# Patient Record
Sex: Female | Born: 1935 | Race: White | Hispanic: No | Smoking: Never smoker
Health system: Southern US, Community
[De-identification: ages and names within clinical notes are randomized; demographics above are authoritative.]

## PROBLEM LIST (undated history)

## (undated) DIAGNOSIS — M545 Low back pain, unspecified: Secondary | ICD-10-CM

## (undated) DIAGNOSIS — E785 Hyperlipidemia, unspecified: Secondary | ICD-10-CM

## (undated) DIAGNOSIS — I10 Essential (primary) hypertension: Secondary | ICD-10-CM

## (undated) DIAGNOSIS — F329 Major depressive disorder, single episode, unspecified: Secondary | ICD-10-CM

## (undated) DIAGNOSIS — F32A Depression, unspecified: Secondary | ICD-10-CM

## (undated) DIAGNOSIS — F419 Anxiety disorder, unspecified: Secondary | ICD-10-CM

## (undated) DIAGNOSIS — M8580 Other specified disorders of bone density and structure, unspecified site: Secondary | ICD-10-CM

## (undated) DIAGNOSIS — I491 Atrial premature depolarization: Secondary | ICD-10-CM

## (undated) DIAGNOSIS — H3530 Unspecified macular degeneration: Secondary | ICD-10-CM

## (undated) DIAGNOSIS — T7840XA Allergy, unspecified, initial encounter: Secondary | ICD-10-CM

## (undated) DIAGNOSIS — K219 Gastro-esophageal reflux disease without esophagitis: Secondary | ICD-10-CM

## (undated) DIAGNOSIS — K5792 Diverticulitis of intestine, part unspecified, without perforation or abscess without bleeding: Secondary | ICD-10-CM

## (undated) HISTORY — DX: Low back pain: M54.5

## (undated) HISTORY — DX: Other specified disorders of bone density and structure, unspecified site: M85.80

## (undated) HISTORY — DX: Essential (primary) hypertension: I10

## (undated) HISTORY — DX: Gastro-esophageal reflux disease without esophagitis: K21.9

## (undated) HISTORY — DX: Hyperlipidemia, unspecified: E78.5

## (undated) HISTORY — DX: Major depressive disorder, single episode, unspecified: F32.9

## (undated) HISTORY — PX: BACK SURGERY: SHX140

## (undated) HISTORY — DX: Depression, unspecified: F32.A

## (undated) HISTORY — DX: Atrial premature depolarization: I49.1

## (undated) HISTORY — DX: Anxiety disorder, unspecified: F41.9

## (undated) HISTORY — DX: Low back pain, unspecified: M54.50

## (undated) HISTORY — DX: Allergy, unspecified, initial encounter: T78.40XA

## (undated) HISTORY — DX: Diverticulitis of intestine, part unspecified, without perforation or abscess without bleeding: K57.92

---

## 1998-08-14 ENCOUNTER — Other Ambulatory Visit: Admission: RE | Admit: 1998-08-14 | Discharge: 1998-08-14 | Payer: Self-pay | Admitting: *Deleted

## 2000-03-28 ENCOUNTER — Other Ambulatory Visit: Admission: RE | Admit: 2000-03-28 | Discharge: 2000-03-28 | Payer: Self-pay | Admitting: *Deleted

## 2003-01-16 ENCOUNTER — Encounter: Payer: Self-pay | Admitting: Emergency Medicine

## 2003-01-16 ENCOUNTER — Emergency Department (HOSPITAL_COMMUNITY): Admission: EM | Admit: 2003-01-16 | Discharge: 2003-01-17 | Payer: Self-pay | Admitting: Emergency Medicine

## 2003-01-20 ENCOUNTER — Emergency Department (HOSPITAL_COMMUNITY): Admission: EM | Admit: 2003-01-20 | Discharge: 2003-01-20 | Payer: Self-pay | Admitting: Emergency Medicine

## 2003-01-26 ENCOUNTER — Encounter: Payer: Self-pay | Admitting: Internal Medicine

## 2003-01-26 ENCOUNTER — Ambulatory Visit (HOSPITAL_COMMUNITY): Admission: RE | Admit: 2003-01-26 | Discharge: 2003-01-26 | Payer: Self-pay | Admitting: Internal Medicine

## 2003-08-12 ENCOUNTER — Other Ambulatory Visit: Admission: RE | Admit: 2003-08-12 | Discharge: 2003-08-12 | Payer: Self-pay | Admitting: Gynecology

## 2004-08-02 HISTORY — PX: CHOLECYSTECTOMY: SHX55

## 2005-02-01 ENCOUNTER — Ambulatory Visit: Payer: Self-pay | Admitting: Internal Medicine

## 2005-02-16 ENCOUNTER — Ambulatory Visit: Payer: Self-pay | Admitting: Internal Medicine

## 2005-03-03 ENCOUNTER — Ambulatory Visit: Payer: Self-pay | Admitting: Internal Medicine

## 2005-03-04 ENCOUNTER — Ambulatory Visit: Payer: Self-pay | Admitting: Internal Medicine

## 2005-03-05 ENCOUNTER — Ambulatory Visit: Payer: Self-pay | Admitting: Internal Medicine

## 2005-04-15 ENCOUNTER — Ambulatory Visit: Payer: Self-pay | Admitting: Internal Medicine

## 2005-05-28 ENCOUNTER — Ambulatory Visit: Payer: Self-pay | Admitting: Internal Medicine

## 2005-08-11 ENCOUNTER — Ambulatory Visit (HOSPITAL_COMMUNITY): Admission: RE | Admit: 2005-08-11 | Discharge: 2005-08-12 | Payer: Self-pay | Admitting: Surgery

## 2005-08-11 ENCOUNTER — Encounter (INDEPENDENT_AMBULATORY_CARE_PROVIDER_SITE_OTHER): Payer: Self-pay | Admitting: Specialist

## 2005-09-01 ENCOUNTER — Encounter: Admission: RE | Admit: 2005-09-01 | Discharge: 2005-09-01 | Payer: Self-pay | Admitting: Surgery

## 2005-10-25 ENCOUNTER — Ambulatory Visit: Payer: Self-pay | Admitting: Internal Medicine

## 2006-01-03 ENCOUNTER — Ambulatory Visit: Payer: Self-pay | Admitting: Internal Medicine

## 2006-01-31 ENCOUNTER — Other Ambulatory Visit: Admission: RE | Admit: 2006-01-31 | Discharge: 2006-01-31 | Payer: Self-pay | Admitting: Gynecology

## 2006-05-24 ENCOUNTER — Ambulatory Visit: Payer: Self-pay | Admitting: Internal Medicine

## 2006-05-24 LAB — CONVERTED CEMR LAB
Albumin: 3.7 g/dL (ref 3.5–5.2)
CO2: 29 meq/L (ref 19–32)
Chol/HDL Ratio, serum: 3.5
Creatinine, Ser: 1 mg/dL (ref 0.4–1.2)
GFR calc non Af Amer: 57 mL/min
Ketones, ur: NEGATIVE mg/dL
LDL Cholesterol: 79 mg/dL (ref 0–99)
Nitrite: NEGATIVE
Sodium: 142 meq/L (ref 135–145)
Specific Gravity, Urine: 1.01 (ref 1.000–1.03)
Total Bilirubin: 1 mg/dL (ref 0.3–1.2)
Total Protein, Urine: NEGATIVE mg/dL
Urine Glucose: NEGATIVE mg/dL

## 2006-06-07 ENCOUNTER — Ambulatory Visit: Payer: Self-pay | Admitting: Internal Medicine

## 2006-08-02 HISTORY — PX: TOTAL HIP ARTHROPLASTY: SHX124

## 2006-10-14 ENCOUNTER — Ambulatory Visit: Payer: Self-pay | Admitting: Internal Medicine

## 2006-11-16 ENCOUNTER — Inpatient Hospital Stay (HOSPITAL_COMMUNITY): Admission: RE | Admit: 2006-11-16 | Discharge: 2006-11-19 | Payer: Self-pay | Admitting: Orthopedic Surgery

## 2007-05-02 ENCOUNTER — Ambulatory Visit: Payer: Self-pay | Admitting: Internal Medicine

## 2007-07-28 ENCOUNTER — Encounter: Payer: Self-pay | Admitting: Internal Medicine

## 2007-09-07 ENCOUNTER — Telehealth: Payer: Self-pay | Admitting: Internal Medicine

## 2007-09-26 ENCOUNTER — Ambulatory Visit: Payer: Self-pay | Admitting: Internal Medicine

## 2007-09-26 DIAGNOSIS — I10 Essential (primary) hypertension: Secondary | ICD-10-CM | POA: Insufficient documentation

## 2007-09-26 DIAGNOSIS — M1990 Unspecified osteoarthritis, unspecified site: Secondary | ICD-10-CM | POA: Insufficient documentation

## 2007-09-26 DIAGNOSIS — M949 Disorder of cartilage, unspecified: Secondary | ICD-10-CM

## 2007-09-26 DIAGNOSIS — M545 Low back pain: Secondary | ICD-10-CM

## 2007-09-26 DIAGNOSIS — M899 Disorder of bone, unspecified: Secondary | ICD-10-CM | POA: Insufficient documentation

## 2007-09-26 DIAGNOSIS — F4321 Adjustment disorder with depressed mood: Secondary | ICD-10-CM

## 2007-11-15 ENCOUNTER — Encounter: Admission: RE | Admit: 2007-11-15 | Discharge: 2007-11-15 | Payer: Self-pay | Admitting: Orthopedic Surgery

## 2007-12-04 ENCOUNTER — Ambulatory Visit: Payer: Self-pay | Admitting: Internal Medicine

## 2008-01-02 ENCOUNTER — Ambulatory Visit: Payer: Self-pay | Admitting: Internal Medicine

## 2008-01-02 DIAGNOSIS — E785 Hyperlipidemia, unspecified: Secondary | ICD-10-CM | POA: Insufficient documentation

## 2008-01-02 DIAGNOSIS — F411 Generalized anxiety disorder: Secondary | ICD-10-CM | POA: Insufficient documentation

## 2008-01-02 DIAGNOSIS — K219 Gastro-esophageal reflux disease without esophagitis: Secondary | ICD-10-CM

## 2008-01-23 ENCOUNTER — Inpatient Hospital Stay (HOSPITAL_COMMUNITY): Admission: RE | Admit: 2008-01-23 | Discharge: 2008-01-26 | Payer: Self-pay | Admitting: Orthopedic Surgery

## 2008-04-02 ENCOUNTER — Ambulatory Visit: Payer: Self-pay | Admitting: Internal Medicine

## 2008-04-26 ENCOUNTER — Ambulatory Visit: Payer: Self-pay | Admitting: Internal Medicine

## 2008-08-21 ENCOUNTER — Encounter: Payer: Self-pay | Admitting: Internal Medicine

## 2008-09-23 ENCOUNTER — Ambulatory Visit: Payer: Self-pay | Admitting: Internal Medicine

## 2008-09-25 LAB — CONVERTED CEMR LAB
Alkaline Phosphatase: 63 units/L (ref 39–117)
Basophils Absolute: 0 10*3/uL (ref 0.0–0.1)
Bilirubin Urine: NEGATIVE
Bilirubin, Direct: 0.2 mg/dL (ref 0.0–0.3)
Eosinophils Absolute: 0.1 10*3/uL (ref 0.0–0.7)
GFR calc Af Amer: 78 mL/min
GFR calc non Af Amer: 65 mL/min
Glucose, Bld: 108 mg/dL — ABNORMAL HIGH (ref 70–99)
HCT: 38.7 % (ref 36.0–46.0)
HDL: 56.3 mg/dL (ref >39.0)
Hemoglobin, Urine: NEGATIVE
MCHC: 35.1 g/dL (ref 30.0–36.0)
Monocytes Absolute: 0.5 10*3/uL (ref 0.1–1.0)
Monocytes Relative: 7.6 % (ref 3.0–12.0)
Nitrite: NEGATIVE
Platelets: 216 10*3/uL (ref 150–400)
Potassium: 3.6 meq/L (ref 3.5–5.1)
RDW: 11.7 % (ref 11.5–14.6)
Sodium: 142 meq/L (ref 135–145)
TSH: 3.05 microintl units/mL (ref 0.35–5.50)
Total Bilirubin: 1.2 mg/dL (ref 0.3–1.2)
Total CHOL/HDL Ratio: 2.8
Total Protein, Urine: NEGATIVE mg/dL
Triglycerides: 284 mg/dL (ref 0–149)

## 2008-09-26 ENCOUNTER — Ambulatory Visit: Payer: Self-pay | Admitting: Internal Medicine

## 2008-09-26 DIAGNOSIS — J309 Allergic rhinitis, unspecified: Secondary | ICD-10-CM | POA: Insufficient documentation

## 2008-10-21 ENCOUNTER — Encounter: Payer: Self-pay | Admitting: Internal Medicine

## 2008-10-24 ENCOUNTER — Telehealth (INDEPENDENT_AMBULATORY_CARE_PROVIDER_SITE_OTHER): Payer: Self-pay | Admitting: *Deleted

## 2009-01-20 ENCOUNTER — Telehealth: Payer: Self-pay | Admitting: Internal Medicine

## 2009-02-04 ENCOUNTER — Encounter: Payer: Self-pay | Admitting: Internal Medicine

## 2009-02-04 ENCOUNTER — Telehealth: Payer: Self-pay | Admitting: Internal Medicine

## 2009-02-05 ENCOUNTER — Encounter: Payer: Self-pay | Admitting: Internal Medicine

## 2009-03-20 ENCOUNTER — Ambulatory Visit: Payer: Self-pay | Admitting: Internal Medicine

## 2009-03-20 DIAGNOSIS — T887XXA Unspecified adverse effect of drug or medicament, initial encounter: Secondary | ICD-10-CM | POA: Insufficient documentation

## 2009-03-20 LAB — CONVERTED CEMR LAB
AST: 27 units/L (ref 0–37)
Albumin: 4.1 g/dL (ref 3.5–5.2)
Alkaline Phosphatase: 67 units/L (ref 39–117)
Bilirubin, Direct: 0.2 mg/dL (ref 0.0–0.3)
CO2: 27 meq/L (ref 19–32)
Chloride: 99 meq/L (ref 96–112)
Glucose, Bld: 91 mg/dL (ref 70–99)
LDL Cholesterol: 76 mg/dL (ref 0–99)
Potassium: 4.1 meq/L (ref 3.5–5.1)
Sodium: 135 meq/L (ref 135–145)
Total Protein: 7.2 g/dL (ref 6.0–8.3)
VLDL: 22 mg/dL (ref 0.0–40.0)

## 2009-03-25 ENCOUNTER — Ambulatory Visit: Payer: Self-pay | Admitting: Internal Medicine

## 2009-05-27 ENCOUNTER — Ambulatory Visit: Payer: Self-pay | Admitting: Internal Medicine

## 2009-08-28 ENCOUNTER — Encounter: Payer: Self-pay | Admitting: Internal Medicine

## 2009-09-17 ENCOUNTER — Ambulatory Visit: Payer: Self-pay | Admitting: Internal Medicine

## 2009-09-17 LAB — CONVERTED CEMR LAB
ALT: 17 units/L (ref 0–35)
BUN: 10 mg/dL (ref 6–23)
Bilirubin, Direct: 0.2 mg/dL (ref 0.0–0.3)
GFR calc non Af Amer: 64.25 mL/min (ref >60)
Glucose, Bld: 94 mg/dL (ref 70–99)
LDL Cholesterol: 65 mg/dL (ref 0–99)
Potassium: 4.1 meq/L (ref 3.5–5.1)
Total Bilirubin: 1.1 mg/dL (ref 0.3–1.2)
VLDL: 23.4 mg/dL (ref 0.0–40.0)

## 2009-09-23 ENCOUNTER — Ambulatory Visit: Payer: Self-pay | Admitting: Internal Medicine

## 2010-03-17 ENCOUNTER — Ambulatory Visit: Payer: Self-pay | Admitting: Internal Medicine

## 2010-03-17 LAB — CONVERTED CEMR LAB
ALT: 13 units/L (ref 0–35)
AST: 18 units/L (ref 0–37)
BUN: 16 mg/dL (ref 6–23)
Bilirubin Urine: NEGATIVE
Bilirubin, Direct: 0.1 mg/dL (ref 0.0–0.3)
Calcium: 9.1 mg/dL (ref 8.4–10.5)
Cholesterol: 135 mg/dL (ref 0–200)
Creatinine, Ser: 0.9 mg/dL (ref 0.4–1.2)
Eosinophils Relative: 3.9 % (ref 0.0–5.0)
GFR calc non Af Amer: 64.17 mL/min (ref >60)
Ketones, ur: NEGATIVE mg/dL
LDL Cholesterol: 59 mg/dL (ref 0–99)
Monocytes Relative: 9.8 % (ref 3.0–12.0)
Neutrophils Relative %: 52.6 % (ref 43.0–77.0)
Nitrite: NEGATIVE
Platelets: 171 10*3/uL (ref 150.0–400.0)
Total Bilirubin: 0.9 mg/dL (ref 0.3–1.2)
Total Protein, Urine: NEGATIVE mg/dL
Triglycerides: 130 mg/dL (ref 0.0–149.0)
VLDL: 26 mg/dL (ref 0.0–40.0)
WBC: 4.6 10*3/uL (ref 4.5–10.5)
pH: 7 (ref 5.0–8.0)

## 2010-03-24 ENCOUNTER — Ambulatory Visit: Payer: Self-pay | Admitting: Internal Medicine

## 2010-03-24 DIAGNOSIS — L570 Actinic keratosis: Secondary | ICD-10-CM | POA: Insufficient documentation

## 2010-05-12 ENCOUNTER — Ambulatory Visit: Payer: Self-pay | Admitting: Internal Medicine

## 2010-05-28 ENCOUNTER — Telehealth: Payer: Self-pay | Admitting: Internal Medicine

## 2010-08-12 ENCOUNTER — Telehealth: Payer: Self-pay | Admitting: Internal Medicine

## 2010-08-31 ENCOUNTER — Encounter: Payer: Self-pay | Admitting: Internal Medicine

## 2010-09-01 NOTE — Assessment & Plan Note (Signed)
Summary: FLU SHOT/PN   Nurse Visit   Allergies: 1)  Ace Inhibitors  Orders Added: 1)  Flu Vaccine 42yrs + MEDICARE PATIENTS [Q2039] 2)  Administration Flu vaccine - MCR [G0008] Flu Vaccine Consent Questions     Do you have a history of severe allergic reactions to this vaccine? no    Any prior history of allergic reactions to egg and/or gelatin? no    Do you have a sensitivity to the preservative Thimersol? no    Do you have a past history of Guillan-Barre Syndrome? no    Do you currently have an acute febrile illness? no    Have you ever had a severe reaction to latex? no    Vaccine information given and explained to patient? yes    Are you currently pregnant? no    Lot Number:[id number]   Exp Date:01/30/2011   Site Given  Left Deltoid IMistration Flu vaccine - MCR [G0008] .lbmedflu

## 2010-09-01 NOTE — Assessment & Plan Note (Signed)
Summary: 6 MO ROV /NWS  #   Vital Signs:  Patient profile:   77 year old female Height:      69 inches Weight:      179 pounds BMI:     26.53 O2 Sat:      94 % on Room air Temp:     97.7 degrees F oral Pulse rate:   87 / minute Pulse rhythm:   regular Resp:     16 per minute BP sitting:   108 / 80  (left arm) Cuff size:   regular  Vitals Entered By: Lanier Prude, CMA(AAMA) (March 24, 2010 10:11 AM)  O2 Flow:  Room air CC: 6 mo f/u Is Patient Diabetic? No   CC:  6 mo f/u.  History of Present Illness: The patient presents for a follow up of hypertension, osteoporhosis, hyperlipidemia, anxiety.   Current Medications (verified): 1)  Evista 60 Mg  Tabs (Raloxifene Hcl) .... Once Daily 2)  Simvastatin 10 Mg Tabs (Simvastatin) .Marland Kitchen.. 1 By Mouth At Bedtime 3)  Hydrochlorothiazide 12.5 Mg  Tabs (Hydrochlorothiazide) .... Take 1 Tab  By Mouth Every Morning 4)  Tranxene-T 7.5 Mg  Tabs (Clorazepate Dipotassium) .Marland Kitchen.. 1 By Mouth Tid 5)  Aspirin 81 Mg Tabs (Aspirin) .... Once Daily 6)  Vitamin D3 1000 Unit  Tabs (Cholecalciferol) .Marland Kitchen.. 1 By Mouth Daily 7)  Fluticasone Propionate 50 Mcg/act  Susp (Fluticasone Propionate) .... 2 Sprays Each Nostril Once Daily 8)  Benicar 20 Mg Tabs (Olmesartan Medoxomil) .Marland Kitchen.. 1 Once Daily Pa Approved 9)  Triamcinolone Acetonide 0.5 % Crea (Triamcinolone Acetonide) .... Apply Bid To Qid To Affected Area 10)  Tessalon 200 Mg Caps (Benzonatate) .Marland Kitchen.. 1 By Mouth Three Times A Day As Needed Cough 11)  Magnesium Gluconate 550 Mg Tabs (Magnesium Gluconate) .... Once Daily 12)  Preservision/lutein  Caps (Multiple Vitamins-Minerals) .Marland Kitchen.. 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    Physical Exam  General:  Well-developed,well-nourished,in no acute distress; alert,appropriate and cooperative throughout examination Eyes:  No corneal or conjunctival inflammation noted. EOMI. Perrla. Funduscopic exam benign, without hemorrhages, exudates or papilledema. Vision grossly normal. Nose:  External nasal examination shows no deformity or inflammation. Nasal mucosa are pink and moist without lesions or exudates. Mouth:  Oral mucosa and oropharynx without lesions or exudates.  Teeth in good repair. Lungs:  Normal respiratory effort, chest expands symmetrically. Lungs are clear to auscultation, no crackles or wheezes. Heart:  Normal rate and regular rhythm. S1 and S2 normal without gallop, murmur, click, rub or other extra sounds. Abdomen:  Bowel sounds positive,abdomen soft and non-tender without masses, organomegaly or hernias noted. Msk:  No deformity or scoliosis noted of thoracic or lumbar spine.   Pulses:  R and L carotid,radial,femoral,dorsalis pedis and posterior tibial pulses are full and equal bilaterally Extremities:  No clubbing, cyanosis, edema, or deformity noted with normal full range of motion of all joints.   Neurologic:  No cranial nerve deficits noted. Station and gait are normal. Plantar  reflexes are down-going bilaterally. DTRs are symmetrical throughout. Sensory, motor and coordinative functions appear intact. Skin:  AK on L hand Psych:  Cognition and judgment appear intact. Alert and cooperative with normal attention span and concentration. No apparent delusions, illusions, hallucinations   Impression &  Recommendations:  Problem # 1:  HYPERLIPIDEMIA (ICD-272.4) Assessment Unchanged  Her updated medication list for this problem includes:    Simvastatin 10 Mg Tabs (Simvastatin) .Marland Kitchen... 1 by mouth at bedtime  Problem # 2:  ANXIETY (ICD-300.00) Assessment: Unchanged  Her updated medication list for this problem includes:    Tranxene-t 7.5 Mg Tabs (Clorazepate dipotassium) .Marland Kitchen... 1 by mouth tid  Problem # 3:  HYPERTENSION (ICD-401.9) Assessment: Unchanged  Her updated medication list for this problem includes:    Hydrochlorothiazide 12.5 Mg Tabs (Hydrochlorothiazide) .Marland Kitchen... Take 1 tab  by mouth every morning    Benicar 20 Mg Tabs (Olmesartan medoxomil) .Marland Kitchen... 1 once daily pa approved  Problem # 4:  LOW BACK PAIN (ICD-724.2) Assessment: Unchanged  Her updated medication list for this problem includes:    Aspirin 81 Mg Tabs (Aspirin) ..... Once daily  Problem # 5:  DEPRESSION (ICD-311) Assessment: Unchanged  Her updated medication list for this problem includes:    Tranxene-t 7.5 Mg Tabs (Clorazepate dipotassium) .Marland Kitchen... 1 by mouth tid  Problem # 6:  SOLAR KERATOSIS (ICD-702.0) L hand Assessment: Deteriorated Procedure: cryo Indication: AK(s) Risks incl. scar(s), incomplete removal, ect.  and benefits discussed    1  lesion(s) onL hand was/were treated with liqid N2 in usual fasion.  Tolerated well. Compl. none. Wound care instructions given.   Complete Medication List: 1)  Evista 60 Mg Tabs (Raloxifene hcl) .... Once daily 2)  Simvastatin 10 Mg Tabs (Simvastatin) .Marland Kitchen.. 1 by mouth at bedtime 3)  Hydrochlorothiazide 12.5 Mg Tabs (Hydrochlorothiazide) .... Take 1 tab  by mouth every morning 4)  Tranxene-t 7.5 Mg Tabs (Clorazepate dipotassium) .Marland Kitchen.. 1 by mouth tid 5)  Aspirin 81 Mg Tabs (Aspirin) .... Once daily 6)  Vitamin D3 1000 Unit Tabs (Cholecalciferol) .Marland Kitchen.. 1 by mouth daily 7)  Fluticasone Propionate 50 Mcg/act Susp (Fluticasone propionate) .... 2 sprays each nostril once  daily 8)  Benicar 20 Mg Tabs (Olmesartan medoxomil) .Marland Kitchen.. 1 once daily pa approved 9)  Triamcinolone Acetonide 0.5 % Crea (Triamcinolone acetonide) .... Apply bid to qid to affected area 10)  Magnesium Gluconate 550 Mg Tabs (Magnesium gluconate) .... Once daily 11)  Preservision/lutein Caps (Multiple vitamins-minerals) .Marland Kitchen.. 1 two times a day  Other Orders: Cryotherapy/Destruction benign or premalignant lesion (1st lesion)  (17000)  Patient Instructions: 1)  Please schedule a follow-up appointment in 6 months well w/labs. Prescriptions: BENICAR 20 MG TABS (OLMESARTAN MEDOXOMIL) 1 once daily PA APPROVED  #90 x 3   Entered and Authorized by:   Tresa Garter MD   Signed by:   Tresa Garter MD on 03/24/2010   Method used:   Print then Give to Patient   RxID:   [id number] TRIAMCINOLONE ACETONIDE 0.5 % CREA (TRIAMCINOLONE ACETONIDE) apply bid to qid to affected area  #45 g x 1   Entered and Authorized by:   Tresa Garter MD   Signed by:   Tresa Garter MD on 03/24/2010   Method used:   Print then Give to Patient   RxID:   [id number] TRANXENE-T 7.5 MG  TABS (CLORAZEPATE DIPOTASSIUM) 1 by mouth tid  #270 x 1   Entered and Authorized by:   Tresa Garter MD  Signed by:   Tresa Garter MD on 03/24/2010   Method used:   Print then Give to Patient   RxID:   [id number] HYDROCHLOROTHIAZIDE 12.5 MG  TABS (HYDROCHLOROTHIAZIDE) Take 1 tab  by mouth every morning  #90 x 3   Entered and Authorized by:   Tresa Garter MD   Signed by:   Tresa Garter MD on 03/24/2010   Method used:   Print then Give to Patient   RxID:   [id number] SIMVASTATIN 10 MG TABS (SIMVASTATIN) 1 by mouth at bedtime  #90 x 3   Entered and Authorized by:   Tresa Garter MD   Signed by:   Tresa Garter MD on 03/24/2010   Method used:   Print then Give to Patient   RxID:   [id number] EVISTA 60 MG  TABS (RALOXIFENE HCL) once daily  #90 x 3    Entered and Authorized by:   Tresa Garter MD   Signed by:   Tresa Garter MD on 03/24/2010   Method used:   Print then Give to Patient   RxID:   [id number]

## 2010-09-01 NOTE — Assessment & Plan Note (Signed)
Summary: 6 MTH FU  STC   Vital Signs:  Patient profile:   77 year old female Weight:      181 pounds Temp:     96.6 degrees F oral Pulse rate:   72 / minute BP sitting:   100 / 72  (left arm)  Vitals Entered By: Tora Perches (September 23, 2009 9:51 AM) CC: f/u Is Patient Diabetic? No   CC:  f/u.  History of Present Illness: F/u HTN  Preventive Screening-Counseling & Management  Alcohol-Tobacco     Smoking Status: never  Current Medications (verified): 1)  Evista 60 Mg  Tabs (Raloxifene Hcl) .... Once Daily 2)  Simvastatin 10 Mg Tabs (Simvastatin) .Marland Kitchen.. 1 By Mouth At Bedtime 3)  Hydrochlorothiazide 12.5 Mg  Tabs (Hydrochlorothiazide) .... Take 1 Tab  By Mouth Every Morning 4)  Tranxene-T 7.5 Mg  Tabs (Clorazepate Dipotassium) .Marland Kitchen.. 1 By Mouth Tid 5)  Aspirin 81 Mg Tabs (Aspirin) .... Once Daily 6)  Vitamin D3 1000 Unit  Tabs (Cholecalciferol) .Marland Kitchen.. 1 By Mouth Daily 7)  Fluticasone Propionate 50 Mcg/act  Susp (Fluticasone Propionate) .... 2 Sprays Each Nostril Once Daily 8)  Benicar 20 Mg Tabs (Olmesartan Medoxomil) .Marland Kitchen.. 1 Once Daily Pa Approved 9)  Triamcinolone Acetonide 0.5 % Crea (Triamcinolone Acetonide) .... Apply Bid To Qid To Affected Area 10)  Tessalon 200 Mg Caps (Benzonatate) .Marland Kitchen.. 1 By Mouth Three Times A Day As Needed Cough 11)  Magnesium Gluconate 550 Mg Tabs (Magnesium Gluconate) .... 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    Physical Exam  General:  Well-developed,well-nourished,in no acute distress; alert,appropriate and cooperative throughout examination Nose:  External  nasal examination shows no deformity or inflammation. Nasal mucosa are pink and moist without lesions or exudates. Mouth:  Oral mucosa and oropharynx without lesions or exudates.  Teeth in good repair. Lungs:  Normal respiratory effort, chest expands symmetrically. Lungs are clear to auscultation, no crackles or wheezes. Heart:  Normal rate and regular rhythm. S1 and S2 normal without gallop, murmur, click, rub or other extra sounds. Abdomen:  Bowel sounds positive,abdomen soft and non-tender without masses, organomegaly or hernias noted. Msk:  No deformity or scoliosis noted of thoracic or lumbar spine.   Neurologic:  No cranial nerve deficits noted. Station and gait are normal. Plantar reflexes are down-going bilaterally. DTRs are symmetrical throughout. Sensory, motor and coordinative functions appear intact. Skin:  Intact without suspicious lesions or rashes Psych:  Cognition and judgment appear intact. Alert and cooperative with normal attention span and concentration. No apparent delusions, illusions, hallucinations   Impression & Recommendations:  Problem # 1:  LOW BACK PAIN (ICD-724.2) Assessment Unchanged  Her updated medication list for this problem includes:    Aspirin 81 Mg Tabs (Aspirin) ..... Once daily  Problem # 2:  HYPERLIPIDEMIA (ICD-272.4) Assessment: Unchanged  Her updated medication list for this problem includes:    Simvastatin 10 Mg Tabs (Simvastatin) .Marland Kitchen... 1 by mouth at bedtime  Problem # 3:  HYPERTENSION (ICD-401.9) Assessment: Unchanged  Her updated medication list for this problem includes:    Hydrochlorothiazide 12.5 Mg Tabs (Hydrochlorothiazide) .Marland Kitchen... Take 1 tab  by mouth every morning    Benicar  20 Mg Tabs (Olmesartan medoxomil) .Marland Kitchen... 1 once daily pa approved  Problem # 4:  DEPRESSION (ICD-311) Assessment: Unchanged  Her updated medication list for this problem includes:    Tranxene-t 7.5 Mg Tabs (Clorazepate dipotassium) .Marland Kitchen... 1 by mouth  tid  Complete Medication List: 1)  Evista 60 Mg Tabs (Raloxifene hcl) .... Once daily 2)  Simvastatin 10 Mg Tabs (Simvastatin) .Marland Kitchen.. 1 by mouth at bedtime 3)  Hydrochlorothiazide 12.5 Mg Tabs (Hydrochlorothiazide) .... Take 1 tab  by mouth every morning 4)  Tranxene-t 7.5 Mg Tabs (Clorazepate dipotassium) .Marland Kitchen.. 1 by mouth tid 5)  Aspirin 81 Mg Tabs (Aspirin) .... Once daily 6)  Vitamin D3 1000 Unit Tabs (Cholecalciferol) .Marland Kitchen.. 1 by mouth daily 7)  Fluticasone Propionate 50 Mcg/act Susp (Fluticasone propionate) .... 2 sprays each nostril once daily 8)  Benicar 20 Mg Tabs (Olmesartan medoxomil) .Marland Kitchen.. 1 once daily pa approved 9)  Triamcinolone Acetonide 0.5 % Crea (Triamcinolone acetonide) .... Apply bid to qid to affected area 10)  Tessalon 200 Mg Caps (Benzonatate) .Marland Kitchen.. 1 by mouth three times a day as needed cough 11)  Magnesium Gluconate 550 Mg Tabs (Magnesium gluconate) .... Once daily  Patient Instructions: 1)  Please schedule a follow-up appointment in 6 months well w/labs. 2)  Use stretching exercises that I have provided (15 min. or longer every day)  Prescriptions: TRIAMCINOLONE ACETONIDE 0.5 % CREA (TRIAMCINOLONE ACETONIDE) apply bid to qid to affected area  #45 g x 1   Entered and Authorized by:   Tresa Garter MD   Signed by:   Tresa Garter MD on 09/23/2009   Method used:   Print then Give to Patient   RxID:   [id number] BENICAR 20 MG TABS (OLMESARTAN MEDOXOMIL) 1 once daily PA APPROVED  #90 x 3   Entered and Authorized by:   Tresa Garter MD   Signed by:   Tresa Garter MD on 09/23/2009   Method used:   Print then Give to Patient   RxID:   [id number] FLUTICASONE PROPIONATE 50 MCG/ACT  SUSP (FLUTICASONE PROPIONATE) 2 sprays each nostril once daily  #3 x 3   Entered and Authorized by:   Tresa Garter MD   Signed by:   Tresa Garter MD on 09/23/2009   Method used:   Print then Give to Patient   RxID:   [id number] TRANXENE-T  7.5 MG  TABS (CLORAZEPATE DIPOTASSIUM) 1 by mouth tid  #270 x 1   Entered and Authorized by:   Tresa Garter MD   Signed by:   Tresa Garter MD on 09/23/2009   Method used:   Print then Give to Patient   RxID:   [id number] HYDROCHLOROTHIAZIDE 12.5 MG  TABS (HYDROCHLOROTHIAZIDE) Take 1 tab  by mouth every morning  #90 x 3   Entered and Authorized by:   Tresa Garter MD   Signed by:   Tresa Garter MD on 09/23/2009   Method used:   Print then Give to Patient   RxID:   [id number] SIMVASTATIN 10 MG TABS (SIMVASTATIN) 1 by mouth at bedtime  #90 x 3   Entered and Authorized by:   Tresa Garter MD   Signed by:   Tresa Garter MD on 09/23/2009   Method used:   Print then Give to Patient   RxID:   [id number] EVISTA 60 MG  TABS (RALOXIFENE HCL) once daily  #90 x 3   Entered and Authorized by:  Tresa Garter MD   Signed by:   Tresa Garter MD on 09/23/2009   Method used:   Print then Give to Patient   RxID:   [id number]

## 2010-09-01 NOTE — Progress Notes (Signed)
Summary: REQ FOR RX  Phone Note Call from Patient Call back at [phone]   Caller: Spouse-Ron Summary of Call: Patient husband called stating that pt has a cough, sore throat and fever over 100 degrees. He is requesting that MD send in  zpak.Marland KitchenMarland KitchenAlvy Beal Archie CMA  May 28, 2010 8:45 AM   Follow-up for Phone Call        ok Follow-up by: Tresa Garter MD,  May 28, 2010 1:26 PM  Additional Follow-up for Phone Call Additional follow up Details #1::        Pt's husband informed.  Additional Follow-up by: Lamar Sprinkles, CMA,  May 28, 2010 4:39 PM    New/Updated Medications: ZITHROMAX Z-PAK 250 MG TABS (AZITHROMYCIN) as dirrected Prescriptions: ZITHROMAX Z-PAK 250 MG TABS (AZITHROMYCIN) as dirrected  #1 x 0   Entered and Authorized by:   Tresa Garter MD   Signed by:   Lamar Sprinkles, CMA on 05/28/2010   Method used:   Electronically to        [facility]  [address] [phone]* (retail)       [address]       [place], [place]  [address]       Ph: [phone] or [phone]       Fax: [phone]   RxID:   [id number]

## 2010-09-03 NOTE — Progress Notes (Signed)
Summary: REQ FOR RX  Phone Note Call from Patient   Caller: [phone] - husband Summary of Call: Pt c/o sinus infections - sinus pain, pressure & dizzyness. Patient is requesting rx for zpak.  Initial call taken by: Lamar Sprinkles, CMA,  August 12, 2010 8:53 AM  Follow-up for Phone Call        ok Follow-up by: Tresa Garter MD,  August 12, 2010 12:47 PM  Additional Follow-up for Phone Call Additional follow up Details #1::        Pt informed  Additional Follow-up by: Lamar Sprinkles, CMA,  August 12, 2010 4:24 PM    New/Updated Medications: ZITHROMAX Z-PAK 250 MG TABS (AZITHROMYCIN) as dirrected Prescriptions: ZITHROMAX Z-PAK 250 MG TABS (AZITHROMYCIN) as dirrected  #1 x 0   Entered and Authorized by:   Tresa Garter MD   Signed by:   Lamar Sprinkles, CMA on 08/12/2010   Method used:   Electronically to        [facility]  [address] [phone]* (retail)       [address]       [place], [place]  [address]       Ph: [phone] or [phone]       Fax: [phone]   RxID:   [id number]

## 2010-09-07 ENCOUNTER — Telehealth: Payer: Self-pay | Admitting: Internal Medicine

## 2010-09-09 ENCOUNTER — Ambulatory Visit (INDEPENDENT_AMBULATORY_CARE_PROVIDER_SITE_OTHER): Payer: Medicare Other | Admitting: Internal Medicine

## 2010-09-09 ENCOUNTER — Other Ambulatory Visit: Payer: Self-pay | Admitting: Internal Medicine

## 2010-09-09 ENCOUNTER — Encounter: Payer: Self-pay | Admitting: Internal Medicine

## 2010-09-09 ENCOUNTER — Ambulatory Visit (INDEPENDENT_AMBULATORY_CARE_PROVIDER_SITE_OTHER)
Admission: RE | Admit: 2010-09-09 | Discharge: 2010-09-09 | Disposition: A | Discharge status: Home / Self Care | Payer: Medicare Other | Source: Ambulatory Visit | Attending: Internal Medicine | Admitting: Internal Medicine

## 2010-09-09 DIAGNOSIS — J069 Acute upper respiratory infection, unspecified: Secondary | ICD-10-CM | POA: Insufficient documentation

## 2010-09-09 DIAGNOSIS — J189 Pneumonia, unspecified organism: Secondary | ICD-10-CM

## 2010-09-09 DIAGNOSIS — D485 Neoplasm of uncertain behavior of skin: Secondary | ICD-10-CM | POA: Insufficient documentation

## 2010-09-17 NOTE — Progress Notes (Signed)
Summary: REQ FOR RX  Phone Note Call from Patient   Caller: Husband [phone] Summary of Call: Pt c/o body aches, fever, sinus congestion/pressure/pain. Patient is requesting rx for antibiotic.  Initial call taken by: Lamar Sprinkles, CMA,  September 07, 2010 11:04 AM  Follow-up for Phone Call        It is likely viral Use over-the-counter medicines for "cold": Tylenol  650mg  or Advil 400mg  every 6 hours  for fever; Delsym or Robutussin for cough. Mucinex for congestion. Ricola or Halls for sore throat. Office visit if not better or if worse.  Follow-up by: Tresa Garter MD,  September 07, 2010 6:05 PM  Additional Follow-up for Phone Call Additional follow up Details #1::        Pt informed, has been using tylenol. Will call back and schedule apt for office visit if not better.  Additional Follow-up by: Lamar Sprinkles, CMA,  September 07, 2010 6:23 PM

## 2010-09-17 NOTE — Assessment & Plan Note (Signed)
Summary: PER SPOUSE--FEVER  COUGH  STC   Vital Signs:  Patient profile:   77 year old female Height:      69 inches Weight:      179 pounds BMI:     26.53 Temp:     98.7 degrees F oral Pulse rate:   84 / minute Pulse rhythm:   regular Resp:     16 per minute BP sitting:   112 / 72  (left arm) Cuff size:   regular  Vitals Entered By: Lanier Prude, CMA(AAMA) (September 09, 2010 9:54 AM) CC: fever,cough, body aches X 5 days Is Patient Diabetic? No   CC:  fever, cough, and body aches X 5 days.  History of Present Illness: The patient presents with complaints of sore throat, fever, cough, sinus congestion and drainge of 5  days duration. Not better with OTC meds. Chest hurts with coughing. Can't sleep due to cough. Muscle aches are present.  The mucus is not  colored.   Current Medications (verified): 1)  Evista 60 Mg  Tabs (Raloxifene Hcl) .... Once Daily 2)  Simvastatin 10 Mg Tabs (Simvastatin) .Marland Kitchen.. 1 By Mouth At Bedtime 3)  Hydrochlorothiazide 12.5 Mg  Tabs (Hydrochlorothiazide) .... Take 1 Tab  By Mouth Every Morning 4)  Tranxene-T 7.5 Mg  Tabs (Clorazepate Dipotassium) .Marland Kitchen.. 1 By Mouth Tid 5)  Aspirin 81 Mg Tabs (Aspirin) .... Once Daily 6)  Vitamin D3 1000 Unit  Tabs (Cholecalciferol) .Marland Kitchen.. 1 By Mouth Daily 7)  Fluticasone Propionate 50 Mcg/act  Susp (Fluticasone Propionate) .... 2 Sprays Each Nostril Once Daily 8)  Benicar 20 Mg Tabs (Olmesartan Medoxomil) .Marland Kitchen.. 1 Once Daily Pa Approved 9)  Triamcinolone Acetonide 0.5 % Crea (Triamcinolone Acetonide) .... Apply Bid To Qid To Affected Area 10)  Magnesium Gluconate 550 Mg Tabs (Magnesium Gluconate) .... Once Daily 11)  Preservision/lutein  Caps (Multiple Vitamins-Minerals) .Marland Kitchen.. 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    Physical Exam  General:  Well-developed,well-nourished,in no acute distress; alert,appropriate and cooperative throughout examination. Looks tired Ears:  WNL Mouth:  Erythematous throat and intranasal mucosa c/w URI  Lungs:  crackles at R base Heart:  Normal rate and regular rhythm. S1 and S2 normal without gallop, murmur, click, rub or other extra sounds. Abdomen:  Bowel sounds positive,abdomen soft and non-tender without masses, organomegaly or hernias noted. Msk:  No deformity or scoliosis noted of thoracic or lumbar spine.   Neurologic:  No cranial nerve deficits noted. Station and gait are normal. Plantar reflexes are down-going bilaterally. DTRs are symmetrical throughout. Sensory, motor and coordinative functions appear intact. Skin:  L ear growth 3 mm Psych:  Oriented X3.     Impression & Recommendations:  Problem # 1:  UPPER RESPIRATORY INFECTION, ACUTE (ICD-465.9) Assessment New  Her updated medication list for this problem includes:    Aspirin 81 Mg Tabs (Aspirin) ..... Once daily    Tessalon Perles 100 Mg Caps (Benzonatate) .Marland Kitchen... 1-2 by mouth two times a day as needed cogh    Promethazine-codeine 6.25-10 Mg/37ml Syrp (Promethazine-codeine) .Marland Kitchen... 5-10 ml by mouth q id as needed cough  Orders: T-2 View CXR, Same Day (71020.5TC)  Problem # 2:  PNEUMONIA (ICD-486) clinically Assessment: New  Levaquin 500 mg qd x 10 d  Orders: T-2 View CXR, Same Day (71020.5TC) ordered and reviewed  Problem # 3:  NEOPLASM OF UNCERTAIN BEHAVIOR OF SKIN (ICD-238.2) L ear Assessment: New skin bx  Complete Medication List: 1)  Evista 60 Mg Tabs (Raloxifene hcl) .... Once daily 2)  Simvastatin 10 Mg Tabs (Simvastatin) .Marland Kitchen.. 1 by  mouth at bedtime 3)  Hydrochlorothiazide 12.5 Mg Tabs (Hydrochlorothiazide) .... Take 1 tab  by mouth every morning 4)  Tranxene-t 7.5 Mg Tabs (Clorazepate dipotassium) .Marland Kitchen.. 1 by mouth tid 5)  Aspirin 81 Mg Tabs (Aspirin) .... Once daily 6)  Vitamin D3 1000 Unit Tabs (Cholecalciferol) .Marland Kitchen.. 1 by mouth daily 7)  Fluticasone Propionate 50 Mcg/act Susp (Fluticasone propionate) .... 2 sprays each nostril once daily 8)  Benicar 20 Mg Tabs (Olmesartan medoxomil) .Marland Kitchen.. 1 once daily pa approved 9)  Triamcinolone Acetonide 0.5 % Crea (Triamcinolone acetonide) .... Apply bid to qid to affected area 10)  Magnesium Gluconate 550 Mg Tabs (Magnesium gluconate) .... Once daily 11)  Preservision/lutein Caps (Multiple vitamins-minerals) .Marland Kitchen.. 1 two times a day 12)  Levaquin 500 Mg Tabs (Levofloxacin) .Marland Kitchen.. 1 by mouth qd 13)  Tessalon Perles 100 Mg Caps (Benzonatate) .Marland Kitchen.. 1-2 by mouth two times a day as needed cogh 14)  Promethazine-codeine 6.25-10 Mg/32ml Syrp (Promethazine-codeine) .... 5-10 ml by mouth q id as needed cough  Patient Instructions: 1)  Please schedule a follow-up appointment in 2-3 weeks for skin bx. 2)  Use over-the-counter medicines for "cold": Tylenol  650mg  or Advil 400mg  every 6 hours  for fever; Delsym or Robutussin for cough. Mucinex  for congestion. Ricola or Halls for sore throat. Office visit if not better or if worse.  Prescriptions: PROMETHAZINE-CODEINE 6.25-10 MG/5ML SYRP (PROMETHAZINE-CODEINE) 5-10 ml by mouth q id as needed cough  #300 ml x 0   Entered and Authorized by:   Tresa Garter MD   Signed by:   Tresa Garter MD on 09/09/2010   Method used:   Print then Give to Patient   RxID:   [id number] TESSALON PERLES 100 MG CAPS (BENZONATATE) 1-2 by mouth two times a day as needed cogh  #120 x 1   Entered and Authorized by:   Tresa Garter MD   Signed by:   Tresa Garter MD on 09/09/2010   Method used:   Electronically to        [facility]  [address] [phone]*  (retail)       [address]       [place], [place]  [address]       Ph: [phone] or [phone]       Fax: [phone]   RxID:   [id number] LEVAQUIN 500 MG TABS (LEVOFLOXACIN) 1 by mouth qd  #10 x 0   Entered and Authorized by:   Tresa Garter MD   Signed by:   Tresa Garter MD on 09/09/2010   Method used:   [facility]        [facility]  [address] [phone]* (retail)       [address]       [place], [place]  [address]       Ph: [phone] or [phone]       Fax: [phone]  RxID:   [id number]    Orders Added: 1)  T-2 View CXR, Same Day [71020.5TC] 2)  Est. Patient Level IV [95621]

## 2010-09-17 NOTE — Progress Notes (Signed)
Summary: Call Report  Phone Note Other Incoming   Caller: [facility] Summary of Call: [facility] - Inpatient Triage Call Report Triage Record Num: [id number] Operator: Di Kindle Patient Name: Pamela Callahan Call Date & Time: 09/06/2010 8:48:42AM Patient Phone: [phone] PCP: Sonda Primes Patient Gender: Female PCP Fax : [phone] Patient DOB: 12-26-1928 Practice Name: [facility] Reason for Call: Caryn Bee, calling to report onset 09/04/10 of body aches, headache and T 101. Guideline: headache, advised per office profile, pt needs to be evaluated in 4 hrs, MOses Care UC. Protocol(s) Used: Headache Recommended Outcome per Protocol: See Provider within 4 hours Reason for Outcome: New onset of severe headache unrelieved with 4 hours of home care Care Advice:  ~ Another adult should drive.  ~ Call provider if symptoms worsen or new symptoms develop. 02/ Initial call taken by: Margaret Pyle, CMA,  September 07, 2010 8:28 AM  Follow-up for Phone Call        agree Follow-up by: Tresa Garter MD,  September 07, 2010 4:43 PM

## 2010-09-29 ENCOUNTER — Ambulatory Visit (INDEPENDENT_AMBULATORY_CARE_PROVIDER_SITE_OTHER): Payer: Medicare Other | Admitting: Internal Medicine

## 2010-09-29 ENCOUNTER — Other Ambulatory Visit: Payer: Self-pay | Admitting: Internal Medicine

## 2010-09-29 ENCOUNTER — Encounter: Payer: Self-pay | Admitting: Internal Medicine

## 2010-09-29 DIAGNOSIS — D485 Neoplasm of uncertain behavior of skin: Secondary | ICD-10-CM

## 2010-09-29 DIAGNOSIS — R05 Cough: Secondary | ICD-10-CM | POA: Insufficient documentation

## 2010-10-06 ENCOUNTER — Encounter: Payer: Self-pay | Admitting: Internal Medicine

## 2010-10-07 ENCOUNTER — Ambulatory Visit: Payer: Self-pay | Admitting: Internal Medicine

## 2010-10-08 NOTE — Miscellaneous (Signed)
Summary: Procedure Consent  Procedure Consent   Imported By: Lester Wildwood Crest 09/30/2010 10:28:15  _____________________________________________________________________  External Attachment:    Type:   Image     Comment:   External Document

## 2010-10-08 NOTE — Assessment & Plan Note (Signed)
Summary: 2-3 WK SKIN BX  STC   Vital Signs:  Patient profile:   77 year old female Height:      69 inches Weight:      179.25 pounds O2 Sat:      98 % on Room air Temp:     97.3 degrees F oral Pulse rate:   97 / minute Resp:     14 per minute BP sitting:   132 / 72  (left arm) Cuff size:   regular  Vitals Entered By: Burnard Leigh CMA(AAMA) (September 29, 2010 9:47 AM)  O2 Flow:  Room air  Procedure Note Last Tetanus: Td (03/25/2009)  Mole Biopsy/Removal: The patient complains of changing mole. Indication: changing lesion Consent signed: yes  Procedure # 1: shave biopsy    Size (in cm): 0.6 x 0.3    Region: top    Location: R ear    Comment: Risks including but not limited by incomplete procedure, bleeding, infection, recurrence were discussed with the patient. Consent form was signed. Tolerated well. Complicatons - none.     Instrument used: dermsblade    Anesthesia: 0.5 ml 1% lidocaine w/epinephrine  Cleaned and prepped with: alcohol and betadine Wound dressing: neosporin and bandaid Instructions: daily dressing changes  CC: Skin Bx Left ear.Discuss persistant cough/sls,cma Is Patient Diabetic? No Comments Pt states she needs refill: Fluticasone Propionate   CC:  Skin Bx Left ear.Discuss persistant cough/sls and cma.  History of Present Illness: F/u URI - still couging, better She is here for skin biopsy   Current Medications (verified): 1)  Evista 60 Mg  Tabs (Raloxifene Hcl) .... Once Daily 2)  Simvastatin 10 Mg Tabs (Simvastatin) .Marland Kitchen.. 1 By Mouth At Bedtime 3)  Hydrochlorothiazide 12.5 Mg  Tabs (Hydrochlorothiazide) .... Take 1 Tab  By Mouth Every Morning 4)  Tranxene-T 7.5 Mg  Tabs (Clorazepate Dipotassium) .Marland Kitchen.. 1 By Mouth Tid 5)  Aspirin 81 Mg Tabs (Aspirin) .... Once Daily 6)  Vitamin D3 1000 Unit  Tabs (Cholecalciferol) .Marland Kitchen.. 1 By Mouth Daily 7)  Fluticasone Propionate 50 Mcg/act  Susp (Fluticasone Propionate) .... 2 Sprays Each Nostril Once Daily 8)   Benicar 20 Mg Tabs (Olmesartan Medoxomil) .Marland Kitchen.. 1 Once Daily Pa Approved 9)  Triamcinolone Acetonide 0.5 % Crea (Triamcinolone Acetonide) .... Apply Bid To Qid To Affected Area 10)  Magnesium Gluconate 550 Mg Tabs (Magnesium Gluconate) .... Once Daily 11)  Preservision/lutein  Caps (Multiple Vitamins-Minerals) .Marland Kitchen.. 1 Two Times A Day 12)  Levaquin 500 Mg Tabs (Levofloxacin) .Marland Kitchen.. 1 By Mouth Qd 13)  Tessalon Perles 100 Mg Caps (Benzonatate) .Marland Kitchen.. 1-2 By Mouth Two Times A Day As Needed Cogh 14)  Promethazine-Codeine 6.25-10 Mg/14ml Syrp (Promethazine-Codeine) .... 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 Looks tired Mouth:  Less erythematous throat and intranasal mucosa  Lungs:  B CTA Heart:  Normal rate and regular rhythm. S1 and S2 normal without gallop, murmur, click, rub or other extra sounds. Msk:  No deformity or scoliosis noted of thoracic or lumbar spine.   Skin:  L ear growth 3x6 mm Psych:  Oriented X3.     Impression &  Recommendations:  Problem # 1:  NEOPLASM OF UNCERTAIN BEHAVIOR OF SKIN (ICD-238.2) Assessment New  Orders: Shave Skin Lesion 0.6-1.0cm face/ears/eyelids/nose/lips/mm (11311)  Problem # 2:  COUGH (ICD-786.2) - post post-pneumonia bronchospasm Assessment: Improved Advair two times a day sample  Complete Medication List: 1)  Evista 60 Mg Tabs (Raloxifene hcl) .... Once daily 2)  Simvastatin 10 Mg Tabs (Simvastatin) .Marland Kitchen.. 1 by mouth at bedtime 3)  Hydrochlorothiazide 12.5 Mg Tabs  (Hydrochlorothiazide) .... Take 1 tab  by mouth every morning 4)  Tranxene-t 7.5 Mg Tabs (Clorazepate dipotassium) .Marland Kitchen.. 1 by mouth tid 5)  Aspirin 81 Mg Tabs (Aspirin) .... Once daily 6)  Vitamin D3 1000 Unit Tabs (Cholecalciferol) .Marland Kitchen.. 1 by mouth daily 7)  Fluticasone Propionate 50 Mcg/act Susp (Fluticasone propionate) .... 2 sprays each nostril once daily 8)  Benicar 20 Mg Tabs (Olmesartan medoxomil) .Marland Kitchen.. 1 once daily pa approved 9)  Triamcinolone Acetonide 0.5 % Crea (Triamcinolone acetonide) .... Apply bid to qid to affected area 10)  Magnesium Gluconate 550 Mg Tabs (Magnesium gluconate) .... Once daily 11)  Preservision/lutein Caps (Multiple vitamins-minerals) .Marland Kitchen.. 1 two times a day 12)  Tessalon Perles 100 Mg Caps (Benzonatate) .Marland Kitchen.. 1-2 by mouth two times a day as needed cogh 13)  Advair Diskus 250-50 Mcg/dose Aepb (Fluticasone-salmeterol) .Marland Kitchen.. 1 inh two times a day for asthma  Patient Instructions: 1)  Keep return office visit well in Aug 2012 w/labs Prescriptions: ADVAIR DISKUS 250-50 MCG/DOSE AEPB (FLUTICASONE-SALMETEROL) 1 inh two times a day for asthma  #1 x 0   Entered and Authorized by:   Tresa Garter MD   Signed by:   Tresa Garter MD on 09/29/2010   Method used:   Print then Give to Patient   RxID:   [id number]    Orders Added: 1)  Est. Patient Level III [02409] 2)  Shave Skin Lesion 0.6-1.0cm face/ears/eyelids/nose/lips/mm [73532]

## 2010-10-13 ENCOUNTER — Telehealth: Payer: Self-pay | Admitting: Internal Medicine

## 2010-10-13 NOTE — Miscellaneous (Signed)
Summary: mammogram 2012   Clinical Lists Changes  Observations: Added new observation of MAMMOGRAM: normal (08/31/2010 15:30)      Preventive Care Screening  Mammogram:    Date:  08/31/2010    Results:  normal

## 2010-10-20 NOTE — Progress Notes (Signed)
Summary: ZPAK  Phone Note Call from Patient Call back at Home Phone [phone]   Summary of Call: Pt's husband called, pt has not gotten better. Husband is req rx for zpak for pt.  Initial call taken by: Lamar Sprinkles, CMA,  October 13, 2010 11:44 AM  Follow-up for Phone Call        ok Follow-up by: Tresa Garter MD,  October 13, 2010 7:06 PM  Additional Follow-up for Phone Call Additional follow up Details #1::        [facility] Additional Follow-up by: Verdell Face,  October 14, 2010 8:14 AM    Additional Follow-up for Phone Call Additional follow up Details #2::    left detailed mess advising pt Rx called in. Follow-up by: Lanier Prude, [facility]),  October 14, 2010 9:02 AM  New/Updated Medications: ZITHROMAX Z-PAK 250 MG TABS (AZITHROMYCIN) as directed Prescriptions: ZITHROMAX Z-PAK 250 MG TABS (AZITHROMYCIN) as directed  #1 x 0   Entered by:   Lanier Prude, CMA(AAMA)   Authorized by:   Tresa Garter MD   Signed by:   Lanier Prude, CMA(AAMA) on 10/14/2010   Method used:   [facility]        [facility]  [address] [phone][facility] (retail[facility]       [address]       [place], [place]  [address]       Ph: [phone] or [phone]       Fax: [phone]   RxID:   [id number]

## 2010-12-15 NOTE — H&P (Signed)
NAMEOZELLA, COMINS                ACCOUNT NO.:  [id number]   MEDICAL RECORD NO.:  [id number]          PATIENT TYPE:  INP   LOCATION:  NA                           FACILITY:  [facility]   PHYSICIAN:  Madlyn Frankel. Charlann Boxer, M.D.  DATE OF BIRTH:  07-Jul-1929   DATE OF ADMISSION:  01/23/2008  DATE OF DISCHARGE:                              HISTORY & PHYSICAL   PROCEDURE:  Left total hip arthroplasty.   CHIEF COMPLAINT:  Left hip pain.   HISTORY OF PRESENT ILLNESS:  A 77 year old female with a history of left  hip pain secondary to osteoarthritis.  Has been refractory to all  conservative treatment.  She has a history of right total hip  replacement in April 2008 and has done very well with no complications.  She has previously been presurgically assessed for surgery.   PRIMARY CARE PHYSICIAN:  Dr. Trinna Post Plotnikov.  She also sees Dr. Gwendalyn Ege  for her eyes with [facility].   PAST MEDICAL HISTORY:  1. Osteoarthritis.  2. Osteopenia.  3. Degenerative disk disease.  4. Macular degeneration.  5. Hypertension.  6. Dyslipidemia.   PAST SURGICAL HISTORY:  1. Back surgery 1979.  2. Cholecystectomy 2007.  3. Right total hip replacement 2008.   FAMILY HISTORY:  Pneumonia in sister with Shy-Drager syndrome.   SOCIAL HISTORY:  Married.  Primary caregiver will be her husband after  surgery.   DRUG ALLERGIES:  PENICILLIN, MORPHINE AND DERIVATIVES.   MEDICATIONS:  1. Benicar 20 mg p.o. daily.  2. HCTZ 12.5 mg p.o. daily.  3. Zocor 10 mg p.o. daily.  4. Vitamin D 1000 units p.o. daily.  5. I-Caps four daily.  6. Citracal two daily.  7. Clorazepate 7.5 mg, usually one late in the afternoon.  8. Metamucil p.o. q.a.m. and p.o. q.p.m.  9. Ecotrin 325 p.o. daily.   REVIEW OF SYSTEMS:  See HPI.   PHYSICAL EXAMINATION:  VITAL SIGNS:  Pulse 72, respirations 18, blood  pressure 124/82.  GENERAL:  Awake, alert and oriented, well-developed, well-nourished.  NECK:  Supple.  No carotid  bruits.  CHEST:  Lungs are clear to auscultation bilaterally.  BREASTS:  Deferred.  HEART:  Regular rate and rhythm.  S1-S2 distinct.  ABDOMEN:  Soft, nontender, bowel sounds present.  GENITOURINARY:  Deferred.  EXTREMITIES:  Left hip has increased pain with range of motion.  Limited  range of motion.  SKIN:  No cellulitis.  NEUROLOGIC:  Intact distal sensibilities.   LABORATORY DATA:  EKG, chest x-ray all pending presurgical testing.   IMPRESSION:  Left hip osteoarthritis.   PLAN OF ACTION:  Left total hip arthroplasty [facility] by  surgeon Dr. Durene Romans on January 23, 2008.  Risks and complications were  discussed.   Postoperative medications including Lovenox Robaxin iron, aspirin,  Colace, MiraLax provided at time of history and physical.  Pain  medicines will be provided at time of surgery.  Pain does state that in  the past a Fentanyl patch was used last year very successfully to help  with pain control postoperatively.  Will check the past medical  records  to determine what dosage amounts.     ______________________________  Yetta Glassman. [address], [place]      Madlyn Frankel. Charlann Boxer, M.D.  Electronically Signed    BLM/MEDQ  D:  01/18/2008  T:  01/18/2008  Job:  [id number]   cc:   Georgina Quint. Plotnikov, MD  [address]  [place]  [place] [address]   Dr. Gwendalyn Ege

## 2010-12-15 NOTE — Discharge Summary (Signed)
Pamela Callahan, Pamela Callahan                ACCOUNT NO.:  [id number]   MEDICAL RECORD NO.:  [id number]          PATIENT TYPE:  INP   LOCATION:  1608                         FACILITY:  [facility]   PHYSICIAN:  Madlyn Frankel. Charlann Boxer, M.D.  DATE OF BIRTH:  01-15-1929   DATE OF ADMISSION:  11/16/2006  DATE OF DISCHARGE:  11/19/2006                               DISCHARGE SUMMARY   ADMITTING DIAGNOSES:  1. Osteoarthritis.  2. Osteoporosis.  3. Degenerative disk disease.  4. Hypertension.  5. Hypercholesterolemia.  6. Diverticulitis.   DISCHARGE DIAGNOSES:  1. Osteoarthritis.  2. Osteoporosis.  3. Status post right total hip replacement.  4. Degenerative disk disease.  5. Hypertension.  6. Hypercholesteremia.  7. Diverticulitis.  8. Postop hypokalemia.  9. Postop acute blood loss anemia.  10.Postop hyponatremia.  11.Postoperative hypotension crisis.   CONSULTANTS:  None.   PROCEDURE:  Right total hip replacement with metal on metal components  used.   SURGEON:  Madlyn Frankel. Charlann Boxer, M.D.   ASSISTANT:  Dwyane Luo, PA-C.   HISTORY OF PRESENT ILLNESS:  Ms. Dinh is a 77 year old female with  history of persistent progressive right hip pain.  It was refractory to  all conservative treatments.  After extensive consultation and  presurgical clearance, she was cleared for hip surgery.   LABORATORY DATA:  Preoperative hematocrit 37.6, postop day #1 - 25.6,  postop day #2 - 23.9, postop day #3 - 24.6 stable.  Preadmission differential within normal limits.  Preadmission coagulations all within normal limits.  Preadmission chemistry within normal limits.  Postop day #1 glucose  slightly elevated to 117, postop day #2 sodium 131, potassium 3.3,  glucose 90.  Postop day #3 resolved hypokalemia and hyponatremia with  sodium 137, potassium 4, glucose 114.  GFR postop day #1 and throughout the course remained greater than 60.  Preadmission GI chemistries all within normal limits.  Preadmission urinalysis  negative.   RADIOLOGY:  Chest two-view.  Impression:  No active cardiopulmonary  disease.  Pelvis one-view.  Impression:  Right total hip arthroplasty without  immediate complicating picture.   CARDIOLOGY:  Full cardiac workup including stress echo by Dr. Peter  [place] and was at low risk for hip surgery.   HOSPITAL COURSE:  The patient underwent right total hip replacement,  tolerated procedure well and was admitted to the orthopedic floor.  Postop day #1, she was doing well.  Her hematocrit was at 25.6.  We  pulled her Hemovac.  Her dressing was clean, dry and intact.  She was  neuromuscularly and vascularly intact of her right lower extremity.  She  was weightbearing as tolerated for PT/OT.  Initial evaluation was  completed, recommended home health care PT.  Later that same day she had  hypotensive crisis.  Blood pressure medicines were held and she was  given a normal saline bolus with 500 mL with 10 mg of Lasix afterwards.  This resolved her hypotensive crisis.  Postop day #2, we saw her, she  had minimum pain, felt like she did not need anything more than Tylenol  for her pain.  She did have  hypernatremia and hypokalemia on this stay  but her dressing was changed and wound had no active drainage.  She  continued to be neuromuscularly and vascularly intact and she continued  be weightbearing as tolerated.  For postoperative hypokalemia, we gave  him some K-Dur 40 mEq p.o. b.i.d.  For her postop acute blood loss  anemia, it was stable and we would check it on postop day #3.  Her  postop hyponatremia, we just encouraged fluid intake of juice and soda.  We also held her blood pressure medicines while her systolic was less  than 120.  We planned to discharge her home with PT.  She was able to  ambulate greater than 50 feet.  Went ahead and heplocked her IV.  Postop  day #3, she was doing well in physical therapy.  Her orthostatics were  improving and she was able to complete her  physical therapy.  When we  saw her, she was out of bed in her chair on postop day #3.  There was  just a minimal amount of drainage to incision but no signs of infection.  Calves were soft.  She was neurovascularly intact of her right lower  extremity and she had walked 80 feet.  She is ready for discharge home.  All pain medicines were on the chart.  We rechecked her H&H before she  left.   DISCHARGE DISPOSITION:  Discharged home with home health care PT and  Lovenox administration.  She is stable and in improved condition.   DISCHARGE PHYSICAL THERAPY:  She is weightbearing as tolerated.   GOALS OF PHYSICAL THERAPY:  Work on gait training, proprioception,  minimize pain, maximize strength, increase range of motion, encourage  independence with activities daily living.  She is to use a rolling  walker for two weeks and progress with a single point cane thereafter.   DISCHARGE WOUND CARE:  Keep dry, change dressing daily.   DISCHARGE DIET:  No restrictions.   DISCHARGE MEDICATIONS:  1. Benicar 20 mg one p.o. q.a.m.  2. Hydrochlorothiazide 12.5 mg one p.o. q.a.m.  3. Simvastatin 20 mg half tablet nightly.  4. Citracal Plus D 500 mg one p.o. nightly.  5. Multivitamin 4 times daily.  6. Folic acid 400 mcg one p.o. daily.  7. Clorazepate 7.5 mg one p.o. three times daily as needed.  8. Magnesium 250 mg one p.o. daily p.r.n. for cramps.  9. Darvocet N 100 one to two p.o. q.4-6h. p.r.n. as needed.  10.Metamucil 1 teaspoon two times daily.  11.Lovenox 40 mg one subcu q.24h. x11 days.  12.Diazepam 5 mg one p.o. q.8h. p.r.n. pain and muscle spasm.  13.Enteric-coated aspirin 325 mg one p.o. daily after Lovenox      completed.  14.Colace 1 mg one p.o. b.i.d. constipation as needed.   DISCHARGE SPECIAL INSTRUCTIONS:  1. Follow-up with Dr. Charlann Boxer [phone] in two weeks.  2. Ice liberally.  3. TED hose on 12 hours, off 12 hours. 4. May shower if you cover with plastic and tape edges.  5.  Continue to take some potassium supplements over-the-counter x2      weeks 20 mEq daily.  6. If develop any acute shortness of breath or severe calf pain,      please call emergency services immediately.     ______________________________  Yetta Glassman [address], [place]      Madlyn Frankel. Charlann Boxer, M.D.  Electronically Signed    BLM/MEDQ  D:  12/06/2006  T:  12/06/2006  Job:  [id number] 

## 2010-12-15 NOTE — Discharge Summary (Signed)
NAMELYCIA, SACHDEVA                ACCOUNT NO.:  [id number]   MEDICAL RECORD NO.:  [id number]          PATIENT TYPE:  INP   LOCATION:  1620                         FACILITY:  [facility]   PHYSICIAN:  Madlyn Frankel. Charlann Boxer, M.D.  DATE OF BIRTH:  09-18-1928   DATE OF ADMISSION:  01/23/2008  DATE OF DISCHARGE:  01/26/2008                               DISCHARGE SUMMARY   ADMITTING DIAGNOSES:  1. Osteoarthritis.  2. Osteopenia.  3. Degenerative disc disease.  4. Macular degeneration.  5. Hypertension.  6. Dyslipidemia.   DISCHARGE DIAGNOSES:  1. Osteoarthritis.  2. Osteopenia.  3. Degenerative disc disease.  4. Macular degeneration.  5. Hypertension.  6. Dyslipidemia.   HISTORY OF PRESENT ILLNESS:  A 77 year old female with a history of left  hip pain secondary to osteoarthritis, refractory to all conservative  treatment.   CONSULTS:  None.   PROCEDURE:  Left total hip arthroplasty by surgeon Dr. Durene Romans.  Assistant:  Lenise Herald, PA-C.   LABORATORIES:  CBC on admission:  Hemoglobin 13.4, hematocrit 38.7,  platelets 193.  At the time of discharge, hemoglobin 8.6, hematocrit  24.9, platelets 130.  White cell differential all within normal limits.  Coagulation all within normal limits.  Routine chemistry upon admission:  Sodium 141, potassium 3.3, glucose 128, creatinine 0.93.  At the time of  discharge, sodium 135, potassium 3.8, glucose 94, creatinine 0.73.  Kidney function:  GFR was a little low on admission at 59, calcium 9.7.  At the time of discharge, GFR greater than 16, calcium was 8.2.  UA  showed small amount of leukocyte esterase, many bacteria.   RADIOLOGY:  Portable pelvis showed stable left hip arthroplasty, without  complicating features.  No chest x-ray found.   EKG:  Normal sinus rhythm.   HOSPITAL COURSE:  The patient underwent a left total hip placement,  tolerated the procedure well, and was admitted to the orthopedic floor.  Course of stay was  unremarkable.  She made good progress with physical  therapy.  She remained hemodynamically stable.  Dressing was changed on  a daily basis after day #1, with no significant drainage from the wound.  Hemovac was discontinued on day #1.  Lovenox DVT prophylaxis was started  on day #1 as well.  She was weightbearing as tolerated.  Made good  progress.  Seen on day #3.  Pain was well controlled, doing well with  physical therapy and progressing.  No fevers, chills, or other  complications.   DISCHARGE DISPOSITION:  Discharge home in stable, improved condition.   DISCHARGE DIET:  Regular.   DISCHARGE WOUND CARE:  Keep dry.  Change dressing on a daily basis.   DISCHARGE PHYSICAL THERAPY:  Weightbearing as tolerated with the use of  a rolling walker.   DISCHARGE MEDICATIONS:  1. Lovenox 40 mg subcutaneously q.24 h. x11 days.  2. Aspirin 325 mg p.o. daily after Lovenox completed.  3. Iron 325 mg p.o. t.i.d. x3 weeks.  4. Colace 100 mg p.o. b.i.d.  5. MiraLax 17 g p.o. daily.  6. Valium 5 mg 1 p.o. q.8 h. p.r.n. muscle  spasm, pain, or anxiety.  7. Darvocet-N 100, 1-2 p.o. q.4-6 h. p.r.n. pain.  8. Benicar 20 mg p.o. q.a.m.  9. HCTZ 12.5 mg p.o. daily.  10.Simvastatin 20 mg 1/2 tab p.o. daily.  11.Clorazepate 7.5 mg p.r.n.  12.I-caps 4 times daily.  13.Citracal with vitamin D daily.  14.Vitamin D 1000 units daily.  15.Metamucil 1 tsp 2 times daily.  16.Tylenol PM p.r.n..   DISCHARGE FOLLOWUP:  Follow up with Dr. Charlann Boxer at phone number [phone] in  2 weeks.     ______________________________  Pamela Callahan [address], [place]      Madlyn Frankel. Charlann Boxer, M.D.  Electronically Signed    BLM/MEDQ  D:  02/19/2008  T:  02/19/2008  Job:  [id number]   cc:   Georgina Quint. Plotnikov, MD  [address]  [place]  [place] [address]   Dr. Leonia Corona

## 2010-12-15 NOTE — Op Note (Signed)
Pamela Callahan, Pamela Callahan                ACCOUNT NO.:  [id number]   MEDICAL RECORD NO.:  [id number]          PATIENT TYPE:  INP   LOCATION:  0007                         FACILITY:  [facility]   PHYSICIAN:  Madlyn Frankel. Charlann Boxer, M.D.  DATE OF BIRTH:  05-17-1929   DATE OF PROCEDURE:  01/23/2008  DATE OF DISCHARGE:                               OPERATIVE REPORT   PREOPERATIVE DIAGNOSIS:  Left hip osteoarthritis.   POSTOPERATIVE DIAGNOSIS:  Left hip osteoarthritis.   PROCEDURE:  Left total hip replacement.   COMPONENTS USED:  DePuy Hip System with 54 pinnacle cup, 36 metal liner  a 6 high Trilock stem with a 36, 1.5 metal ball.   SURGEON:  Madlyn Frankel. Charlann Boxer, M.D.   ASSISTANT:  Yetta Glassman. [address], PA   ANESTHESIA:  General.   BLOOD LOSS:  450.   DRAINS:  X1.   COMPLICATIONS:  None.   INDICATIONS:  Pamela Callahan is a 77 year old female patient known to me  from previous right hip surgery.  She has done very well from her  overall procedure and subsequently developed some left hip pain.  We  reviewed the risks and benefits of the procedure and she wished to  proceed based on the results she had before.  Consent was obtained.   PROCEDURE IN DETAIL:  The patient was brought to operative theater.  Once adequate anesthesia and preoperative antibiotics, Ancef  administered, the patient was positioned in the right lateral decubitus  position with the left side up, bony prominences padded.  The left lower  extremity was prescrubbed, then prepped and draped in a sterile fashion.  A lateral incision was made and sharp dissection was carried to the  iliotibial band and gluteal fascia, incised posteriorly.  The short  external rotators were taken down and separated from the posterior  capsule.  The posterior capsule was preserved for later anatomic repair.  I also used it to protect the sciatic nerve.  The hip was dislocated.  Neck osteotomy made based off anatomic landmarks, preoperative  templating and her  contralateral hip.  Hip exposure was obtained at this  point, verifying the neck cut with the use of my trial component.  I  then used the starting drill and the box osteotome and then hand-reamed  the femur once.  I irrigated the canal to prevent fat emboli.  Setting  my anteversion at approximately 20 degrees, I then broached up from a  size 1 to a size 6 broach with good fit proximally, no torsional  movement.  At this point I packed the femur off and attended to the  acetabulum.   Acetabular exposure was obtained in routine fashion and the labrum  removed.  I began reaming with a 44 reamer, then reamed up to a 53  reamer, which is the reamer at which point I had preserved anterior and  posterior walls and columns with good subchondral bone preparation.  There was some sclerotic bone on the anterior aspect of the acetabulum  consistent with her arthritis pattern.  The 54 pinnacle cup was then  impacted.  It was impacted  at approximately 35 degrees of abduction and  20 degrees of forward flexion beneath the anterior wall, anatomically  positioned between the ischium and the anterior wall.  I placed two  screws to secure the fixation, then placed the final metal liner.   Trial reduction was now carried out with a 6 Trilock broach, high-offset  stem, 36 1.5.  The hip reduced nicely, leg lengths appeared to be  comparable to the down leg with about a millimeter or so of shuck.  Hip  range of motion was excellent without evidence of impingement either in  forward flexion and internal rotation or with external rotation and  extension.  Given this, the trial components were removed and the final  6 high-offset stem was then impacted down to the level of the neck cut.  Based on this and my trial reduction, the 36, 1.5 ball was opened and  impacted onto the dried trunion.  Hip was reduced.  Hip was irrigated  throughout the case, again at this point.  The posterior capsule was  unable to be  reapproximated at the end of the case due to some of the  debridement on the superior rim of the capsule.  Nonetheless, I placed a  medium Hemovac drain deep, reapproximated the iliotibial band and  gluteal fascia with #1 Vicryl, reapproximated subcu layer with 2-0  Vicryl and a running 4-0 Monocryl.  The hip was cleaned, dried and  dressed sterilely with Steri-Strips and sterile dressing.  She was  brought to the recovery room in stable condition, tolerating the  procedure well.      Madlyn Frankel Charlann Boxer, M.D.  Electronically Signed     MDO/MEDQ  D:  01/23/2008  T:  01/23/2008  Job:  [id number]

## 2010-12-18 NOTE — Op Note (Signed)
Pamela Callahan, Pamela Callahan                ACCOUNT NO.:  [id number]   MEDICAL RECORD NO.:  [id number]          PATIENT TYPE:  INP   LOCATION:  0006                         FACILITY:  [facility]   PHYSICIAN:  Madlyn Frankel. Charlann Boxer, M.D.  DATE OF BIRTH:  02-Nov-1928   DATE OF PROCEDURE:  11/16/2006  DATE OF DISCHARGE:                               OPERATIVE REPORT   PREOPERATIVE DIAGNOSIS:  Right hip osteoarthritis.   POSTOPERATIVE DIAGNOSIS:  Right hip osteoarthritis.   PROCEDURE:  Right total hip replacement.   COMPONENTS USED:  DePuy hip system with size 52 pinnacle cup, Trilock  13.8 lateralized offset stem, 36 metal-on-metal liner, and a 36 +5 ball.   SURGEON:  Madlyn Frankel. Charlann Boxer, M.D.   ASSISTANT:  Yetta Glassman. Mann, P.A.-C.   ANESTHESIA:  General.   BLOOD LOSS:  400 mL.   DRAINS:  One.   COMPLICATIONS:  None.   INDICATIONS FOR PROCEDURE:  [name] is a 77 year old female who  presented to the office for evaluation of right hip pain.  Radiographs  revealed advanced degenerative changes.  Conservative measures failed to  provide adequate relief.  She wished to discuss surgical intervention.  We reviewed the risks of infection, dislocation, need for revision  surgery, persistent problems with her hips postop, neurovascular injury,  need for transfusion.  Consent was obtained.   PROCEDURE IN DETAIL:  The patient was brought to the operative theater.  Adequate anesthesia and preoperative antibiotics were administered, 1  gram of vancomycin.  Please note that upon initiation of administration  of Ancef for two minutes, she began to develop a rash over her thorax.  We stopped the Ancef and gave her vancomycin 1 gram.   She was subsequently positioned in the left lateral decubitus position  with the right side up. The right lower extremity was prepped and draped  in a sterile fashion following a pre-scrub.  A lateral based incision  was made for a posterior approach to the hip.  The  iliotibial band and  gluteus fascia were incised in line with the incision.  The short  external rotators were taken down to separate the posterior capsule.  An  L-capsulotomy was made to save the posterior leaflet for anatomic repair  as well as protection against the sciatic nerve.   The hip was dislocated and the neck osteotomy was made based off  anatomic landmarks and preoperative templating based on relationship to  the trochanter.  Following the neck osteotomy and identification of  advanced degenerative changes on both femoral acetabular sides,  attention was first directed to the femur.  Femoral preparation was  carried out per protocol for the Trilock stem including placement of  anteversion at 20 degrees with a box osteotome, hand reaming, and then  irrigating to prevent fat emboli.  I then broached all the way up to a  12.5 broach which sat at the level of my neck cut.  Once I had done  this, I packed the femur with a sponge and attended to the acetabulum.   Acetabular exposure was obtained routinely without difficulty and  labrectomy  carried out.  I began reaming with a 45 reamer and carried up  to a 51 reamer with excellent bony bed preparation.  The final 52 cup  was impacted without difficulty at 35-40 degrees of abduction and 20  degrees of forward flexion.  There was some osteophytes noted anteriorly  and I debrided this off and removed it without complication.  A single  cancellus screw was placed due to the fact that I had excellent scratch  fit and the screw purchase of the one screw was excellent.  I then  placed a 36 neutral trial liner and attended to a trial reduction.   Trial reduction was carried out with a 12.5 stem and 36 +1.5 ball.  When  I trialed it, the combined anteversion was 45-50 degrees and the hip  felt stable.  However, with the knee in extension, there was a  significant amount of shuck present.  Given this, I went ahead and took  out the trial  12.5 and broached up to a 13.8 which sat a little bit  proud on the neck which was what I was hoping for from the neck cut.  I  then trialed with a +1.5 ball.  At this point, there was only 1-2 mm of  shuck, the leg lengths remained normal compared to the down leg as  compared to preoperative position, and the hip was very stable  throughout the range of motion without any evidence of impingement  anteriorly with forward flexion internal rotation to about 80 degrees in  the sleep position as well as with extension and external rotation.  Given all these parameters, the trial components were removed including  the liner.  A central hole eliminator was placed and the 36 metal liner  was impacted into a clean and dried cup.   I then used a 13.8 lateralized offset stem and impacted it to the level  where the neck cut, I impacted it close to where the broach was, if not  a little bit further down.  When this was carried out, I retrialed the  hip stem and noted there was a +1.5 ball. With this 1.5 ball, I felt  there was a little bit more shuck than what I had noted during the trial  which indicates that the stem was impacted 1-2 mm more.  I then chose  the +5 ball.  The +5 ball was then impacted on the clean and dry  trunnion.  I was very pleased with the range of motion and stability and  noted that there was about 1-2 mm of shuck and I was pleased with this.  Throughout the case, I irrigated the hip.  I reapproximated the  posterior capsular leaflet to the superior leaflet using #1 Ethilon.  I  then injected 10 mL of FloSeal due to some oozing from her synovial  capsular junction region.  A medium Hemovac drain was used, as well, to  help decompress the joint surface.   The iliotibial band was reapproximated using #1 Ethilon, the gluteus  fascia with #1 Vicryl, the remainder of the wound was closed in layers  with a running 4-0 Monocryl.  The hip was cleaned, dried, and dressed sterilely  with Steri-Strips and dressing.  She was brought to the  recovery room extubated in stable condition.      Madlyn Frankel Charlann Boxer, M.D.  Electronically Signed     MDO/MEDQ  D:  11/16/2006  T:  11/16/2006  Job:  [id number]

## 2010-12-18 NOTE — Assessment & Plan Note (Signed)
[facility]                           PRIMARY CARE OFFICE NOTE   Tory, Mckissack Sherrye Payor                       MRN:          [id number]  DATE:10/14/2006                            DOB:          [birth year]    The consultation is requested by Dr. Durene Romans, orthopedic surgery.   REASON FOR CONSULTATION:  Medical clearance for right total hip  replacement scheduled in about a month.   HISTORY:  The patient is a 77 year old female who presents with  complaints of right hip pain. She is ready for surgery. She is also  concerned about her macular degeneration that she has to address quite  soon as well. She has no other excess complaints.   PAST MEDICAL HISTORY:  Hypertension; history of depression;  hyperlipidemia; osteoporosis; chronic low back pain; history of  gallstones in 2006.   ALLERGIES:  PENICILLIN  FOSAMAX  ACE gave cough.  EFFEXOR XR  K-DUR gave diarrhea.  NORVASC  YELLOW DYE   SOCIAL HISTORY:  She is married with grown children. She has a PhD. 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   </CURRENT MEDICATIONS  1. Benicar 20 mg daily.  2. Zocor 10 mg daily.  3. Tranxene7.5 mg 3 times daily.  4. Aspirin 81 mg daily.  5. Feldene 20 mg 2-3 per day.  6. Protonix 40 mg daily.  7. I-caps   REVIEW OF SYSTEMS:  There is no chest pain, shortness of breath, or  syncope. Chronic problems with insomnia. Worsening problems with the  right hip. The rest of the 18 point review of systems is as above or  negative.   PHYSICAL EXAMINATION:  GENERAL:  She is in no acute distress.  HEENT:  Moist mucosa.  VITAL SIGNS:  Blood pressure 151/92, pulse 84, temperature 97.8, weight  187 pounds.  NECK:  Supple; no thyromegaly or bruits.  LUNGS:  Clear to auscultation and percussion with no rales.  HEART:  S1, S2, no murmur or gallop.  ABDOMEN:  Soft and nontender; no organomegaly or masses.  EXTREMITIES:  Without edema. She ambulates with a cane and limping due  to pain in the right hip.  NEUROLOGIC:  She is alert, oriented, and incorporative. She denies being  depressed.  SKIN:  Clear with aging changes.   IMPRESSION/RECOMMENDATIONS:  1. Surgical clearance. In the view of her hypertension and      dyslipidemia, we would like to obtain a Cardiolite Persantine      stress test. The results will be submitted to Dr. Charlann Boxer. Assuming      that the Cardiolite, preoperative chest x-ray and labs (would like      to check A1C, TSH, UA, vitamin D level, metabolic panel, hepatic      panel, CBC) If they are normal, she should be clear for surgery. I      would discontinue Evista perioperatively. I would continue with      benzodiazepines to prevent  withdrawal. I will try to schedule a      Persantine  Cardiolite test with Dr. Elvis Coil office.  2. Hypertension, suboptimal control. She states her blood pressure is      normal at home. I asked her to increase Benicar to 40 mg a day now      to avoid perioperative problems with high blood pressure.  3. Chronic anxiety, as above. I will have to cover the patient with      Tranxene or another benzodiazepine perioperatively.  4. History of osteoporosis. I am getting her vitamin D level.   Please call Dr. Rene Paci, our hospitalist,  or Dr. Swaziland if you  would need inpatient help. Thank you for this consultation.     Georgina Quint. Plotnikov, MD  Electronically Signed    AVP/MedQ  DD: 10/15/2006  DT: 10/17/2006  Job #: [id number]   cc:   Madlyn Frankel Charlann Boxer, M.D.  Peter M. Swaziland, M.D.

## 2010-12-18 NOTE — Op Note (Signed)
Pamela Callahan, Pamela Callahan                ACCOUNT NO.:  [id number]   MEDICAL RECORD NO.:  [id number]          PATIENT TYPE:  AMB   LOCATION:  DAY                          FACILITY:  [facility]   PHYSICIAN:  Currie Paris, M.D.DATE OF BIRTH:  [birth year]   DATE OF PROCEDURE:  08/11/2005  DATE OF DISCHARGE:                                 OPERATIVE REPORT   OFFICE MEDICAL RECORD NUMBER:  [facility]S [phone].   PREOPERATIVE DIAGNOSIS:  Gallstones with chronic calculus cholecystitis.   POSTOPERATIVE DIAGNOSIS:  Gallstones with chronic calculus cholecystitis.   OPERATION:  Laparoscopic cholecystectomy with operative cholangiogram.   SURGEON:  Dr. Jamey Ripa   ASSISTANT:  Dr. Zachery Dakins   ANESTHESIA:  General endotracheal.   CLINICAL HISTORY:  This is a 77 year old with biliary-type symptoms and the  finding of gallstones.  She elected to proceed to cholecystectomy.   DESCRIPTION OF PROCEDURE:  The patient was seen in the holding area, and she  had no further questions.  She understood the plans were for removal of her  gallbladder and x-rays of her bile duct.   The patient was taken to the operating room and after satisfactory general  endotracheal anesthesia had been obtained, the abdomen was prepped and  draped.  The time-out occurred.   Marcaine 0.25% plain was used for each incision.  She had an umbilical  incision made first with the fascia opened and the peritoneal cavity entered  under direct vision.  The pursestring was placed, the Hasson introduced, and  the abdomen insufflated under direct vision.  The patient was placed in  reverse Trendelenburg and tilted to the left.  A 10-11 trocar was placed in  the epigastrium and two 5 mm laterally.   There are omental adhesions to the entire length of the gallbladder, and  these were gently taken down.  The peritoneum over the area of the cystic  duct was opened and a very long cystic duct identified and dissected out  nicely.  I could see what  appeared to be the cystic artery behind it, but I  could not really get good access after we had opened the peritoneum on all  sides because of some fatty tissue, and these were directly behind the  cystic duct.   At this point, I clipped the cystic duct and opened it.  A Cook catheter was  introduced and operative angiography done which appeared to be normal with a  fairly long cystic duct, filling of the common duct and hepatic radicals and  good filling into the duodenum.   The cystic duct catheter was removed and three clips placed on the stay  side, and it was divided.  Further dissection showed branches of the cystic  artery, and these were again clipped leaving three clips on each on the stay  side prior to dividing them.  As the gallbladder was removed from below to  above, I saw a little tiny what I thought might be a little bile droplet,  and I was concerned there could be a small duct of Luschka.  We irrigated it  and looked, and  I could never see any further evidence of any bile staining  whatsoever.  The gallbladder was removed from below to above.  We went back,  cauterized the bed of the gallbladder, irrigated it, and again looked to  make sure that there was no evidence of bile leak, and there was none.  Nevertheless, I elected to place a 19 Blake drain in.   At this point, the gallbladder was placed in a bag and brought out the  umbilical port.  The abdomen was reinsufflated.  We suctioned out the  irrigant and then placed a 19 Blake drain in through the epigastric port,  brought out the lateral port.  I placed it along the bed of the gallbladder  and well behind it so that any bile leak that might develop afterwards would  be hopefully caught by the drain.  Again, there did not did not appear to be  any further bile staining at this time.   Everything appeared to be dry, but the surface of the liver bed had been a  little bit raw, so I left some Surgicel on that.  We  then removed the  remaining lateral port under direct vision, and there was no bleeding.  The  umbilical port was removed and the site closed with the previously placed  pursestring.  The abdomen was deflated through the epigastric port which was  then removed.  Skin was closed with 4-0 Monocryl subcuticular plus  Dermabond.   The patient tolerated the procedure well.  There no operative complications.  All counts were correct.      Currie Paris, M.D.  Electronically Signed     CJS/MEDQ  D:  08/11/2005  T:  08/12/2005  Job:  [id number]   cc:   Georgina Quint. Plotnikov, M.D. [facility]  [address]  [place]  [place] [address]   Wilhemina Bonito. Marina Goodell, M.D. [facility]  [address]  [place]  [place] [address]

## 2010-12-18 NOTE — H&P (Signed)
Pamela Callahan, ODONNELL                ACCOUNT NO.:  [id number]   MEDICAL RECORD NO.:  [id number]         PATIENT TYPE:  LINP   LOCATION:                               FACILITY:  [facility]   PHYSICIAN:  Madlyn Frankel. Charlann Boxer, M.D.  DATE OF BIRTH:  01/18/1929   DATE OF ADMISSION:  11/16/2006  DATE OF DISCHARGE:                              HISTORY & PHYSICAL   PROCEDURE TO BE PERFORMED:  Right total hip arthroplasty.   CHIEF COMPLAINTS:  Right hip pain.   HISTORY OF PRESENT ILLNESS:  This 77 year old female with a history  persistent progressive right hip pain.  It has been refractory to all  conservative treatments.  She had been cleared presurgically by her  primary care physician, Dr. Posey Rea, and has had cardiology clearance  by Dr. Peter Swaziland who has indicated that she is a low cardiac risk for  planned hip surgery.   PAST MEDICAL HISTORY:  1. Hypertension.  2. Hypercholesteremia.  3. Diverticulitis.  4. Osteoporosis.  5. Degenerative disk disease.  6. Osteoarthritis.   PAST SURGICAL HISTORY:  1. Laminectomy in 1979.  2. Cholecystectomy in January 2007.   FAMILY HISTORY:  Heart disease and hypertension.   SOCIAL HISTORY:  She is married x56 years.  Her husband and an aide will  be her primary caregivers after the surgery.   ALLERGIES:  PENICILLIN AND MORPHINE DERIVATIVES.   MEDICATIONS:  1. Benicar 20/12.5 one p.o. daily.  2. Zocor 10 mg one p.o. daily.  3. Tranxene 7.5 mg one p.o. p.r.n.  4. She takes eye drops four times daily.  5. Calcium supplement 1200 mg.  6. Folic acid.  7. Aspirin 81 mg p.o. daily.   REVIEW OF SYSTEMS:  Negative other than as in HPI.   PHYSICAL EXAMINATION:  VITAL SIGNS:  Pulse 84, respirations 18, blood  pressure 148/98.  GENERAL:  She is awake, alert and oriented, well-developed, well-  nourished, no acute distress.  NECK:  Supple.  No carotid bruits.  CHEST/LUNGS:  Clear to auscultation bilaterally.  BREASTS:  Deferred.  HEART:  Regular  rate and rhythm without gallops, clicks, rubs or  murmurs.  ABDOMEN:  Soft, nontender, nondistended.  Bowel sounds present.  GENITOURINARY:  Deferred.  EXTREMITIES: Right hip increased groin pain with internal external  rotation.  SKIN:  Dorsalis pedis pulse positive right distal lower extremity.  NEUROLOGIC:  Intact distal sensibilities.   LABORATORY DATA AND X-RAY FINDINGS:  Labs, EKG and x-rays are pending  [place] presurgical clearance.   Cardiology cleared preoperatively by Dr. Peter Swaziland who perform a  stress test as well as an EKG.   IMPRESSION:  Right hip osteoarthritis.   PLAN:  Plan of action is right total hip arthroplasty on November 16, 2006,  at [facility] surgeon Dr. Durene Romans.  Risks and  complications were discussed.  Questions were encouraged, answered and  reviewed.     ______________________________  Yetta Glassman [address], [place]      Madlyn Frankel. Charlann Boxer, M.D.  Electronically Signed    BLM/MEDQ  D:  11/02/2006  T:  11/03/2006  Job:  [id number]   cc:   Georgina Quint. Plotnikov, MD  [address]  [place]  [place] [address]   Peter M. Swaziland, M.D.  Fax: [phone]

## 2011-03-12 ENCOUNTER — Other Ambulatory Visit: Payer: Self-pay | Admitting: Internal Medicine

## 2011-03-16 ENCOUNTER — Other Ambulatory Visit: Payer: Self-pay | Admitting: *Deleted

## 2011-03-16 ENCOUNTER — Encounter: Payer: Self-pay | Admitting: Internal Medicine

## 2011-03-16 ENCOUNTER — Ambulatory Visit (INDEPENDENT_AMBULATORY_CARE_PROVIDER_SITE_OTHER): Payer: Medicare Other | Admitting: Internal Medicine

## 2011-03-16 ENCOUNTER — Other Ambulatory Visit (INDEPENDENT_AMBULATORY_CARE_PROVIDER_SITE_OTHER): Payer: Medicare Other

## 2011-03-16 VITALS — BP 140/78 | HR 76 | Temp 97.4°F | Resp 16 | Ht 70.0 in | Wt 173.0 lb

## 2011-03-16 DIAGNOSIS — Z0000 Encounter for general adult medical examination without abnormal findings: Secondary | ICD-10-CM

## 2011-03-16 DIAGNOSIS — I10 Essential (primary) hypertension: Secondary | ICD-10-CM

## 2011-03-16 DIAGNOSIS — M545 Low back pain: Secondary | ICD-10-CM

## 2011-03-16 LAB — CBC WITH DIFFERENTIAL/PLATELET
Basophils Absolute: 0 10*3/uL (ref 0.0–0.1)
Eosinophils Absolute: 0.2 10*3/uL (ref 0.0–0.7)
HCT: 36.7 % (ref 36.0–46.0)
Lymphs Abs: 1.8 10*3/uL (ref 0.7–4.0)
MCHC: 34.7 g/dL (ref 30.0–36.0)
Monocytes Absolute: 0.5 10*3/uL (ref 0.1–1.0)
Monocytes Relative: 9.3 % (ref 3.0–12.0)
Platelets: 184 10*3/uL (ref 150.0–400.0)
RDW: 13.2 % (ref 11.5–14.6)

## 2011-03-16 LAB — LIPID PANEL
Cholesterol: 154 mg/dL (ref 0–200)
VLDL: 21 mg/dL (ref 0.0–40.0)

## 2011-03-16 LAB — URINALYSIS
Bilirubin Urine: NEGATIVE
Hgb urine dipstick: NEGATIVE
Nitrite: NEGATIVE
Total Protein, Urine: NEGATIVE

## 2011-03-16 LAB — COMPREHENSIVE METABOLIC PANEL
Albumin: 4.2 g/dL (ref 3.5–5.2)
BUN: 14 mg/dL (ref 6–23)
Calcium: 9.1 mg/dL (ref 8.4–10.5)
Chloride: 104 mEq/L (ref 96–112)
Glucose, Bld: 88 mg/dL (ref 70–99)
Potassium: 4 mEq/L (ref 3.5–5.1)

## 2011-03-16 LAB — TSH: TSH: 2.49 u[IU]/mL (ref 0.35–5.50)

## 2011-03-16 MED ORDER — TRIAMCINOLONE ACETONIDE 0.5 % EX CREA: TOPICAL_CREAM | Freq: Two times a day (BID) | CUTANEOUS | Status: DC

## 2011-03-16 MED ORDER — FLUTICASONE PROPIONATE 50 MCG/ACT NA SUSP: 1.0000 | Freq: Every day | NASAL | Status: DC

## 2011-03-16 MED ORDER — FEXOFENADINE HCL 180 MG PO TABS: 180.0000 mg | ORAL_TABLET | Freq: Every day | ORAL | Status: DC

## 2011-03-16 MED ORDER — RALOXIFENE HCL 60 MG PO TABS: 60.0000 mg | ORAL_TABLET | Freq: Every day | ORAL | Status: DC

## 2011-03-16 MED ORDER — SIMVASTATIN 10 MG PO TABS: 10.0000 mg | ORAL_TABLET | Freq: Every day | ORAL | Status: DC

## 2011-03-16 MED ORDER — CLORAZEPATE DIPOTASSIUM 7.5 MG PO TABS: 7.5000 mg | ORAL_TABLET | Freq: Three times a day (TID) | ORAL | Status: DC

## 2011-03-16 MED ORDER — HYDROCHLOROTHIAZIDE 12.5 MG PO CAPS: 12.5000 mg | ORAL_CAPSULE | ORAL | Status: DC

## 2011-03-16 MED ORDER — LOSARTAN POTASSIUM 100 MG PO TABS: 100.0000 mg | ORAL_TABLET | Freq: Every day | ORAL | Status: DC

## 2011-03-16 NOTE — Progress Notes (Signed)
  Subjective:    Patient ID: Pamela Callahan, female    DOB: Aug 12, 1928, 77 y.o.   MRN: [id number]  HPI  The patient is here for a wellness exam. The patient has been doing well overall without major physical or psychological issues going on lately. The patient needs to address  chronic hypertension that has been well controlled with medicines; to address chronic  hyperlipidemia controlled with medicines as well; asthma, controlled with medical treatment and diet.   Review of Systems  Constitutional: Positive for fatigue. Negative for fever, chills, diaphoresis, activity change, appetite change and unexpected weight change.  HENT: Negative for hearing loss, ear pain, congestion, sore throat, sneezing, mouth sores, neck pain, dental problem, voice change, postnasal drip and sinus pressure.   Eyes: Negative for pain and visual disturbance.  Respiratory: Negative for cough, chest tightness, wheezing and stridor.   Cardiovascular: Negative for chest pain, palpitations and leg swelling.  Gastrointestinal: Negative for nausea, vomiting, abdominal pain, blood in stool, abdominal distention and rectal pain.  Genitourinary: Negative for dysuria, hematuria, decreased urine volume, vaginal bleeding, vaginal discharge, difficulty urinating, vaginal pain and menstrual problem.  Musculoskeletal: Positive for back pain. Negative for joint swelling and gait problem.  Skin: Negative for color change, rash and wound.  Neurological: Negative for dizziness, tremors, syncope, speech difficulty and light-headedness.  Hematological: Negative for adenopathy.  Psychiatric/Behavioral: Negative for suicidal ideas, hallucinations, behavioral problems, confusion, sleep disturbance, dysphoric mood and decreased concentration. The patient is not hyperactive.        Objective:   Physical Exam  Constitutional: She appears well-developed and well-nourished. No distress.  HENT:  Head: Normocephalic.  Right Ear: External  ear normal.  Left Ear: External ear normal.  Nose: Nose normal.  Mouth/Throat: Oropharynx is clear and moist.  Eyes: Conjunctivae are normal. Pupils are equal, round, and reactive to light. Right eye exhibits no discharge. Left eye exhibits no discharge.  Neck: Normal range of motion. Neck supple. No JVD present. No tracheal deviation present. No thyromegaly present.  Cardiovascular: Normal rate, regular rhythm and normal heart sounds.   Pulmonary/Chest: No stridor. No respiratory distress. She has no wheezes.  Abdominal: Soft. Bowel sounds are normal. She exhibits no distension and no mass. There is no tenderness. There is no rebound and no guarding.  Musculoskeletal: She exhibits no edema and no tenderness.  Lymphadenopathy:    She has no cervical adenopathy.  Neurological: She displays normal reflexes. No cranial nerve deficit. She exhibits normal muscle tone. Coordination normal.  Skin: No rash noted. No erythema.  Psychiatric: She has a normal mood and affect. Her behavior is normal. Judgment and thought content normal.          Assessment & Plan:

## 2011-03-16 NOTE — Assessment & Plan Note (Signed)
On Rx prn 

## 2011-03-16 NOTE — Assessment & Plan Note (Signed)
Will try to switch to Cozaar if tolerated

## 2011-03-17 ENCOUNTER — Telehealth: Payer: Self-pay | Admitting: *Deleted

## 2011-03-17 MED ORDER — OLMESARTAN MEDOXOMIL 20 MG PO TABS: 20.0000 mg | ORAL_TABLET | Freq: Every day | ORAL | Status: DC

## 2011-03-17 NOTE — Telephone Encounter (Signed)
PA for Benicar approved from 7.24.12 until 8.14.2014 per [employer]. Pt informed/pharmacy informed.

## 2011-03-18 ENCOUNTER — Telehealth: Payer: Self-pay | Admitting: *Deleted

## 2011-03-18 NOTE — Telephone Encounter (Signed)
Copies of 03-16-11 labs mailed to pt.

## 2011-03-29 ENCOUNTER — Encounter: Payer: Self-pay | Admitting: Internal Medicine

## 2011-04-22 ENCOUNTER — Ambulatory Visit (INDEPENDENT_AMBULATORY_CARE_PROVIDER_SITE_OTHER): Payer: Medicare Other | Admitting: *Deleted

## 2011-04-22 DIAGNOSIS — Z23 Encounter for immunization: Secondary | ICD-10-CM

## 2011-04-29 LAB — CBC
HCT: 25.1 — ABNORMAL LOW
HCT: 38.7
Hemoglobin: 13.4
MCV: 90.2
MCV: 91.2
Platelets: 130 — ABNORMAL LOW
Platelets: 136 — ABNORMAL LOW
RBC: 4.27
RDW: 13
RDW: 13.1
WBC: 6.4

## 2011-04-29 LAB — URINALYSIS, ROUTINE W REFLEX MICROSCOPIC
Glucose, UA: NEGATIVE
Hgb urine dipstick: NEGATIVE
Ketones, ur: NEGATIVE
Protein, ur: NEGATIVE

## 2011-04-29 LAB — BASIC METABOLIC PANEL
BUN: 7
BUN: 9
CO2: 24
Chloride: 105
Creatinine, Ser: 0.73
GFR calc non Af Amer: 59 — ABNORMAL LOW
GFR calc non Af Amer: >60
Glucose, Bld: 100 — ABNORMAL HIGH
Glucose, Bld: 128 — ABNORMAL HIGH
Potassium: 3.3 — ABNORMAL LOW
Potassium: 3.9
Sodium: 141

## 2011-04-29 LAB — PROTIME-INR
INR: 1
Prothrombin Time: 13.5

## 2011-04-29 LAB — DIFFERENTIAL
Basophils Relative: 0
Eosinophils Absolute: 0.1
Lymphs Abs: 1.6
Neutrophils Relative %: 66

## 2011-04-29 LAB — URINE MICROSCOPIC-ADD ON

## 2011-06-22 ENCOUNTER — Ambulatory Visit (INDEPENDENT_AMBULATORY_CARE_PROVIDER_SITE_OTHER): Payer: Medicare Other | Admitting: Internal Medicine

## 2011-06-22 ENCOUNTER — Encounter: Payer: Self-pay | Admitting: Internal Medicine

## 2011-06-22 DIAGNOSIS — M1990 Unspecified osteoarthritis, unspecified site: Secondary | ICD-10-CM

## 2011-06-22 DIAGNOSIS — I10 Essential (primary) hypertension: Secondary | ICD-10-CM

## 2011-06-22 DIAGNOSIS — M545 Low back pain: Secondary | ICD-10-CM

## 2011-06-22 DIAGNOSIS — F411 Generalized anxiety disorder: Secondary | ICD-10-CM

## 2011-06-22 NOTE — Assessment & Plan Note (Signed)
Continue with current prescription therapy as reflected on the Med list.  

## 2011-06-22 NOTE — Progress Notes (Signed)
  Subjective:    Patient ID: Pamela Callahan, female    DOB: 08-21-1928, 77 y.o.   MRN: [id number]  HPI   The patient is here to follow up on chronic LBP,  depression, anxiety, headaches and chronic moderate fibromyalgia symptoms controlled with medicines, diet and exercise.   Review of Systems  Constitutional: Negative for chills, activity change, appetite change, fatigue and unexpected weight change.  HENT: Negative for congestion, mouth sores and sinus pressure.   Eyes: Negative for visual disturbance.  Respiratory: Negative for cough and chest tightness.   Gastrointestinal: Negative for nausea and abdominal pain.  Genitourinary: Negative for frequency, difficulty urinating and vaginal pain.  Musculoskeletal: Positive for back pain. Negative for gait problem.  Skin: Negative for pallor and rash.  Neurological: Negative for dizziness, tremors, weakness, numbness and headaches.  Psychiatric/Behavioral: Negative for confusion and sleep disturbance.       Objective:   Physical Exam  Constitutional: She appears well-developed and well-nourished. No distress.  HENT:  Head: Normocephalic.  Right Ear: External ear normal.  Left Ear: External ear normal.  Nose: Nose normal.  Mouth/Throat: Oropharynx is clear and moist.  Eyes: Conjunctivae are normal. Pupils are equal, round, and reactive to light. Right eye exhibits no discharge. Left eye exhibits no discharge.  Neck: Normal range of motion. Neck supple. No JVD present. No tracheal deviation present. No thyromegaly present.  Cardiovascular: Normal rate, regular rhythm and normal heart sounds.   Pulmonary/Chest: No stridor. No respiratory distress. She has no wheezes.  Abdominal: Soft. Bowel sounds are normal. She exhibits no distension and no mass. There is no tenderness. There is no rebound and no guarding.  Musculoskeletal: She exhibits tenderness (LS is tender w/ROM). She exhibits no edema.  Lymphadenopathy:    She has no cervical  adenopathy.  Neurological: She displays normal reflexes. No cranial nerve deficit. She exhibits normal muscle tone. Coordination normal.  Skin: No rash noted. No erythema.  Psychiatric: She has a normal mood and affect. Her behavior is normal. Judgment and thought content normal.          Assessment & Plan:

## 2011-07-05 ENCOUNTER — Other Ambulatory Visit: Payer: Self-pay | Admitting: Internal Medicine

## 2011-08-04 ENCOUNTER — Telehealth: Payer: Self-pay | Admitting: Internal Medicine

## 2011-08-04 NOTE — Telephone Encounter (Signed)
Pt states that she has been having some problems with diarrhea, states she and Dr. Marina Goodell have discussed this in the past. She also states she found a bump on the side of her rectum that she has been placing antibiotic ointment on. Pt would like to be seen and discuss these with Dr. Marina Goodell. Pt scheduled to see Dr. Marina Goodell 08/09/11@8 :45am. Pt aware of appt date and time.

## 2011-08-09 ENCOUNTER — Ambulatory Visit (INDEPENDENT_AMBULATORY_CARE_PROVIDER_SITE_OTHER): Payer: Medicare Other | Admitting: Internal Medicine

## 2011-08-09 ENCOUNTER — Encounter: Payer: Self-pay | Admitting: Internal Medicine

## 2011-08-09 VITALS — BP 110/62 | HR 90 | Ht 70.0 in | Wt 176.6 lb

## 2011-08-09 DIAGNOSIS — R933 Abnormal findings on diagnostic imaging of other parts of digestive tract: Secondary | ICD-10-CM

## 2011-08-09 DIAGNOSIS — R197 Diarrhea, unspecified: Secondary | ICD-10-CM | POA: Diagnosis not present

## 2011-08-09 DIAGNOSIS — K219 Gastro-esophageal reflux disease without esophagitis: Secondary | ICD-10-CM

## 2011-08-09 DIAGNOSIS — R159 Full incontinence of feces: Secondary | ICD-10-CM | POA: Diagnosis not present

## 2011-08-09 NOTE — Progress Notes (Signed)
HISTORY OF PRESENT ILLNESS:  Pamela Callahan is a 78 y.o. female with multiple medical problems as listed below. She presents today regarding a transient nodule on the left buttock and minor fecal incontinence. She was last seen in 2006 regarding abdominal pain. Complete colonoscopy in May 2005 revealed pandiverticulosis. Upper endoscopy in August of 2006 revealed no abnormalities. She does have GERD. Ultrasound revealed gallstones. She underwent cholecystectomy in January 2007 without recurrence of pain. She presents now, at the insistence of her husband, regarding a "bump" in the rectum. She notices about one week ago. He was nontender. She treated this with a combination of nystatin and steroid cream. The problem resolved. She also mentions to me chronic urgency with somewhat loose stools in the morning. This occurs principally, after breakfast. The past year, she has noticed occasionally the inability to discriminate between gas and feces. Approximately once per month she'll have minor staining on the undergarments. No other issues. No bowel pain or bleeding. She's quite involved with the care of her husband, who has failing health.  REVIEW OF SYSTEMS:  All non-GI ROS negative except for back pain  Past Medical History  Diagnosis Date  . Depression   . Hypertension   . Anxiety   . GERD (gastroesophageal reflux disease)   . Allergy   . Hyperlipidemia   . Osteopenia   . Low back pain     Past Surgical History  Procedure Date  . Cholecystectomy 2006  . Total hip arthroplasty 2008    Right    Social History Pamela Callahan  reports that she has never smoked. She has never used smokeless tobacco. She reports that she does not drink alcohol or use illicit drugs.  family history includes Hypertension in her mother and other.  Allergies  Allergen Reactions  . Ace Inhibitors     REACTION: cough  . Penicillins        PHYSICAL EXAMINATION: Vital signs: BP 110/62  Pulse 90  Ht 5'  10" (1.778 m)  Wt 176 lb 9.6 oz (80.105 kg)  BMI 25.34 kg/m2  Constitutional: generally well-appearing, no acute distress Psychiatric: alert and oriented x3, cooperative Eyes: extraocular movements intact, anicteric, conjunctiva pink Mouth: oral pharynx moist, no lesions Neck: supple no lymphadenopathy Cardiovascular: heart regular rate and rhythm, no murmur Lungs: clear to auscultation bilaterally Abdomen: soft, nontender, nondistended, no obvious ascites, no peritoneal signs, normal bowel sounds, no organomegaly Rectal:no significant internal or external abnormality Extremities: no lower extremity edema bilaterally Skin: no lesions on visible extremities Neuro: No focal deficits.   ASSESSMENT:  #1. Transient nontender small "bump" on the buttock. Resolved. May have been folliculitis. No appreciable abnormalities at this time #2. Minor incontinence due to the inability to discriminate between gas and feces #3. Pandiverticulosis #4. GERD #5. Status post cholecystectomy   PLAN:  #1. Reassurance. Contact the office or her primary provider if the "bump" returns #2. Reflux precautions #3. Consider low-dose Imodium if problem with bowel habits worsens  #4. Protective undergarments as needed #5. GI followup when necessary

## 2011-08-09 NOTE — Patient Instructions (Signed)
Please follow up as needed 

## 2011-09-17 ENCOUNTER — Telehealth: Payer: Self-pay | Admitting: *Deleted

## 2011-09-17 NOTE — Telephone Encounter (Signed)
Rf req for Clorazepate 7.5 mg 1 po tid. Ok to Rf? 

## 2011-09-18 NOTE — Telephone Encounter (Signed)
OK to fill this prescription with additional refills x5 Thank you!  

## 2011-09-22 MED ORDER — CLORAZEPATE DIPOTASSIUM 7.5 MG PO TABS: 7.5000 mg | ORAL_TABLET | Freq: Three times a day (TID) | ORAL | Status: DC

## 2011-09-22 NOTE — Telephone Encounter (Signed)
rx printed/signed/faxed 

## 2011-09-30 DIAGNOSIS — H353190 Nonexudative age-related macular degeneration, unspecified eye, stage unspecified: Secondary | ICD-10-CM | POA: Diagnosis not present

## 2011-09-30 DIAGNOSIS — Z961 Presence of intraocular lens: Secondary | ICD-10-CM | POA: Diagnosis not present

## 2011-09-30 DIAGNOSIS — H353290 Exudative age-related macular degeneration, unspecified eye, stage unspecified: Secondary | ICD-10-CM | POA: Diagnosis not present

## 2011-09-30 DIAGNOSIS — H2510 Age-related nuclear cataract, unspecified eye: Secondary | ICD-10-CM | POA: Diagnosis not present

## 2011-11-30 ENCOUNTER — Encounter: Payer: Self-pay | Admitting: Endocrinology

## 2011-11-30 ENCOUNTER — Ambulatory Visit (INDEPENDENT_AMBULATORY_CARE_PROVIDER_SITE_OTHER): Payer: Medicare Other | Admitting: Endocrinology

## 2011-11-30 VITALS — BP 134/88 | HR 70 | Temp 97.6°F | Ht 70.0 in | Wt 170.0 lb

## 2011-11-30 DIAGNOSIS — R42 Dizziness and giddiness: Secondary | ICD-10-CM | POA: Diagnosis not present

## 2011-11-30 MED ORDER — MECLIZINE HCL 12.5 MG PO TABS: 12.5000 mg | ORAL_TABLET | Freq: Three times a day (TID) | ORAL | Status: AC | PRN

## 2011-11-30 NOTE — Patient Instructions (Addendum)
I hope you feel better soon.  If you don't feel better by next week, please call back.  i have sent a prescription to your pharmacy, for the dizziness.

## 2011-11-30 NOTE — Progress Notes (Signed)
Subjective:    Patient ID: Pamela Callahan, female    DOB: 1928/09/17, 78 y.o.   MRN: [id number]  HPI Pt had mri of the brain for dizziness.  It showed ascvd.  Pt now states 1 day of moderate vertigenous- quality dizziness.  She says she has had these sxs in the past.  sxs are much better, but not quite resolved.  No assoc LOC Past Medical History  Diagnosis Date  . Depression   . Hypertension   . Anxiety   . GERD (gastroesophageal reflux disease)   . Allergy   . Hyperlipidemia   . Osteopenia   . Low back pain     Past Surgical History  Procedure Date  . Cholecystectomy 2006  . Total hip arthroplasty 2008    Right    History   Social History  . Marital Status: Married    Spouse Name: N/A    Number of Children: N/A  . Years of Education: N/A   Occupational History  . Retired PhD   . 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 Smoking status: Never Smoker   . Smokeless tobacco: Never Used  . Alcohol Use: No  . Drug Use: No  . Sexually Active: Not on file   Other Topics Concern  . Not on file   Social History Narrative  . No narrative on file    Current Outpatient Prescriptions on File Prior to Visit  Medication Sig Dispense Refill  . aspirin 81 MG tablet Take 81 mg by mouth daily.        Marland Kitchen BENICAR 20 MG tablet TAKE 1 TABLET EVERY DAY  90 tablet  3  . Cholecalciferol (VITAMIN D3) 1000 UNITS CAPS Take by mouth.        . clorazepate (TRANXENE-T) 7.5 MG tablet Take 1 tablet (7.5 mg total) by mouth 3 (three) times daily.  270 tablet  1  . EVISTA 60 MG tablet TAKE 1 TABLET EVERY DAY  90 tablet  3  . fexofenadine (ALLEGRA) 180 MG tablet Take 1 tablet (180 mg total) by mouth daily.  90 tablet  2  . fluticasone (FLONASE) 50 MCG/ACT nasal spray Place 1 spray into the nose daily. As needed       . hydrochlorothiazide (,MICROZIDE/HYDRODIURIL,) 12.5 MG capsule Take 1 capsule (12.5 mg total) by mouth every morning.  90 capsule  2  . Magnesium Gluconate 550 MG  TABS Take by mouth daily.        . Multiple Vitamins-Minerals (PRESERVISION/LUTEIN) CAPS Take by mouth 2 (two) times daily.        . simvastatin (ZOCOR) 10 MG tablet Take 1 tablet (10 mg total) by mouth at bedtime.  90 tablet  2  . triamcinolone (KENALOG) 0.5 % cream Apply topically 2 (two) times daily. Use prn rash  45 g  3    Allergies  Allergen Reactions  . Ace Inhibitors     REACTION: cough  . Penicillins     Family History  Problem Relation Age of Onset  . Hypertension Other   . Hypertension Mother     BP 134/88  Pulse 70  Temp(Src) 97.6 F (36.4 C) (Oral)  Ht 5\' 10"  (1.778 m)  Wt 170 lb (77.111 kg)  BMI 24.39 kg/m2  SpO2 96%    Review of Systems Denies n/v, chest pain, falls, and visual loss.  She had slight headache.   Objective:   Physical Exam VS: see  vs page GEN: no distress HEAD: head: no deformity eyes: no periorbital swelling, no proptosis external nose and ears are normal mouth: no lesion seen Both eac's and tm's are normal NECK: supple, thyroid is not enlarged CHEST WALL: no deformity LUNGS:  Clear to auscultation CV: reg rate and rhythm.  Soft systolic murmur MUSCULOSKELETAL: muscle bulk and strength are grossly normal.  no obvious joint swelling.  gait is normal and steady EXTEMITIES: no deformity.  no edema PULSES: dorsalis pedis intact bilat.  no carotid bruit NEURO:  cn 2-12 grossly intact.   readily moves all 4's.  sensation is intact to touch on the feet SKIN:  Normal texture and temperature.  No rash or suspicious lesion is visible.   NODES:  None palpable at the neck PSYCH: alert, oriented x3.  Does not appear anxious nor depressed.      (i reviewed 2004 mri and mra)    Assessment & Plan:  Dizziness, recurrent, uncertain etiology

## 2011-12-21 ENCOUNTER — Ambulatory Visit (INDEPENDENT_AMBULATORY_CARE_PROVIDER_SITE_OTHER): Payer: Medicare Other | Admitting: Internal Medicine

## 2011-12-21 ENCOUNTER — Encounter: Payer: Self-pay | Admitting: Internal Medicine

## 2011-12-21 VITALS — BP 138/90 | HR 80 | Temp 96.6°F | Resp 16 | Wt 171.0 lb

## 2011-12-21 DIAGNOSIS — M545 Low back pain: Secondary | ICD-10-CM

## 2011-12-21 DIAGNOSIS — I10 Essential (primary) hypertension: Secondary | ICD-10-CM

## 2011-12-21 DIAGNOSIS — M1990 Unspecified osteoarthritis, unspecified site: Secondary | ICD-10-CM

## 2011-12-21 DIAGNOSIS — F329 Major depressive disorder, single episode, unspecified: Secondary | ICD-10-CM

## 2011-12-21 DIAGNOSIS — E785 Hyperlipidemia, unspecified: Secondary | ICD-10-CM

## 2011-12-21 DIAGNOSIS — F411 Generalized anxiety disorder: Secondary | ICD-10-CM

## 2011-12-21 MED ORDER — SIMVASTATIN 10 MG PO TABS: 10.0000 mg | ORAL_TABLET | Freq: Every day | ORAL | Status: DC

## 2011-12-21 MED ORDER — FLUTICASONE PROPIONATE 50 MCG/ACT NA SUSP: 1.0000 | Freq: Every day | NASAL | Status: DC

## 2011-12-21 MED ORDER — TRIAMCINOLONE ACETONIDE 0.5 % EX CREA: TOPICAL_CREAM | Freq: Two times a day (BID) | CUTANEOUS | Status: DC

## 2011-12-21 MED ORDER — RALOXIFENE HCL 60 MG PO TABS: 60.0000 mg | ORAL_TABLET | Freq: Every day | ORAL | Status: DC

## 2011-12-21 MED ORDER — HYDROCHLOROTHIAZIDE 12.5 MG PO CAPS: 12.5000 mg | ORAL_CAPSULE | ORAL | Status: DC

## 2011-12-21 MED ORDER — CLORAZEPATE DIPOTASSIUM 7.5 MG PO TABS: 7.5000 mg | ORAL_TABLET | Freq: Three times a day (TID) | ORAL | Status: DC

## 2011-12-21 MED ORDER — FEXOFENADINE HCL 180 MG PO TABS: 180.0000 mg | ORAL_TABLET | Freq: Every day | ORAL | Status: DC

## 2011-12-21 MED ORDER — OLMESARTAN MEDOXOMIL 20 MG PO TABS: 20.0000 mg | ORAL_TABLET | Freq: Every day | ORAL | Status: DC

## 2011-12-21 NOTE — Assessment & Plan Note (Signed)
Continue with current prescription therapy as reflected on the Med list.  

## 2011-12-21 NOTE — Progress Notes (Signed)
Patient ID: Pamela Callahan, female   DOB: 24-Jul-1929, 78 y.o.   MRN: [id number]  Subjective:    Patient ID: Pamela Callahan, female    DOB: [birth year], 78 y.o.   MRN: [id number]  HPI   The patient is here to follow up on chronic LBP,  depression, anxiety, headaches and chronic moderate fibromyalgia symptoms controlled with medicines, diet and exercise. Her dizzy spell has resolved  Wt Readings from Last 3 Encounters:  12/21/11 171 lb (77.565 kg)  11/30/11 170 lb (77.111 kg)  08/09/11 176 lb 9.6 oz (80.105 kg)   BP Readings from Last 3 Encounters:  12/21/11 138/90  11/30/11 134/88  08/09/11 110/62       Review of Systems  Constitutional: Negative for chills, activity change, appetite change, fatigue and unexpected weight change.  HENT: Negative for congestion, mouth sores and sinus pressure.   Eyes: Negative for visual disturbance.  Respiratory: Negative for cough and chest tightness.   Gastrointestinal: Negative for nausea and abdominal pain.  Genitourinary: Negative for frequency, difficulty urinating and vaginal pain.  Musculoskeletal: Positive for back pain. Negative for gait problem.  Skin: Negative for pallor and rash.  Neurological: Negative for dizziness, tremors, weakness, numbness and headaches.  Psychiatric/Behavioral: Negative for confusion and sleep disturbance.       Objective:   Physical Exam  Constitutional: She appears well-developed and well-nourished. No distress.  HENT:  Head: Normocephalic.  Right Ear: External ear normal.  Left Ear: External ear normal.  Nose: Nose normal.  Mouth/Throat: Oropharynx is clear and moist.  Eyes: Conjunctivae are normal. Pupils are equal, round, and reactive to light. Right eye exhibits no discharge. Left eye exhibits no discharge.  Neck: Normal range of motion. Neck supple. No JVD present. No tracheal deviation present. No thyromegaly present.  Cardiovascular: Normal rate, regular rhythm and normal heart sounds.     Pulmonary/Chest: No stridor. No respiratory distress. She has no wheezes.  Abdominal: Soft. Bowel sounds are normal. She exhibits no distension and no mass. There is no tenderness. There is no rebound and no guarding.  Musculoskeletal: She exhibits tenderness (LS is tender w/ROM). She exhibits no edema.  Lymphadenopathy:    She has no cervical adenopathy.  Neurological: She displays normal reflexes. No cranial nerve deficit. She exhibits normal muscle tone. Coordination normal.  Skin: No rash noted. No erythema.  Psychiatric: She has a normal mood and affect. Her behavior is normal. Judgment and thought content normal.          Assessment & Plan:

## 2011-12-28 ENCOUNTER — Telehealth: Payer: Self-pay | Admitting: Internal Medicine

## 2011-12-28 NOTE — Telephone Encounter (Signed)
The pt's husband called and is worried about the pt's high blood pressure.  He states it has never been high before, but during her apt last week, it was in the 90's.  He stated he called triage several times, but was unable to get through.    #[phone]

## 2011-12-28 NOTE — Telephone Encounter (Signed)
Pt's spouse advised that MD recommended pt continue with same medications for HTN per OV note.

## 2012-01-11 DIAGNOSIS — Z1231 Encounter for screening mammogram for malignant neoplasm of breast: Secondary | ICD-10-CM | POA: Diagnosis not present

## 2012-01-24 ENCOUNTER — Other Ambulatory Visit: Payer: Self-pay | Admitting: Internal Medicine

## 2012-01-27 ENCOUNTER — Encounter: Payer: Self-pay | Admitting: Internal Medicine

## 2012-01-27 DIAGNOSIS — Z961 Presence of intraocular lens: Secondary | ICD-10-CM | POA: Diagnosis not present

## 2012-01-27 DIAGNOSIS — H353190 Nonexudative age-related macular degeneration, unspecified eye, stage unspecified: Secondary | ICD-10-CM | POA: Diagnosis not present

## 2012-01-27 DIAGNOSIS — H2510 Age-related nuclear cataract, unspecified eye: Secondary | ICD-10-CM | POA: Diagnosis not present

## 2012-01-27 DIAGNOSIS — H353290 Exudative age-related macular degeneration, unspecified eye, stage unspecified: Secondary | ICD-10-CM | POA: Diagnosis not present

## 2012-03-03 ENCOUNTER — Other Ambulatory Visit: Payer: Self-pay | Admitting: Internal Medicine

## 2012-06-01 DIAGNOSIS — H2510 Age-related nuclear cataract, unspecified eye: Secondary | ICD-10-CM | POA: Diagnosis not present

## 2012-06-01 DIAGNOSIS — H353190 Nonexudative age-related macular degeneration, unspecified eye, stage unspecified: Secondary | ICD-10-CM | POA: Diagnosis not present

## 2012-06-01 DIAGNOSIS — H353290 Exudative age-related macular degeneration, unspecified eye, stage unspecified: Secondary | ICD-10-CM | POA: Diagnosis not present

## 2012-06-01 DIAGNOSIS — Z961 Presence of intraocular lens: Secondary | ICD-10-CM | POA: Diagnosis not present

## 2012-06-07 ENCOUNTER — Ambulatory Visit (INDEPENDENT_AMBULATORY_CARE_PROVIDER_SITE_OTHER): Payer: Medicare Other | Admitting: Internal Medicine

## 2012-06-07 ENCOUNTER — Encounter: Payer: Self-pay | Admitting: Internal Medicine

## 2012-06-07 VITALS — BP 128/80 | HR 72 | Temp 96.9°F | Resp 16 | Wt 177.0 lb

## 2012-06-07 DIAGNOSIS — F411 Generalized anxiety disorder: Secondary | ICD-10-CM | POA: Diagnosis not present

## 2012-06-07 DIAGNOSIS — Z23 Encounter for immunization: Secondary | ICD-10-CM

## 2012-06-07 DIAGNOSIS — M25569 Pain in unspecified knee: Secondary | ICD-10-CM

## 2012-06-07 DIAGNOSIS — M545 Low back pain: Secondary | ICD-10-CM | POA: Diagnosis not present

## 2012-06-07 DIAGNOSIS — M25562 Pain in left knee: Secondary | ICD-10-CM | POA: Insufficient documentation

## 2012-06-07 NOTE — Assessment & Plan Note (Signed)
Chronic, recurrent 11/13 L sciatica Refused steroids Celebrex bid Ortho cons Dr Charlann Boxer

## 2012-06-07 NOTE — Patient Instructions (Addendum)
Celebrex 200 mg twice a day for several days Stretch

## 2012-06-07 NOTE — Progress Notes (Signed)
Patient ID: Pamela Callahan, female   DOB: August 27, 1928, 78 y.o.   MRN: [id number] Patient ID: Pamela Callahan, female   DOB: 06-15-1929, 78 y.o.   MRN: [id number]  Subjective:    Patient ID: Pamela Callahan, female    DOB: [birth year], 78 y.o.   MRN: [id number]  Knee Pain  Pertinent negatives include no numbness.  Back Pain Pertinent negatives include no abdominal pain, headaches, numbness or weakness.     The patient is here to follow up on chronic LBP,  depression, anxiety, headaches and chronic moderate fibromyalgia symptoms controlled with medicines, diet and exercise. Her dizzy spell has resolved  Wt Readings from Last 3 Encounters:  06/07/12 177 lb (80.287 kg)  12/21/11 171 lb (77.565 kg)  11/30/11 170 lb (77.111 kg)   BP Readings from Last 3 Encounters:  06/07/12 128/80  12/21/11 138/90  11/30/11 134/88       Review of Systems  Constitutional: Negative for chills, activity change, appetite change, fatigue and unexpected weight change.  HENT: Negative for congestion, mouth sores and sinus pressure.   Eyes: Negative for visual disturbance.  Respiratory: Negative for cough and chest tightness.   Gastrointestinal: Negative for nausea and abdominal pain.  Genitourinary: Negative for frequency, difficulty urinating and vaginal pain.  Musculoskeletal: Positive for back pain. Negative for gait problem.  Skin: Negative for pallor and rash.  Neurological: Negative for dizziness, tremors, weakness, numbness and headaches.  Psychiatric/Behavioral: Negative for confusion and sleep disturbance.       Objective:   Physical Exam  Constitutional: She appears well-developed and well-nourished. No distress.  HENT:  Head: Normocephalic.  Right Ear: External ear normal.  Left Ear: External ear normal.  Nose: Nose normal.  Mouth/Throat: Oropharynx is clear and moist.  Eyes: Conjunctivae normal are normal. Pupils are equal, round, and reactive to light. Right eye exhibits no discharge. Left  eye exhibits no discharge.  Neck: Normal range of motion. Neck supple. No JVD present. No tracheal deviation present. No thyromegaly present.  Cardiovascular: Normal rate, regular rhythm and normal heart sounds.   Pulmonary/Chest: No stridor. No respiratory distress. She has no wheezes.  Abdominal: Soft. Bowel sounds are normal. She exhibits no distension and no mass. There is no tenderness. There is no rebound and no guarding.  Musculoskeletal: She exhibits tenderness (LS is tender w/ROM). She exhibits no edema.  Lymphadenopathy:    She has no cervical adenopathy.  Neurological: She displays normal reflexes. No cranial nerve deficit. She exhibits normal muscle tone. Coordination normal.  Skin: No rash noted. No erythema.  Psychiatric: She has a normal mood and affect. Her behavior is normal. Judgment and thought content normal.    Lab Results  Component Value Date   WBC 5.6 03/16/2011   HGB 12.7 03/16/2011   HCT 36.7 03/16/2011   PLT 184.0 03/16/2011   GLUCOSE 88 03/16/2011   CHOL 154 03/16/2011   TRIG 105.0 03/16/2011   HDL 67.10 03/16/2011   LDLDIRECT 64.8 09/23/2008   LDLCALC 66 03/16/2011   ALT 17 03/16/2011   AST 21 03/16/2011   NA 140 03/16/2011   K 4.0 03/16/2011   CL 104 03/16/2011   CREATININE 0.8 03/16/2011   BUN 14 03/16/2011   CO2 26 03/16/2011   TSH 2.49 03/16/2011   INR 1.0 01/17/2008         Assessment & Plan:

## 2012-06-07 NOTE — Assessment & Plan Note (Signed)
Continue with current prescription therapy as reflected on the Med list.  

## 2012-06-07 NOTE — Assessment & Plan Note (Signed)
Celebrex 200 mg qd

## 2012-06-09 DIAGNOSIS — M545 Low back pain: Secondary | ICD-10-CM | POA: Diagnosis not present

## 2012-06-09 DIAGNOSIS — M25569 Pain in unspecified knee: Secondary | ICD-10-CM | POA: Diagnosis not present

## 2012-07-04 ENCOUNTER — Encounter: Payer: Medicare Other | Admitting: Internal Medicine

## 2012-07-13 DIAGNOSIS — H353190 Nonexudative age-related macular degeneration, unspecified eye, stage unspecified: Secondary | ICD-10-CM | POA: Diagnosis not present

## 2012-07-13 DIAGNOSIS — H353290 Exudative age-related macular degeneration, unspecified eye, stage unspecified: Secondary | ICD-10-CM | POA: Diagnosis not present

## 2012-07-17 DIAGNOSIS — Q120 Congenital cataract: Secondary | ICD-10-CM | POA: Diagnosis not present

## 2012-07-17 DIAGNOSIS — H5710 Ocular pain, unspecified eye: Secondary | ICD-10-CM | POA: Diagnosis not present

## 2012-07-17 DIAGNOSIS — H25049 Posterior subcapsular polar age-related cataract, unspecified eye: Secondary | ICD-10-CM | POA: Diagnosis not present

## 2012-07-17 DIAGNOSIS — H4450 Unspecified degenerated conditions of globe: Secondary | ICD-10-CM | POA: Diagnosis not present

## 2012-07-17 DIAGNOSIS — H3530 Unspecified macular degeneration: Secondary | ICD-10-CM | POA: Diagnosis not present

## 2012-07-20 DIAGNOSIS — Z961 Presence of intraocular lens: Secondary | ICD-10-CM | POA: Diagnosis not present

## 2012-07-20 DIAGNOSIS — H353190 Nonexudative age-related macular degeneration, unspecified eye, stage unspecified: Secondary | ICD-10-CM | POA: Diagnosis not present

## 2012-07-20 DIAGNOSIS — H353290 Exudative age-related macular degeneration, unspecified eye, stage unspecified: Secondary | ICD-10-CM | POA: Diagnosis not present

## 2012-07-20 DIAGNOSIS — H2510 Age-related nuclear cataract, unspecified eye: Secondary | ICD-10-CM | POA: Diagnosis not present

## 2012-07-21 DIAGNOSIS — H2620 Unspecified complicated cataract: Secondary | ICD-10-CM | POA: Diagnosis not present

## 2012-07-21 DIAGNOSIS — I4510 Unspecified right bundle-branch block: Secondary | ICD-10-CM | POA: Diagnosis not present

## 2012-07-24 DIAGNOSIS — M129 Arthropathy, unspecified: Secondary | ICD-10-CM | POA: Diagnosis not present

## 2012-07-24 DIAGNOSIS — H2620 Unspecified complicated cataract: Secondary | ICD-10-CM | POA: Diagnosis not present

## 2012-07-24 DIAGNOSIS — Z7982 Long term (current) use of aspirin: Secondary | ICD-10-CM | POA: Diagnosis not present

## 2012-07-24 DIAGNOSIS — Z9089 Acquired absence of other organs: Secondary | ICD-10-CM | POA: Diagnosis not present

## 2012-07-24 DIAGNOSIS — Z83511 Family history of glaucoma: Secondary | ICD-10-CM | POA: Diagnosis not present

## 2012-07-24 DIAGNOSIS — Z91040 Latex allergy status: Secondary | ICD-10-CM | POA: Diagnosis not present

## 2012-07-24 DIAGNOSIS — Z961 Presence of intraocular lens: Secondary | ICD-10-CM | POA: Diagnosis not present

## 2012-07-24 DIAGNOSIS — H353290 Exudative age-related macular degeneration, unspecified eye, stage unspecified: Secondary | ICD-10-CM | POA: Diagnosis not present

## 2012-07-24 DIAGNOSIS — Z79899 Other long term (current) drug therapy: Secondary | ICD-10-CM | POA: Diagnosis not present

## 2012-07-24 DIAGNOSIS — Z91041 Radiographic dye allergy status: Secondary | ICD-10-CM | POA: Diagnosis not present

## 2012-07-24 DIAGNOSIS — H59029 Cataract (lens) fragments in eye following cataract surgery, unspecified eye: Secondary | ICD-10-CM | POA: Diagnosis not present

## 2012-07-24 DIAGNOSIS — Z880 Allergy status to penicillin: Secondary | ICD-10-CM | POA: Diagnosis not present

## 2012-07-24 DIAGNOSIS — T8529XA Other mechanical complication of intraocular lens, initial encounter: Secondary | ICD-10-CM | POA: Diagnosis not present

## 2012-07-24 DIAGNOSIS — H2510 Age-related nuclear cataract, unspecified eye: Secondary | ICD-10-CM | POA: Diagnosis not present

## 2012-07-24 DIAGNOSIS — Z9889 Other specified postprocedural states: Secondary | ICD-10-CM | POA: Diagnosis not present

## 2012-07-24 DIAGNOSIS — H26109 Unspecified traumatic cataract, unspecified eye: Secondary | ICD-10-CM | POA: Diagnosis not present

## 2012-07-24 DIAGNOSIS — Z9849 Cataract extraction status, unspecified eye: Secondary | ICD-10-CM | POA: Diagnosis not present

## 2012-07-24 DIAGNOSIS — I10 Essential (primary) hypertension: Secondary | ICD-10-CM | POA: Diagnosis not present

## 2012-07-24 DIAGNOSIS — Z83518 Family history of other specified eye disorder: Secondary | ICD-10-CM | POA: Diagnosis not present

## 2012-07-24 DIAGNOSIS — Z8249 Family history of ischemic heart disease and other diseases of the circulatory system: Secondary | ICD-10-CM | POA: Diagnosis not present

## 2012-07-25 DIAGNOSIS — H353290 Exudative age-related macular degeneration, unspecified eye, stage unspecified: Secondary | ICD-10-CM | POA: Diagnosis not present

## 2012-07-25 DIAGNOSIS — Z8759 Personal history of other complications of pregnancy, childbirth and the puerperium: Secondary | ICD-10-CM | POA: Diagnosis not present

## 2012-07-26 ENCOUNTER — Other Ambulatory Visit: Payer: Self-pay | Admitting: Internal Medicine

## 2012-07-27 NOTE — Telephone Encounter (Signed)
Ok to Rf in PCP absence? 

## 2012-07-27 NOTE — Telephone Encounter (Signed)
Stacey, Ok to refill Regina 

## 2012-08-07 DIAGNOSIS — H353290 Exudative age-related macular degeneration, unspecified eye, stage unspecified: Secondary | ICD-10-CM | POA: Diagnosis not present

## 2012-08-07 DIAGNOSIS — Z9849 Cataract extraction status, unspecified eye: Secondary | ICD-10-CM | POA: Diagnosis not present

## 2012-08-07 DIAGNOSIS — H353190 Nonexudative age-related macular degeneration, unspecified eye, stage unspecified: Secondary | ICD-10-CM | POA: Diagnosis not present

## 2012-08-07 DIAGNOSIS — Z48810 Encounter for surgical aftercare following surgery on the sense organs: Secondary | ICD-10-CM | POA: Diagnosis not present

## 2012-08-07 DIAGNOSIS — Z961 Presence of intraocular lens: Secondary | ICD-10-CM | POA: Diagnosis not present

## 2012-08-22 DIAGNOSIS — Z48810 Encounter for surgical aftercare following surgery on the sense organs: Secondary | ICD-10-CM | POA: Diagnosis not present

## 2012-08-22 DIAGNOSIS — Z961 Presence of intraocular lens: Secondary | ICD-10-CM | POA: Diagnosis not present

## 2012-09-06 ENCOUNTER — Other Ambulatory Visit (INDEPENDENT_AMBULATORY_CARE_PROVIDER_SITE_OTHER): Payer: Medicare Other

## 2012-09-06 ENCOUNTER — Ambulatory Visit (INDEPENDENT_AMBULATORY_CARE_PROVIDER_SITE_OTHER): Payer: Medicare Other | Admitting: Internal Medicine

## 2012-09-06 ENCOUNTER — Encounter: Payer: Self-pay | Admitting: Internal Medicine

## 2012-09-06 VITALS — BP 132/84 | HR 80 | Temp 97.5°F | Resp 16 | Ht 69.0 in | Wt 169.0 lb

## 2012-09-06 DIAGNOSIS — Z0000 Encounter for general adult medical examination without abnormal findings: Secondary | ICD-10-CM

## 2012-09-06 DIAGNOSIS — F329 Major depressive disorder, single episode, unspecified: Secondary | ICD-10-CM

## 2012-09-06 DIAGNOSIS — M545 Low back pain, unspecified: Secondary | ICD-10-CM

## 2012-09-06 DIAGNOSIS — F411 Generalized anxiety disorder: Secondary | ICD-10-CM

## 2012-09-06 DIAGNOSIS — F3289 Other specified depressive episodes: Secondary | ICD-10-CM

## 2012-09-06 DIAGNOSIS — M25569 Pain in unspecified knee: Secondary | ICD-10-CM

## 2012-09-06 DIAGNOSIS — M25562 Pain in left knee: Secondary | ICD-10-CM

## 2012-09-06 DIAGNOSIS — E785 Hyperlipidemia, unspecified: Secondary | ICD-10-CM

## 2012-09-06 DIAGNOSIS — I10 Essential (primary) hypertension: Secondary | ICD-10-CM

## 2012-09-06 LAB — URINALYSIS
Hgb urine dipstick: NEGATIVE
Ketones, ur: NEGATIVE
Leukocytes, UA: NEGATIVE
Specific Gravity, Urine: <=1.005 (ref 1.000–1.030)
Urobilinogen, UA: 0.2 (ref 0.0–1.0)

## 2012-09-06 LAB — LIPID PANEL
Cholesterol: 146 mg/dL (ref 0–200)
HDL: 63.9 mg/dL (ref >39.00)
LDL Cholesterol: 61 mg/dL (ref 0–99)
Triglycerides: 106 mg/dL (ref 0.0–149.0)
VLDL: 21.2 mg/dL (ref 0.0–40.0)

## 2012-09-06 LAB — BASIC METABOLIC PANEL
BUN: 12 mg/dL (ref 6–23)
Calcium: 9.5 mg/dL (ref 8.4–10.5)
GFR: 58.49 mL/min — ABNORMAL LOW (ref >60.00)
Potassium: 5.1 mEq/L (ref 3.5–5.1)

## 2012-09-06 LAB — CBC WITH DIFFERENTIAL/PLATELET
Basophils Absolute: 0 10*3/uL (ref 0.0–0.1)
Eosinophils Relative: 1.7 % (ref 0.0–5.0)
HCT: 38 % (ref 36.0–46.0)
Lymphocytes Relative: 27.5 % (ref 12.0–46.0)
Monocytes Relative: 9.3 % (ref 3.0–12.0)
Neutrophils Relative %: 60.8 % (ref 43.0–77.0)
Platelets: 184 10*3/uL (ref 150.0–400.0)
WBC: 5.8 10*3/uL (ref 4.5–10.5)

## 2012-09-06 LAB — HEPATIC FUNCTION PANEL
ALT: 19 U/L (ref 0–35)
Albumin: 4.2 g/dL (ref 3.5–5.2)
Total Protein: 7.1 g/dL (ref 6.0–8.3)

## 2012-09-06 LAB — TSH: TSH: 2.95 u[IU]/mL (ref 0.35–5.50)

## 2012-09-06 NOTE — Assessment & Plan Note (Signed)
Continue with current prescription therapy as reflected on the Med list.  

## 2012-09-06 NOTE — Assessment & Plan Note (Signed)
Better  

## 2012-09-06 NOTE — Assessment & Plan Note (Signed)
The patient is here for annual Medicare wellness examination and management of other chronic and acute problems.   The risk factors are reflected in the social history.  The roster of all physicians providing medical care to patient - is listed in the Snapshot section of the chart.  Activities of daily living:  The patient is 100% inedpendent in all ADLs: dressing, toileting, feeding as well as independent mobility  Home safety : The patient has smoke detectors in the home. They wear seatbelts.No firearms at home ( firearms are present in the home, kept in a safe fashion). There is no violence in the home.   There is no risks for hepatitis, STDs or HIV. There is no   history of blood transfusion. They have no travel history to infectious disease endemic areas of the world.  The patient has (has not) seen their dentist in the last six month. They have (not) seen their eye doctor in the last year. They deny (admit to) any hearing difficulty and have not had audiologic testing in the last year.  They do not  have excessive sun exposure. Discussed the need for sun protection: hats, long sleeves and use of sunscreen if there is significant sun exposure.   Diet: the importance of a healthy diet is discussed. They do have a healthy (unhealthy-high fat/fast food) diet.  The patient has a regular exercise program: ____20___ , _min___duration, ___3__per week and house/yardwork.  The benefits of regular aerobic exercise were discussed.  Depression screen: there are no signs or vegative symptoms of depression- irritability, change in appetite, anhedonia, sadness/tearfullness.  Cognitive assessment: the patient manages all their financial and personal affairs and is actively engaged. They could relate day,date,year and events; recalled 3/3 objects at 3 minutes; performed clock-face test normally.  The following portions of the patient's history were reviewed and updated as appropriate: allergies, current  medications, past family history, past medical history,  past surgical history, past social history  and problem list.  Vision, hearing, body mass index were assessed and reviewed.   During the course of the visit the patient was educated and counseled about appropriate screening and preventive services including : fall prevention , diabetes screening, nutrition counseling, colorectal cancer screening, and recommended immunizations.

## 2012-09-06 NOTE — Progress Notes (Signed)
Subjective:     The patient is here for a wellness exam. The patient has been doing well overall without major physical or psychological issues going on lately. The patient needs to address  chronic hypertension that has been well controlled with medicines; to address chronic  OA/LBP controlled with medicines as well; and to address anxiety, controlled with medical treatment.  Back Pain This is a chronic problem. The current episode started more than 1 year ago. The problem occurs every several days. The quality of the pain is described as aching. The pain is moderate. Pertinent negatives include no abdominal pain, headaches or weakness. She has tried analgesics for the symptoms. The treatment provided mild relief.     The patient is here to follow up on chronic LBP,  depression, anxiety, headaches and chronic moderate fibromyalgia symptoms controlled with medicines, diet and exercise. Her dizzy spell has resolved  Wt Readings from Last 3 Encounters:  09/06/12 169 lb (76.658 kg)  06/07/12 177 lb (80.287 kg)  12/21/11 171 lb (77.565 kg)   BP Readings from Last 3 Encounters:  09/06/12 132/84  06/07/12 128/80  12/21/11 138/90       Review of Systems  Constitutional: Negative for chills, activity change, appetite change, fatigue and unexpected weight change.  HENT: Negative for congestion, mouth sores and sinus pressure.   Eyes: Negative for visual disturbance.  Respiratory: Negative for cough and chest tightness.   Gastrointestinal: Negative for nausea and abdominal pain.  Genitourinary: Negative for frequency, difficulty urinating and vaginal pain.  Musculoskeletal: Positive for back pain. Negative for gait problem.  Skin: Negative for pallor and rash.  Neurological: Negative for dizziness, tremors, weakness and headaches.  Psychiatric/Behavioral: Negative for confusion and sleep disturbance.       Objective:   Physical Exam  Constitutional: She appears well-developed and  well-nourished. No distress.  HENT:  Head: Normocephalic.  Right Ear: External ear normal.  Left Ear: External ear normal.  Nose: Nose normal.  Mouth/Throat: Oropharynx is clear and moist.  Eyes: Conjunctivae normal are normal. Pupils are equal, round, and reactive to light. Right eye exhibits no discharge. Left eye exhibits no discharge.  Neck: Normal range of motion. Neck supple. No JVD present. No tracheal deviation present. No thyromegaly present.  Cardiovascular: Normal rate, regular rhythm and normal heart sounds.   Pulmonary/Chest: No stridor. No respiratory distress. She has no wheezes.  Abdominal: Soft. Bowel sounds are normal. She exhibits no distension and no mass. There is no tenderness. There is no rebound and no guarding.  Musculoskeletal: She exhibits tenderness (LS is tender w/ROM). She exhibits no edema.  Lymphadenopathy:    She has no cervical adenopathy.  Neurological: She displays normal reflexes. No cranial nerve deficit. She exhibits normal muscle tone. Coordination normal.  Skin: No rash noted. No erythema.  Psychiatric: She has a normal mood and affect. Her behavior is normal. Judgment and thought content normal.    Lab Results  Component Value Date   WBC 5.6 03/16/2011   HGB 12.7 03/16/2011   HCT 36.7 03/16/2011   PLT 184.0 03/16/2011   GLUCOSE 88 03/16/2011   CHOL 154 03/16/2011   TRIG 105.0 03/16/2011   HDL 67.10 03/16/2011   LDLDIRECT 64.8 09/23/2008   LDLCALC 66 03/16/2011   ALT 17 03/16/2011   AST 21 03/16/2011   NA 140 03/16/2011   K 4.0 03/16/2011   CL 104 03/16/2011   CREATININE 0.8 03/16/2011   BUN 14 03/16/2011   CO2 26 03/16/2011  TSH 2.49 03/16/2011   INR 1.0 01/17/2008         Assessment & Plan:

## 2012-09-07 DIAGNOSIS — Z961 Presence of intraocular lens: Secondary | ICD-10-CM | POA: Diagnosis not present

## 2012-09-07 DIAGNOSIS — Z48810 Encounter for surgical aftercare following surgery on the sense organs: Secondary | ICD-10-CM | POA: Diagnosis not present

## 2012-09-07 DIAGNOSIS — Z9849 Cataract extraction status, unspecified eye: Secondary | ICD-10-CM | POA: Diagnosis not present

## 2012-09-18 ENCOUNTER — Emergency Department (HOSPITAL_BASED_OUTPATIENT_CLINIC_OR_DEPARTMENT_OTHER)
Admission: EM | Admit: 2012-09-18 | Discharge: 2012-09-18 | Disposition: A | Discharge status: Home / Self Care | Payer: Medicare Other | Attending: Emergency Medicine | Admitting: Emergency Medicine

## 2012-09-18 ENCOUNTER — Encounter (HOSPITAL_BASED_OUTPATIENT_CLINIC_OR_DEPARTMENT_OTHER): Payer: Self-pay | Admitting: Family Medicine

## 2012-09-18 ENCOUNTER — Emergency Department (HOSPITAL_BASED_OUTPATIENT_CLINIC_OR_DEPARTMENT_OTHER): Payer: Medicare Other

## 2012-09-18 DIAGNOSIS — S8000XA Contusion of unspecified knee, initial encounter: Secondary | ICD-10-CM | POA: Diagnosis not present

## 2012-09-18 DIAGNOSIS — F329 Major depressive disorder, single episode, unspecified: Secondary | ICD-10-CM | POA: Diagnosis not present

## 2012-09-18 DIAGNOSIS — R269 Unspecified abnormalities of gait and mobility: Secondary | ICD-10-CM | POA: Insufficient documentation

## 2012-09-18 DIAGNOSIS — X500XXA Overexertion from strenuous movement or load, initial encounter: Secondary | ICD-10-CM | POA: Insufficient documentation

## 2012-09-18 DIAGNOSIS — Z7982 Long term (current) use of aspirin: Secondary | ICD-10-CM | POA: Insufficient documentation

## 2012-09-18 DIAGNOSIS — S82899A Other fracture of unspecified lower leg, initial encounter for closed fracture: Secondary | ICD-10-CM

## 2012-09-18 DIAGNOSIS — Z79899 Other long term (current) drug therapy: Secondary | ICD-10-CM | POA: Insufficient documentation

## 2012-09-18 DIAGNOSIS — F3289 Other specified depressive episodes: Secondary | ICD-10-CM | POA: Insufficient documentation

## 2012-09-18 DIAGNOSIS — Y92009 Unspecified place in unspecified non-institutional (private) residence as the place of occurrence of the external cause: Secondary | ICD-10-CM | POA: Insufficient documentation

## 2012-09-18 DIAGNOSIS — I10 Essential (primary) hypertension: Secondary | ICD-10-CM | POA: Diagnosis not present

## 2012-09-18 DIAGNOSIS — S8263XA Displaced fracture of lateral malleolus of unspecified fibula, initial encounter for closed fracture: Secondary | ICD-10-CM | POA: Diagnosis not present

## 2012-09-18 DIAGNOSIS — K219 Gastro-esophageal reflux disease without esophagitis: Secondary | ICD-10-CM | POA: Insufficient documentation

## 2012-09-18 DIAGNOSIS — E785 Hyperlipidemia, unspecified: Secondary | ICD-10-CM | POA: Insufficient documentation

## 2012-09-18 DIAGNOSIS — M899 Disorder of bone, unspecified: Secondary | ICD-10-CM | POA: Insufficient documentation

## 2012-09-18 DIAGNOSIS — Y9389 Activity, other specified: Secondary | ICD-10-CM | POA: Insufficient documentation

## 2012-09-18 DIAGNOSIS — F411 Generalized anxiety disorder: Secondary | ICD-10-CM | POA: Insufficient documentation

## 2012-09-18 DIAGNOSIS — IMO0002 Reserved for concepts with insufficient information to code with codable children: Secondary | ICD-10-CM | POA: Diagnosis not present

## 2012-09-18 NOTE — ED Notes (Signed)
Patient transported to X-ray 

## 2012-09-18 NOTE — ED Provider Notes (Signed)
I have personally seen and examined the patient.  I have discussed the plan of care with the resident.  I have reviewed the documentation on PMH/FH/Soc. History.  I have reviewed the documentation of the resident and agree.   Joya Gaskins, MD 09/18/12 [phone]

## 2012-09-18 NOTE — ED Provider Notes (Signed)
History     CSN: [id number]  Arrival date & time 09/18/12  1149   First MD Initiated Contact with Patient 09/18/12 1159      Chief Complaint  Patient presents with  . Ankle Pain    HPI Pt is an 79 yo active F who presents with left foot and ankle pain after injury 5 days ago. Pt states she stood up from a chair and twisted her ankle on a throw rug in her living room. She had immediate pain and swelling, but did not hear any pops. She did fall down and hit her knee, which is bruised and has an abrasion but is not bothering her at this time. She states she then re-injured the same ankle by the same mechanism a few days later. She has been able to bear weight and continue her normal daily activities. She states the bruising and edema is getting much worse which was concerning to her. She has not tried anything for pain, but did use ice packs for a few days.  Past Medical History  Diagnosis Date  . Depression   . Hypertension   . Anxiety   . GERD (gastroesophageal reflux disease)   . Allergy   . Hyperlipidemia   . Osteopenia   . Low back pain     Past Surgical History  Procedure Laterality Date  . Cholecystectomy  2006  . Total hip arthroplasty  2008    Right    Family History  Problem Relation Age of Onset  . Hypertension Other   . Hypertension Mother     History  Substance Use Topics  . Smoking status: Never Smoker   . Smokeless tobacco: Never Used  . Alcohol Use: No    OB History   Grav Para Term Preterm Abortions TAB SAB Ect Mult Living                  Review of Systems  Constitutional: Negative for fever, chills and appetite change.  HENT: Negative for sore throat.   Respiratory: Negative for shortness of breath.   Cardiovascular: Negative for chest pain.  Gastrointestinal: Negative for abdominal pain.  Musculoskeletal: Positive for joint swelling, arthralgias and gait problem.  Skin: Positive for color change and wound. Negative for rash.  All other  systems reviewed and are negative.    Allergies  Ace inhibitors and Penicillins  Home Medications   Current Outpatient Rx  Name  Route  Sig  Dispense  Refill  . aspirin 81 MG tablet   Oral   Take 81 mg by mouth daily.           . Cholecalciferol (VITAMIN D3) 1000 UNITS CAPS   Oral   Take by mouth.           . clorazepate (TRANXENE) 7.5 MG tablet      TAKE 2 TABLETS 3 TIMES A DAY   270 tablet   1   . fexofenadine (ALLEGRA) 180 MG tablet   Oral   Take 1 tablet (180 mg total) by mouth daily.   90 tablet   2   . fluticasone (FLONASE) 50 MCG/ACT nasal spray   Nasal   Place 1 spray into the nose daily. As needed   16 g   3   . hydrochlorothiazide (MICROZIDE) 12.5 MG capsule   Oral   Take 1 capsule (12.5 mg total) by mouth every morning.   90 capsule   2   . Magnesium Gluconate 550 MG TABS  Oral   Take by mouth daily.           . Multiple Vitamins-Minerals (PRESERVISION/LUTEIN) CAPS   Oral   Take by mouth 2 (two) times daily.           Marland Kitchen olmesartan (BENICAR) 20 MG tablet   Oral   Take 1 tablet (20 mg total) by mouth daily.   90 tablet   3   . prednisoLONE acetate (PRED FORTE) 1 % ophthalmic suspension   Left Eye   Place 1 drop into the left eye 4 (four) times daily.         . raloxifene (EVISTA) 60 MG tablet   Oral   Take 1 tablet (60 mg total) by mouth daily.   90 tablet   3   . simvastatin (ZOCOR) 10 MG tablet      TAKE 1 TABLET AT BEDTIME   90 tablet   2   . triamcinolone cream (KENALOG) 0.5 %      APPLY TO AFFECTED AREA TWICE A DAY TO 4 TIMES A DAY   45 g   0     BP 126/71  Pulse 72  Temp(Src) 97.8 F (36.6 C) (Oral)  Resp 18  Physical Exam  Constitutional: She appears well-developed and well-nourished. No distress.  HENT:  Head: Normocephalic and atraumatic.  Cardiovascular: Normal rate and regular rhythm.   Pulmonary/Chest: Effort normal and breath sounds normal.  Musculoskeletal:       Left knee: She exhibits  ecchymosis and laceration. She exhibits normal range of motion. No tenderness found.       Left ankle: She exhibits decreased range of motion (Able to actively flex and extend, but pain with passive adduction/abduction), swelling and ecchymosis. She exhibits no deformity, no laceration and normal pulse. Achilles tendon normal.  Skin: She is not diaphoretic.    ED Course  Procedures (including critical care time)  Labs Reviewed - No data to display Dg Ankle Complete Left  09/18/2012  *RADIOLOGY REPORT*  Clinical Data: 79 year old female status post twisting injury with lateral pain.  LEFT ANKLE COMPLETE - 3+ VIEW  Comparison: None.  Findings: Small avulsion type fracture fragments at the tip of the lateral malleolus.  Mortise joint alignment preserved.  Talar dome intact.  No definite joint effusion.  Calcaneus appears intact. Bone mineralization is within normal limits for age.  IMPRESSION: Avulsion fracture with small fracture fragments at the tip of the lateral malleolus.   Original Report Authenticated By: Erskine Speed, M.D.    Dg Foot Complete Left  09/18/2012  *RADIOLOGY REPORT*  Clinical Data: Ankle pain  LEFT FOOT - COMPLETE 3+ VIEW  Comparison: None  Findings: There is moderate lateral soft tissue swelling.  There is an avulsion fracture identified arising from the tip of the lateral malleolus.  No additional fractures or subluxations.  IMPRESSION:  1.  Tip of the lateral malleolus avulsion injury. 2.  Soft tissue swelling.   Original Report Authenticated By: Signa Kell, M.D.      1. Avulsion fracture of ankle     MDM  78 yo F with ankle injury  Patient has been able to bear weight, but edema and ecchymosis is worsening. Decreased ROM on exam. Will get ankle and foot X-rays to evaluate injury. Knee is not concerning to patient and abrasion is healing well.   1310- X-rays reviewed which shows an avulsion fracture. Will splint the ankle now with posterior splint and stirrup, and pt  states she will  use wheelchair at home to keep weight off the ankle. Son states she has an appt at [facility] at 3:00. She should be non-weight bearing until she sees orthopedist. She and son agree with this plan. Continue to use Tylenol as needed for pain.  Hilarie Fredrickson, MD 09/18/12 [phone]

## 2012-09-18 NOTE — ED Notes (Signed)
Pt sts she tripped over a rug in house and twisted her left ankle last Wednesday. Pt has swelling and bruising to left ankle.

## 2012-09-19 DIAGNOSIS — S82409A Unspecified fracture of shaft of unspecified fibula, initial encounter for closed fracture: Secondary | ICD-10-CM | POA: Diagnosis not present

## 2012-09-23 ENCOUNTER — Other Ambulatory Visit: Payer: Self-pay | Admitting: Internal Medicine

## 2012-09-28 DIAGNOSIS — H353290 Exudative age-related macular degeneration, unspecified eye, stage unspecified: Secondary | ICD-10-CM | POA: Diagnosis not present

## 2012-10-03 DIAGNOSIS — S8263XA Displaced fracture of lateral malleolus of unspecified fibula, initial encounter for closed fracture: Secondary | ICD-10-CM | POA: Diagnosis not present

## 2012-10-05 DIAGNOSIS — Z9849 Cataract extraction status, unspecified eye: Secondary | ICD-10-CM | POA: Diagnosis not present

## 2012-10-05 DIAGNOSIS — Z48810 Encounter for surgical aftercare following surgery on the sense organs: Secondary | ICD-10-CM | POA: Diagnosis not present

## 2012-10-05 DIAGNOSIS — Z961 Presence of intraocular lens: Secondary | ICD-10-CM | POA: Diagnosis not present

## 2012-10-24 DIAGNOSIS — S8290XD Unspecified fracture of unspecified lower leg, subsequent encounter for closed fracture with routine healing: Secondary | ICD-10-CM | POA: Diagnosis not present

## 2012-10-29 ENCOUNTER — Other Ambulatory Visit: Payer: Self-pay | Admitting: Internal Medicine

## 2012-11-21 DIAGNOSIS — S8263XA Displaced fracture of lateral malleolus of unspecified fibula, initial encounter for closed fracture: Secondary | ICD-10-CM | POA: Diagnosis not present

## 2012-11-28 ENCOUNTER — Other Ambulatory Visit: Payer: Self-pay | Admitting: Internal Medicine

## 2012-11-28 NOTE — Telephone Encounter (Signed)
Rx faxed to [facility].  

## 2012-11-30 DIAGNOSIS — H353290 Exudative age-related macular degeneration, unspecified eye, stage unspecified: Secondary | ICD-10-CM | POA: Diagnosis not present

## 2013-01-12 ENCOUNTER — Other Ambulatory Visit: Payer: Self-pay | Admitting: Internal Medicine

## 2013-01-20 ENCOUNTER — Other Ambulatory Visit: Payer: Self-pay | Admitting: Internal Medicine

## 2013-01-25 ENCOUNTER — Other Ambulatory Visit: Payer: Self-pay | Admitting: Internal Medicine

## 2013-02-01 DIAGNOSIS — H353290 Exudative age-related macular degeneration, unspecified eye, stage unspecified: Secondary | ICD-10-CM | POA: Diagnosis not present

## 2013-03-06 ENCOUNTER — Ambulatory Visit (INDEPENDENT_AMBULATORY_CARE_PROVIDER_SITE_OTHER): Payer: Medicare Other | Admitting: Internal Medicine

## 2013-03-06 ENCOUNTER — Encounter: Payer: Self-pay | Admitting: Internal Medicine

## 2013-03-06 VITALS — BP 110/80 | HR 80 | Temp 98.1°F | Resp 16 | Wt 163.0 lb

## 2013-03-06 DIAGNOSIS — F411 Generalized anxiety disorder: Secondary | ICD-10-CM | POA: Diagnosis not present

## 2013-03-06 DIAGNOSIS — Z0000 Encounter for general adult medical examination without abnormal findings: Secondary | ICD-10-CM

## 2013-03-06 DIAGNOSIS — I10 Essential (primary) hypertension: Secondary | ICD-10-CM | POA: Diagnosis not present

## 2013-03-06 DIAGNOSIS — M1990 Unspecified osteoarthritis, unspecified site: Secondary | ICD-10-CM

## 2013-03-06 DIAGNOSIS — F329 Major depressive disorder, single episode, unspecified: Secondary | ICD-10-CM

## 2013-03-06 DIAGNOSIS — M545 Low back pain, unspecified: Secondary | ICD-10-CM

## 2013-03-06 DIAGNOSIS — F3289 Other specified depressive episodes: Secondary | ICD-10-CM

## 2013-03-06 DIAGNOSIS — I4510 Unspecified right bundle-branch block: Secondary | ICD-10-CM

## 2013-03-06 DIAGNOSIS — E785 Hyperlipidemia, unspecified: Secondary | ICD-10-CM

## 2013-03-06 DIAGNOSIS — R011 Cardiac murmur, unspecified: Secondary | ICD-10-CM

## 2013-03-06 MED ORDER — OLMESARTAN MEDOXOMIL 20 MG PO TABS: 20.0000 mg | ORAL_TABLET | Freq: Every day | ORAL | Status: DC

## 2013-03-06 MED ORDER — HYDROCHLOROTHIAZIDE 12.5 MG PO CAPS: 12.5000 mg | ORAL_CAPSULE | ORAL | Status: DC

## 2013-03-06 MED ORDER — SIMVASTATIN 10 MG PO TABS: 10.0000 mg | ORAL_TABLET | Freq: Every day | ORAL | Status: DC

## 2013-03-06 MED ORDER — CLORAZEPATE DIPOTASSIUM 7.5 MG PO TABS: 15.0000 mg | ORAL_TABLET | Freq: Three times a day (TID) | ORAL | Status: DC | PRN

## 2013-03-06 NOTE — Assessment & Plan Note (Signed)
Continue with current prescription therapy as reflected on the Med list.  

## 2013-03-06 NOTE — Progress Notes (Signed)
   Subjective:     Knee Pain  Pertinent negatives include no numbness.  Back Pain Pertinent negatives include no abdominal pain, headaches, numbness or weakness.     The patient is here to follow up on chronic LBP,  depression, anxiety, headaches and chronic moderate fibromyalgia symptoms controlled with medicines, diet and exercise. Her dizzy spell has resolved  Wt Readings from Last 3 Encounters:  03/06/13 163 lb (73.936 kg)  09/06/12 169 lb (76.658 kg)  06/07/12 177 lb (80.287 kg)   BP Readings from Last 3 Encounters:  03/06/13 110/80  09/18/12 126/71  09/06/12 132/84       Review of Systems  Constitutional: Negative for chills, activity change, appetite change, fatigue and unexpected weight change.  HENT: Negative for congestion, mouth sores and sinus pressure.   Eyes: Negative for visual disturbance.  Respiratory: Negative for cough and chest tightness.   Gastrointestinal: Negative for nausea and abdominal pain.  Genitourinary: Negative for frequency, difficulty urinating and vaginal pain.  Musculoskeletal: Positive for back pain. Negative for gait problem.  Skin: Negative for pallor and rash.  Neurological: Negative for dizziness, tremors, weakness, numbness and headaches.  Psychiatric/Behavioral: Negative for confusion and sleep disturbance.       Objective:   Physical Exam  Constitutional: She appears well-developed and well-nourished. No distress.  HENT:  Head: Normocephalic.  Right Ear: External ear normal.  Left Ear: External ear normal.  Nose: Nose normal.  Mouth/Throat: Oropharynx is clear and moist.  Eyes: Conjunctivae are normal. Pupils are equal, round, and reactive to light. Right eye exhibits no discharge. Left eye exhibits no discharge.  Neck: Normal range of motion. Neck supple. No JVD present. No tracheal deviation present. No thyromegaly present.  Cardiovascular: Normal rate and regular rhythm.   Murmur (1-2/6) heard. Pulmonary/Chest: No  stridor. No respiratory distress. She has no wheezes.  Abdominal: Soft. Bowel sounds are normal. She exhibits no distension and no mass. There is no tenderness. There is no rebound and no guarding.  Musculoskeletal: She exhibits tenderness (LS is tender w/ROM). She exhibits no edema.  Lymphadenopathy:    She has no cervical adenopathy.  Neurological: She displays normal reflexes. No cranial nerve deficit. She exhibits normal muscle tone. Coordination normal.  Skin: No rash noted. No erythema.  Psychiatric: She has a normal mood and affect. Her behavior is normal. Judgment and thought content normal.    Lab Results  Component Value Date   WBC 5.8 09/06/2012   HGB 12.8 09/06/2012   HCT 38.0 09/06/2012   PLT 184.0 09/06/2012   GLUCOSE 100* 09/06/2012   CHOL 146 09/06/2012   TRIG 106.0 09/06/2012   HDL 63.90 09/06/2012   LDLDIRECT 64.8 09/23/2008   LDLCALC 61 09/06/2012   ALT 19 09/06/2012   AST 23 09/06/2012   NA 141 09/06/2012   K 5.1 09/06/2012   CL 106 09/06/2012   CREATININE 1.0 09/06/2012   BUN 12 09/06/2012   CO2 28 09/06/2012   TSH 2.95 09/06/2012   INR 1.0 01/17/2008         Assessment & Plan:

## 2013-03-06 NOTE — Assessment & Plan Note (Signed)
No sx's 

## 2013-03-06 NOTE — Assessment & Plan Note (Signed)
ECHO

## 2013-03-15 DIAGNOSIS — H353290 Exudative age-related macular degeneration, unspecified eye, stage unspecified: Secondary | ICD-10-CM | POA: Diagnosis not present

## 2013-03-16 ENCOUNTER — Ambulatory Visit (HOSPITAL_COMMUNITY): Payer: Medicare Other | Attending: Internal Medicine

## 2013-03-16 DIAGNOSIS — I10 Essential (primary) hypertension: Secondary | ICD-10-CM | POA: Diagnosis not present

## 2013-03-16 DIAGNOSIS — IMO0001 Reserved for inherently not codable concepts without codable children: Secondary | ICD-10-CM | POA: Insufficient documentation

## 2013-03-16 DIAGNOSIS — R011 Cardiac murmur, unspecified: Secondary | ICD-10-CM | POA: Diagnosis not present

## 2013-03-16 DIAGNOSIS — I4510 Unspecified right bundle-branch block: Secondary | ICD-10-CM | POA: Diagnosis not present

## 2013-03-16 NOTE — Progress Notes (Signed)
Echocardiogram performed.  

## 2013-03-23 ENCOUNTER — Ambulatory Visit (INDEPENDENT_AMBULATORY_CARE_PROVIDER_SITE_OTHER): Payer: Medicare Other | Admitting: Cardiology

## 2013-03-23 ENCOUNTER — Encounter: Payer: Self-pay | Admitting: Cardiology

## 2013-03-23 VITALS — BP 120/70 | HR 80 | Ht 69.0 in | Wt 163.8 lb

## 2013-03-23 DIAGNOSIS — I4510 Unspecified right bundle-branch block: Secondary | ICD-10-CM

## 2013-03-23 DIAGNOSIS — I491 Atrial premature depolarization: Secondary | ICD-10-CM

## 2013-03-23 DIAGNOSIS — R011 Cardiac murmur, unspecified: Secondary | ICD-10-CM

## 2013-03-23 NOTE — Patient Instructions (Signed)
Continue your current therapy  I will see you as needed. 

## 2013-03-23 NOTE — Progress Notes (Signed)
Pamela Callahan Date of Birth: [birth year] Medical Record #[id number]  History of Present Illness: Pamela Callahan is seen at the request of Dr. Gwendalyn Ege for cardiac evaluation. She is a pleasant 79 year old white female that I've seen in the remote past. She has a history of chronic right bundle branch block. She apparently has undergone evaluation recently for macular degeneration. Apparently some abnormality was noted of her cardiac status and cardiac followup was recommended. Her primary care physician recently noted a murmur and an echocardiogram was obtained. This showed normal LV function with only mild mitral and aortic insufficiency. Patient states she feels very well. She denies any chest pain or shortness of breath. She has no palpitations or dizziness.  Current Outpatient Prescriptions on File Prior to Visit  Medication Sig Dispense Refill  . aspirin 81 MG tablet Take 81 mg by mouth daily.        . benzonatate (TESSALON) 100 MG capsule TAKE 1 TO 2 CAPSULES TWICE A DAY AS NEEDED COUGH  120 capsule  0  . Cholecalciferol (VITAMIN D3) 1000 UNITS CAPS Take by mouth.        . clorazepate (TRANXENE) 7.5 MG tablet Take 2 tablets (15 mg total) by mouth 3 (three) times daily as needed for anxiety or sleep.  270 tablet  1  . EVISTA 60 MG tablet TAKE 1 TABLET BY MOUTH DAILY  90 tablet  2  . fexofenadine (ALLEGRA) 180 MG tablet Take 1 tablet (180 mg total) by mouth daily.  90 tablet  2  . fluticasone (FLONASE) 50 MCG/ACT nasal spray Place 1 spray into the nose daily. As needed  16 g  3  . hydrochlorothiazide (MICROZIDE) 12.5 MG capsule Take 1 capsule (12.5 mg total) by mouth every morning.  90 capsule  3  . Magnesium Gluconate 550 MG TABS Take by mouth daily.        . Multiple Vitamins-Minerals (PRESERVISION/LUTEIN) CAPS Take by mouth 2 (two) times daily.        Marland Kitchen olmesartan (BENICAR) 20 MG tablet Take 1 tablet (20 mg total) by mouth daily.  90 tablet  3  . prednisoLONE acetate (PRED FORTE) 1 %  ophthalmic suspension Place 1 drop into the left eye 4 (four) times daily.      . simvastatin (ZOCOR) 10 MG tablet TAKE 1 TABLET AT BEDTIME  90 tablet  2  . simvastatin (ZOCOR) 10 MG tablet Take 1 tablet (10 mg total) by mouth at bedtime.  90 tablet  3  . triamcinolone cream (KENALOG) 0.5 % APPLY TO AFFECTED AREA TWICE A DAY TO 4 TIMES A DAY  45 g  0   No current facility-administered medications on file prior to visit.    Allergies  Allergen Reactions  . Ace Inhibitors     REACTION: cough  . Penicillins     Past Medical History  Diagnosis Date  . Depression   . Hypertension   . Anxiety   . GERD (gastroesophageal reflux disease)   . Allergy   . Hyperlipidemia   . Osteopenia   . Low back pain   . PAC (premature atrial contraction)     Past Surgical History  Procedure Laterality Date  . Cholecystectomy  2006  . Total hip arthroplasty  2008    Right    History  Smoking status  . Never Smoker   Smokeless tobacco  . Never Used    History  Alcohol Use No    Family History  Problem Relation Age of  Onset  . Hypertension Other   . Hypertension Mother     Review of Systems: As noted in history of present illness.  All other systems were reviewed and are negative.  Physical Exam: BP 120/70  Pulse 80  Ht 5\' 9"  (1.753 m)  Wt 163 lb 12.8 oz (74.299 kg)  BMI 24.18 kg/m2 She is an elderly white female in no acute distress. HEENT: Normocephalic, atraumatic. Pupils equal round reactive. Sclera are clear. Oropharynx is clear. Neck is without JVD, adenopathy, thyromegaly, or bruits. Lungs: Clear Cardiovascular: Regular rate and rhythm. Normal S1 and S2. There is a grade 1-2/6 systolic ejection murmur at the left sternal border. Abdomen: Soft and nontender. No masses or hepatosplenomegaly. No bruits. Extremities: No cyanosis or edema. Pulses are 2+ and symmetric. Skin: Warm and dry Neuro: Alert and oriented x3. No focal findings. LABORATORY DATA: ECG demonstrates  normal sinus rhythm with PACs. There is left axis deviation and a right bundle branch block which is chronic.  Assessment / Plan: 1. PACs. These are asymptomatic. No significant structural heart disease. Electrolytes are normal. I've reassured her concerning this finding and no further evaluation or treatment needed.  2. Chronic right bundle branch block. 3. Murmur-mild mitral insufficiency noted on echo.  Plan: I reassured patient concerning her cardiac status. She is to continue her current medications and I will see her as needed.

## 2013-04-19 DIAGNOSIS — H353290 Exudative age-related macular degeneration, unspecified eye, stage unspecified: Secondary | ICD-10-CM | POA: Diagnosis not present

## 2013-04-24 ENCOUNTER — Ambulatory Visit (INDEPENDENT_AMBULATORY_CARE_PROVIDER_SITE_OTHER): Payer: Medicare Other | Admitting: *Deleted

## 2013-04-24 DIAGNOSIS — Z23 Encounter for immunization: Secondary | ICD-10-CM | POA: Diagnosis not present

## 2013-05-15 DIAGNOSIS — D485 Neoplasm of uncertain behavior of skin: Secondary | ICD-10-CM | POA: Diagnosis not present

## 2013-05-15 DIAGNOSIS — L723 Sebaceous cyst: Secondary | ICD-10-CM | POA: Diagnosis not present

## 2013-05-22 ENCOUNTER — Ambulatory Visit (INDEPENDENT_AMBULATORY_CARE_PROVIDER_SITE_OTHER): Payer: Medicare Other | Admitting: Internal Medicine

## 2013-05-22 ENCOUNTER — Encounter: Payer: Self-pay | Admitting: Internal Medicine

## 2013-05-22 VITALS — BP 122/82 | HR 79 | Temp 97.3°F | Ht 69.0 in | Wt 160.0 lb

## 2013-05-22 DIAGNOSIS — R1032 Left lower quadrant pain: Secondary | ICD-10-CM | POA: Diagnosis not present

## 2013-05-22 DIAGNOSIS — R197 Diarrhea, unspecified: Secondary | ICD-10-CM | POA: Diagnosis not present

## 2013-05-22 DIAGNOSIS — K5730 Diverticulosis of large intestine without perforation or abscess without bleeding: Secondary | ICD-10-CM | POA: Diagnosis not present

## 2013-05-22 NOTE — Patient Instructions (Signed)
Diverticulosis  Diverticulosis is a common condition that develops when small pouches (diverticula) form in the wall of the colon. The risk of diverticulosis increases with age. It happens more often in people who eat a low-fiber diet. Most individuals with diverticulosis have no symptoms. Those individuals with symptoms usually experience abdominal pain, constipation, or loose stools (diarrhea).  HOME CARE INSTRUCTIONS   · Increase the amount of fiber in your diet as directed by your caregiver or dietician. This may reduce symptoms of diverticulosis.  · Your caregiver may recommend taking a dietary fiber supplement.  · Drink at least 6 to 8 glasses of water each day to prevent constipation.  · Try not to strain when you have a bowel movement.  · Your caregiver may recommend avoiding nuts and seeds to prevent complications, although this is still an uncertain benefit.  · Only take over-the-counter or prescription medicines for pain, discomfort, or fever as directed by your caregiver.  FOODS WITH HIGH FIBER CONTENT INCLUDE:  · Fruits. Apple, peach, pear, tangerine, raisins, prunes.  · Vegetables. Brussels sprouts, asparagus, broccoli, cabbage, carrot, cauliflower, romaine lettuce, spinach, summer squash, tomato, winter squash, zucchini.  · Starchy Vegetables. Baked beans, kidney beans, lima beans, split peas, lentils, potatoes (with skin).  · Grains. Whole wheat bread, brown rice, bran flake cereal, plain oatmeal, white rice, shredded wheat, bran muffins.  SEEK IMMEDIATE MEDICAL CARE IF:   · You develop increasing pain or severe bloating.  · You have an oral temperature above 102° F (38.9° C), not controlled by medicine.  · You develop vomiting or bowel movements that are bloody or black.  Document Released: 04/15/2004 Document Revised: 10/11/2011 Document Reviewed: 12/17/2009  [employer]® Patient Information ©2014 [employer].

## 2013-05-22 NOTE — Progress Notes (Signed)
Subjective:    Patient ID: Pamela Callahan, female    DOB: [birth year], 79 y.o.   MRN: [id number]  HPI  Pt presents to the clinic today with c/o stomach pain. This started about 1 week ago. She did eat 2 salads and a hamburger. After that she developed abdominal pain and diarrhea. She does have a history of diverticulosis. She did speak with her pharmacist friend who advised her to take a probiotic and imdoium. She took imodium for 2 days and has been on the probiotic x 5 days. She is feeling much better and has not had any diarrhea/abdominal pain in 2 days. She denies seeing blood in her stool.  Review of Systems      Past Medical History  Diagnosis Date  . Depression   . Hypertension   . Anxiety   . GERD (gastroesophageal reflux disease)   . Allergy   . Hyperlipidemia   . Osteopenia   . Low back pain   . PAC (premature atrial contraction)     Current Outpatient Prescriptions  Medication Sig Dispense Refill  . aspirin 81 MG tablet Take 81 mg by mouth daily.        . benzonatate (TESSALON) 100 MG capsule TAKE 1 TO 2 CAPSULES TWICE A DAY AS NEEDED COUGH  120 capsule  0  . Cholecalciferol (VITAMIN D3) 1000 UNITS CAPS Take by mouth.        . clorazepate (TRANXENE) 7.5 MG tablet Take 2 tablets (15 mg total) by mouth 3 (three) times daily as needed for anxiety or sleep.  270 tablet  1  . EVISTA 60 MG tablet TAKE 1 TABLET BY MOUTH DAILY  90 tablet  2  . fexofenadine (ALLEGRA) 180 MG tablet Take 1 tablet (180 mg total) by mouth daily.  90 tablet  2  . fluticasone (FLONASE) 50 MCG/ACT nasal spray Place 1 spray into the nose daily. As needed  16 g  3  . hydrochlorothiazide (MICROZIDE) 12.5 MG capsule Take 1 capsule (12.5 mg total) by mouth every morning.  90 capsule  3  . Magnesium Gluconate 550 MG TABS Take by mouth daily.        . Multiple Vitamins-Minerals (PRESERVISION/LUTEIN) CAPS Take by mouth 2 (two) times daily.        Marland Kitchen olmesartan (BENICAR) 20 MG tablet Take 1 tablet (20 mg total)  by mouth daily.  90 tablet  3  . prednisoLONE acetate (PRED FORTE) 1 % ophthalmic suspension Place 1 drop into the left eye 4 (four) times daily.      . simvastatin (ZOCOR) 10 MG tablet TAKE 1 TABLET AT BEDTIME  90 tablet  2  . simvastatin (ZOCOR) 10 MG tablet Take 1 tablet (10 mg total) by mouth at bedtime.  90 tablet  3  . triamcinolone cream (KENALOG) 0.5 % APPLY TO AFFECTED AREA TWICE A DAY TO 4 TIMES A DAY  45 g  0   No current facility-administered medications for this visit.    Allergies  Allergen Reactions  . Ace Inhibitors     REACTION: cough  . Penicillins     Family History  Problem Relation Age of Onset  . Hypertension Other   . Hypertension Mother     History   Social History  . Marital Status: Married    Spouse Name: N/A    Number of Children: N/A  . Years of Education: N/A   Occupational History  . Retired PhD   . 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 Smoking status: Never Smoker   . Smokeless tobacco: Never Used  . Alcohol Use: No  . Drug Use: Yes  . Sexual Activity: Not on file   Other Topics Concern  . Not on file   Social History Narrative  . No narrative on file     Constitutional: Denies fever, malaise, fatigue, headache or abrupt weight changes.  Respiratory: Denies difficulty breathing, shortness of breath, cough or sputum production.   Cardiovascular: Denies chest pain, chest tightness, palpitations or swelling in the hands or feet.  Gastrointestinal: Pt reports abdominal pain and diarrhea. Denies bloating, constipation,  or blood in the stool.    No other specific complaints in a complete review of systems (except as listed in HPI above).  Objective:   Physical Exam   BP 122/82  Pulse 79  Temp(Src) 97.3 F (36.3 C) (Oral)  Ht 5\' 9"  (1.753 m)  Wt 160 lb (72.576 kg)  BMI 23.62 kg/m2  SpO2 97% Wt Readings from Last 3 Encounters:  05/22/13 160 lb (72.576 kg)  03/23/13 163 lb 12.8 oz (74.299 kg)  03/06/13  163 lb (73.936 kg)    General: Appears her stated age, well developed, well nourished in NAD. Cardiovascular: Normal rate and rhythm. S1,S2 noted.  No murmur, rubs or gallops noted. No JVD or BLE edema. No carotid bruits noted. Pulmonary/Chest: Normal effort and positive vesicular breath sounds. No respiratory distress. No wheezes, rales or ronchi noted.  Abdomen: Soft and nontender. Normal bowel sounds, no bruits noted. No distention or masses noted. Liver, spleen and kidneys non palpable.   BMET    Component Value Date/Time   NA 141 09/06/2012 1059   K 5.1 09/06/2012 1059   CL 106 09/06/2012 1059   CO2 28 09/06/2012 1059   GLUCOSE 100* 09/06/2012 1059   GLUCOSE 109* 05/24/2006 0816   BUN 12 09/06/2012 1059   CREATININE 1.0 09/06/2012 1059   CALCIUM 9.5 09/06/2012 1059   GFRNONAA 64.17 03/17/2010 0907   GFRAA 78 09/23/2008 1416    Lipid Panel     Component Value Date/Time   CHOL 146 09/06/2012 1059   TRIG 106.0 09/06/2012 1059   HDL 63.90 09/06/2012 1059   CHOLHDL 2 09/06/2012 1059   VLDL 21.2 09/06/2012 1059   LDLCALC 61 09/06/2012 1059    CBC    Component Value Date/Time   WBC 5.8 09/06/2012 1059   RBC 4.18 09/06/2012 1059   HGB 12.8 09/06/2012 1059   HCT 38.0 09/06/2012 1059   PLT 184.0 09/06/2012 1059   MCV 91.1 09/06/2012 1059   MCHC 33.5 09/06/2012 1059   RDW 13.5 09/06/2012 1059   LYMPHSABS 1.6 09/06/2012 1059   MONOABS 0.5 09/06/2012 1059   EOSABS 0.1 09/06/2012 1059   BASOSABS 0.0 09/06/2012 1059    Hgb A1C No results found for this basename: HGBA1C        Assessment & Plan:   Diverticulosis of colon:  Ok to continue probiotic Drink plenty of fluids to avoid dehydration Watch for recurrent symptoms and call me if they return- you may need treatment with an antibiotic  RTC as needed or if symptoms return

## 2013-05-22 NOTE — Progress Notes (Signed)
HPI: Pt presents to the office with complaints of stomach pain that started about 10 days ago. Pt has extensive history of diverticulitis and is usually well controlled on her diet. About 10 days ago she ate two salads and later ate a hamburger. Pt started have multiple watery stools, about 10 stools a day. Pt states she had abdominal cramping, unrelieved by any OTC pain relievers. Pt states she called her pharmacist who recommended trying OTC Imodium capsules and OTC probiotic, which she started using 5 days ago. Pt states she is feeling much better. She denies blood in stool, nausea, vomiting, or pain at this time.   Past Medical History  Diagnosis Date  . Depression   . Hypertension   . Anxiety   . GERD (gastroesophageal reflux disease)   . Allergy   . Hyperlipidemia   . Osteopenia   . Low back pain   . PAC (premature atrial contraction)   . Diverticulitis     Current Outpatient Prescriptions  Medication Sig Dispense Refill  . aspirin 81 MG tablet Take 81 mg by mouth daily.        . benzonatate (TESSALON) 100 MG capsule TAKE 1 TO 2 CAPSULES TWICE A DAY AS NEEDED COUGH  120 capsule  0  . Cholecalciferol (VITAMIN D3) 1000 UNITS CAPS Take by mouth.        . clorazepate (TRANXENE) 7.5 MG tablet Take 2 tablets (15 mg total) by mouth 3 (three) times daily as needed for anxiety or sleep.  270 tablet  1  . EVISTA 60 MG tablet TAKE 1 TABLET BY MOUTH DAILY  90 tablet  2  . fexofenadine (ALLEGRA) 180 MG tablet Take 1 tablet (180 mg total) by mouth daily.  90 tablet  2  . fluticasone (FLONASE) 50 MCG/ACT nasal spray Place 1 spray into the nose daily. As needed  16 g  3  . hydrochlorothiazide (MICROZIDE) 12.5 MG capsule Take 1 capsule (12.5 mg total) by mouth every morning.  90 capsule  3  . Magnesium Gluconate 550 MG TABS Take by mouth daily.        . Multiple Vitamins-Minerals (PRESERVISION/LUTEIN) CAPS Take by mouth 2 (two) times daily.        Marland Kitchen olmesartan (BENICAR) 20 MG tablet Take 1 tablet  (20 mg total) by mouth daily.  90 tablet  3  . prednisoLONE acetate (PRED FORTE) 1 % ophthalmic suspension Place 1 drop into the left eye 4 (four) times daily.      . simvastatin (ZOCOR) 10 MG tablet TAKE 1 TABLET AT BEDTIME  90 tablet  2  . simvastatin (ZOCOR) 10 MG tablet Take 1 tablet (10 mg total) by mouth at bedtime.  90 tablet  3  . triamcinolone cream (KENALOG) 0.5 % APPLY TO AFFECTED AREA TWICE A DAY TO 4 TIMES A DAY  45 g  0   No current facility-administered medications for this visit.    Allergies  Allergen Reactions  . Ace Inhibitors     REACTION: cough  . Penicillins     ROS:  Constitutional: Denies fever, malaise, fatigue, headache or abrupt weight changes.    Cardiovascular: Denies chest pain, chest tightness, palpitations or swelling in the hands or feet.  Gastrointestinal: Endorsed abdominal pain and diarrhea. Denied bloating, constipation, nausea, vomiting or blood in the stool.  Neurological: Denies dizziness, difficulty with memory, difficulty with speech or problems with balance and coordination.   No other specific complaints in a complete review of systems (except as  listed in HPI above).  PE:  BP 122/82  Pulse 79  Temp(Src) 97.3 F (36.3 C) (Oral)  Ht 5\' 9"  (1.753 m)  Wt 160 lb (72.576 kg)  BMI 23.62 kg/m2  SpO2 97% Wt Readings from Last 3 Encounters:  05/22/13 160 lb (72.576 kg)  03/23/13 163 lb 12.8 oz (74.299 kg)  03/06/13 163 lb (73.936 kg)    General: Appears their stated age, well developed, well nourished in NAD. Cardiovascular: Normal rate and rhythm. S1,S2 noted.  No murmur, rubs or gallops noted. No JVD or BLE edema. No carotid bruits noted. Abdomen: Soft and nontender. Normal bowel sounds, no bruits noted. No distention or masses noted. Liver, spleen and kidneys non palpable. Psychiatric: Mood and affect normal. Behavior is normal. Judgment and thought content normal.     Assessment and Plan: Diverticulosis Discontinue taking the  Imodium Continue Probiotic therapy, one capsule daily If symptoms return or worsen please call us and we will provide possible antibiotic therapy  Del Wiseman S, Student-NP

## 2013-05-24 ENCOUNTER — Telehealth: Payer: Self-pay | Admitting: Internal Medicine

## 2013-05-24 NOTE — Telephone Encounter (Signed)
Spoke with pt reviewed discharge instructions with her.

## 2013-05-24 NOTE — Telephone Encounter (Signed)
Pt request phone call from the assistant, pt was in the office to see [facility] 05/22/13 for diverticulosis, pt was wondering when can she start eating and what kind of food is ok for her to eat? Please advise.

## 2013-05-31 DIAGNOSIS — Z961 Presence of intraocular lens: Secondary | ICD-10-CM | POA: Diagnosis not present

## 2013-05-31 DIAGNOSIS — H353190 Nonexudative age-related macular degeneration, unspecified eye, stage unspecified: Secondary | ICD-10-CM | POA: Diagnosis not present

## 2013-05-31 DIAGNOSIS — Z9889 Other specified postprocedural states: Secondary | ICD-10-CM | POA: Diagnosis not present

## 2013-05-31 DIAGNOSIS — H353290 Exudative age-related macular degeneration, unspecified eye, stage unspecified: Secondary | ICD-10-CM | POA: Diagnosis not present

## 2013-07-19 DIAGNOSIS — H353290 Exudative age-related macular degeneration, unspecified eye, stage unspecified: Secondary | ICD-10-CM | POA: Diagnosis not present

## 2013-08-30 DIAGNOSIS — Z9889 Other specified postprocedural states: Secondary | ICD-10-CM | POA: Diagnosis not present

## 2013-08-30 DIAGNOSIS — H353190 Nonexudative age-related macular degeneration, unspecified eye, stage unspecified: Secondary | ICD-10-CM | POA: Diagnosis not present

## 2013-08-30 DIAGNOSIS — H353290 Exudative age-related macular degeneration, unspecified eye, stage unspecified: Secondary | ICD-10-CM | POA: Diagnosis not present

## 2013-08-30 DIAGNOSIS — Z961 Presence of intraocular lens: Secondary | ICD-10-CM | POA: Diagnosis not present

## 2013-09-11 ENCOUNTER — Other Ambulatory Visit (INDEPENDENT_AMBULATORY_CARE_PROVIDER_SITE_OTHER): Payer: Medicare Other

## 2013-09-11 ENCOUNTER — Ambulatory Visit (INDEPENDENT_AMBULATORY_CARE_PROVIDER_SITE_OTHER): Payer: Medicare Other | Admitting: Internal Medicine

## 2013-09-11 ENCOUNTER — Encounter: Payer: Self-pay | Admitting: Internal Medicine

## 2013-09-11 VITALS — BP 130/82 | HR 76 | Temp 96.8°F | Resp 16 | Ht 69.0 in | Wt 150.0 lb

## 2013-09-11 DIAGNOSIS — F411 Generalized anxiety disorder: Secondary | ICD-10-CM

## 2013-09-11 DIAGNOSIS — F3289 Other specified depressive episodes: Secondary | ICD-10-CM

## 2013-09-11 DIAGNOSIS — F329 Major depressive disorder, single episode, unspecified: Secondary | ICD-10-CM

## 2013-09-11 DIAGNOSIS — M545 Low back pain, unspecified: Secondary | ICD-10-CM

## 2013-09-11 DIAGNOSIS — I10 Essential (primary) hypertension: Secondary | ICD-10-CM | POA: Diagnosis not present

## 2013-09-11 DIAGNOSIS — Z0000 Encounter for general adult medical examination without abnormal findings: Secondary | ICD-10-CM

## 2013-09-11 DIAGNOSIS — E785 Hyperlipidemia, unspecified: Secondary | ICD-10-CM | POA: Diagnosis not present

## 2013-09-11 DIAGNOSIS — J309 Allergic rhinitis, unspecified: Secondary | ICD-10-CM

## 2013-09-11 DIAGNOSIS — M1990 Unspecified osteoarthritis, unspecified site: Secondary | ICD-10-CM

## 2013-09-11 LAB — CBC WITH DIFFERENTIAL/PLATELET
BASOS PCT: 0.7 % (ref 0.0–3.0)
Basophils Absolute: 0 10*3/uL (ref 0.0–0.1)
EOS ABS: 0.1 10*3/uL (ref 0.0–0.7)
EOS PCT: 1.5 % (ref 0.0–5.0)
HEMATOCRIT: 39.1 % (ref 36.0–46.0)
HEMOGLOBIN: 13.1 g/dL (ref 12.0–15.0)
LYMPHS ABS: 1.3 10*3/uL (ref 0.7–4.0)
Lymphocytes Relative: 23.9 % (ref 12.0–46.0)
MCHC: 33.6 g/dL (ref 30.0–36.0)
MCV: 93 fl (ref 78.0–100.0)
MONO ABS: 0.5 10*3/uL (ref 0.1–1.0)
Monocytes Relative: 8.1 % (ref 3.0–12.0)
NEUTROS ABS: 3.7 10*3/uL (ref 1.4–7.7)
NEUTROS PCT: 65.8 % (ref 43.0–77.0)
Platelets: 244 10*3/uL (ref 150.0–400.0)
RBC: 4.2 Mil/uL (ref 3.87–5.11)
RDW: 13.9 % (ref 11.5–14.6)
WBC: 5.6 10*3/uL (ref 4.5–10.5)

## 2013-09-11 LAB — URINALYSIS
BILIRUBIN URINE: NEGATIVE
Hgb urine dipstick: NEGATIVE
KETONES UR: NEGATIVE
LEUKOCYTES UA: NEGATIVE
Nitrite: NEGATIVE
SPECIFIC GRAVITY, URINE: 1.01 (ref 1.000–1.030)
Total Protein, Urine: NEGATIVE
URINE GLUCOSE: NEGATIVE
UROBILINOGEN UA: 0.2 (ref 0.0–1.0)
pH: 7 (ref 5.0–8.0)

## 2013-09-11 LAB — LIPID PANEL
CHOL/HDL RATIO: 2
Cholesterol: 174 mg/dL (ref 0–200)
HDL: 82.2 mg/dL (ref >39.00)
LDL Cholesterol: 76 mg/dL (ref 0–99)
Triglycerides: 80 mg/dL (ref 0.0–149.0)
VLDL: 16 mg/dL (ref 0.0–40.0)

## 2013-09-11 LAB — HEPATIC FUNCTION PANEL
ALT: 13 U/L (ref 0–35)
AST: 20 U/L (ref 0–37)
Albumin: 4 g/dL (ref 3.5–5.2)
Alkaline Phosphatase: 70 U/L (ref 39–117)
BILIRUBIN DIRECT: 0.2 mg/dL (ref 0.0–0.3)
BILIRUBIN TOTAL: 1.4 mg/dL — AB (ref 0.3–1.2)
Total Protein: 7.1 g/dL (ref 6.0–8.3)

## 2013-09-11 LAB — BASIC METABOLIC PANEL
BUN: 9 mg/dL (ref 6–23)
CO2: 29 mEq/L (ref 19–32)
CREATININE: 0.8 mg/dL (ref 0.4–1.2)
Calcium: 9.4 mg/dL (ref 8.4–10.5)
Chloride: 103 mEq/L (ref 96–112)
GFR: 75.03 mL/min (ref >60.00)
GLUCOSE: 93 mg/dL (ref 70–99)
POTASSIUM: 3.6 meq/L (ref 3.5–5.1)
Sodium: 139 mEq/L (ref 135–145)

## 2013-09-11 LAB — TSH: TSH: 1.8 u[IU]/mL (ref 0.35–5.50)

## 2013-09-11 MED ORDER — FLUTICASONE PROPIONATE 50 MCG/ACT NA SUSP: 1.0000 | Freq: Every day | NASAL | Status: DC

## 2013-09-11 MED ORDER — CLORAZEPATE DIPOTASSIUM 7.5 MG PO TABS: 15.0000 mg | ORAL_TABLET | Freq: Three times a day (TID) | ORAL | Status: DC | PRN

## 2013-09-11 MED ORDER — HYDROCHLOROTHIAZIDE 12.5 MG PO CAPS: 12.5000 mg | ORAL_CAPSULE | ORAL | Status: DC

## 2013-09-11 MED ORDER — TRIAMCINOLONE ACETONIDE 0.5 % EX CREA: TOPICAL_CREAM | Freq: Two times a day (BID) | CUTANEOUS | Status: DC

## 2013-09-11 MED ORDER — SIMVASTATIN 10 MG PO TABS: 10.0000 mg | ORAL_TABLET | Freq: Every day | ORAL | Status: DC

## 2013-09-11 NOTE — Assessment & Plan Note (Signed)
Continue with current prescription therapy as reflected on the Med list.  

## 2013-09-11 NOTE — Progress Notes (Signed)
   Subjective:     Knee Pain  Pertinent negatives include no numbness.  Back Pain Pertinent negatives include no abdominal pain, headaches, numbness or weakness.     The patient is here to follow up on chronic LBP,  depression, anxiety, headaches and chronic moderate fibromyalgia symptoms controlled with medicines, diet and exercise. Her dizzy spell has resolved  Wt Readings from Last 3 Encounters:  09/11/13 150 lb (68.04 kg)  05/22/13 160 lb (72.576 kg)  03/23/13 163 lb 12.8 oz (74.299 kg)   BP Readings from Last 3 Encounters:  09/11/13 130/82  05/22/13 122/82  03/23/13 120/70       Review of Systems  Constitutional: Negative for chills, activity change, appetite change, fatigue and unexpected weight change.  HENT: Negative for congestion, mouth sores and sinus pressure.   Eyes: Negative for visual disturbance.  Respiratory: Negative for cough and chest tightness.   Gastrointestinal: Negative for nausea and abdominal pain.  Genitourinary: Negative for frequency, difficulty urinating and vaginal pain.  Musculoskeletal: Positive for back pain. Negative for gait problem.  Skin: Negative for pallor and rash.  Neurological: Negative for dizziness, tremors, weakness, numbness and headaches.  Psychiatric/Behavioral: Negative for confusion and sleep disturbance.       Objective:   Physical Exam  Constitutional: She appears well-developed and well-nourished. No distress.  HENT:  Head: Normocephalic.  Right Ear: External ear normal.  Left Ear: External ear normal.  Nose: Nose normal.  Mouth/Throat: Oropharynx is clear and moist.  Eyes: Conjunctivae are normal. Pupils are equal, round, and reactive to light. Right eye exhibits no discharge. Left eye exhibits no discharge.  Neck: Normal range of motion. Neck supple. No JVD present. No tracheal deviation present. No thyromegaly present.  Cardiovascular: Normal rate and regular rhythm.   Murmur (1-2/6) heard. Pulmonary/Chest:  No stridor. No respiratory distress. She has no wheezes.  Abdominal: Soft. Bowel sounds are normal. She exhibits no distension and no mass. There is no tenderness. There is no rebound and no guarding.  Musculoskeletal: She exhibits tenderness (LS is tender w/ROM). She exhibits no edema.  Lymphadenopathy:    She has no cervical adenopathy.  Neurological: She displays normal reflexes. No cranial nerve deficit. She exhibits normal muscle tone. Coordination normal.  Skin: No rash noted. No erythema.  Psychiatric: She has a normal mood and affect. Her behavior is normal. Judgment and thought content normal.    Lab Results  Component Value Date   WBC 5.8 09/06/2012   HGB 12.8 09/06/2012   HCT 38.0 09/06/2012   PLT 184.0 09/06/2012   GLUCOSE 100* 09/06/2012   CHOL 146 09/06/2012   TRIG 106.0 09/06/2012   HDL 63.90 09/06/2012   LDLDIRECT 64.8 09/23/2008   LDLCALC 61 09/06/2012   ALT 19 09/06/2012   AST 23 09/06/2012   NA 141 09/06/2012   K 5.1 09/06/2012   CL 106 09/06/2012   CREATININE 1.0 09/06/2012   BUN 12 09/06/2012   CO2 28 09/06/2012   TSH 2.95 09/06/2012   INR 1.0 01/17/2008         Assessment & Plan:

## 2013-09-11 NOTE — Assessment & Plan Note (Signed)
Continue with current prn therapy as reflected on the Med list.

## 2013-09-11 NOTE — Progress Notes (Signed)
Pre visit review using our clinic review tool, if applicable. No additional management support is needed unless otherwise documented below in the visit note. 

## 2013-09-18 ENCOUNTER — Encounter: Payer: Self-pay | Admitting: Internal Medicine

## 2013-10-10 DIAGNOSIS — H903 Sensorineural hearing loss, bilateral: Secondary | ICD-10-CM | POA: Diagnosis not present

## 2013-10-11 DIAGNOSIS — H353290 Exudative age-related macular degeneration, unspecified eye, stage unspecified: Secondary | ICD-10-CM | POA: Diagnosis not present

## 2013-11-22 DIAGNOSIS — H353290 Exudative age-related macular degeneration, unspecified eye, stage unspecified: Secondary | ICD-10-CM | POA: Diagnosis not present

## 2014-01-03 DIAGNOSIS — H353290 Exudative age-related macular degeneration, unspecified eye, stage unspecified: Secondary | ICD-10-CM | POA: Diagnosis not present

## 2014-01-28 DIAGNOSIS — M25819 Other specified joint disorders, unspecified shoulder: Secondary | ICD-10-CM | POA: Diagnosis not present

## 2014-01-28 DIAGNOSIS — M25519 Pain in unspecified shoulder: Secondary | ICD-10-CM | POA: Diagnosis not present

## 2014-01-28 DIAGNOSIS — M129 Arthropathy, unspecified: Secondary | ICD-10-CM | POA: Diagnosis not present

## 2014-01-30 DIAGNOSIS — M25519 Pain in unspecified shoulder: Secondary | ICD-10-CM | POA: Diagnosis not present

## 2014-02-06 DIAGNOSIS — M25519 Pain in unspecified shoulder: Secondary | ICD-10-CM | POA: Diagnosis not present

## 2014-02-08 DIAGNOSIS — M25519 Pain in unspecified shoulder: Secondary | ICD-10-CM | POA: Diagnosis not present

## 2014-02-13 DIAGNOSIS — M25519 Pain in unspecified shoulder: Secondary | ICD-10-CM | POA: Diagnosis not present

## 2014-02-20 DIAGNOSIS — M25519 Pain in unspecified shoulder: Secondary | ICD-10-CM | POA: Diagnosis not present

## 2014-02-21 DIAGNOSIS — Z961 Presence of intraocular lens: Secondary | ICD-10-CM | POA: Diagnosis not present

## 2014-02-21 DIAGNOSIS — H353190 Nonexudative age-related macular degeneration, unspecified eye, stage unspecified: Secondary | ICD-10-CM | POA: Diagnosis not present

## 2014-02-21 DIAGNOSIS — Z9889 Other specified postprocedural states: Secondary | ICD-10-CM | POA: Diagnosis not present

## 2014-02-21 DIAGNOSIS — H353290 Exudative age-related macular degeneration, unspecified eye, stage unspecified: Secondary | ICD-10-CM | POA: Diagnosis not present

## 2014-02-22 DIAGNOSIS — M25519 Pain in unspecified shoulder: Secondary | ICD-10-CM | POA: Diagnosis not present

## 2014-02-26 DIAGNOSIS — M25519 Pain in unspecified shoulder: Secondary | ICD-10-CM | POA: Diagnosis not present

## 2014-02-26 DIAGNOSIS — M25819 Other specified joint disorders, unspecified shoulder: Secondary | ICD-10-CM | POA: Diagnosis not present

## 2014-03-12 ENCOUNTER — Other Ambulatory Visit (INDEPENDENT_AMBULATORY_CARE_PROVIDER_SITE_OTHER): Payer: Medicare Other

## 2014-03-12 ENCOUNTER — Ambulatory Visit (INDEPENDENT_AMBULATORY_CARE_PROVIDER_SITE_OTHER): Payer: Medicare Other | Admitting: Internal Medicine

## 2014-03-12 ENCOUNTER — Encounter: Payer: Self-pay | Admitting: Internal Medicine

## 2014-03-12 VITALS — BP 144/85 | HR 67 | Temp 97.5°F | Wt 153.0 lb

## 2014-03-12 DIAGNOSIS — M545 Low back pain, unspecified: Secondary | ICD-10-CM | POA: Diagnosis not present

## 2014-03-12 DIAGNOSIS — E785 Hyperlipidemia, unspecified: Secondary | ICD-10-CM

## 2014-03-12 DIAGNOSIS — R21 Rash and other nonspecific skin eruption: Secondary | ICD-10-CM | POA: Insufficient documentation

## 2014-03-12 DIAGNOSIS — F3289 Other specified depressive episodes: Secondary | ICD-10-CM

## 2014-03-12 DIAGNOSIS — F329 Major depressive disorder, single episode, unspecified: Secondary | ICD-10-CM

## 2014-03-12 DIAGNOSIS — I10 Essential (primary) hypertension: Secondary | ICD-10-CM

## 2014-03-12 DIAGNOSIS — F411 Generalized anxiety disorder: Secondary | ICD-10-CM

## 2014-03-12 LAB — CBC WITH DIFFERENTIAL/PLATELET
BASOS PCT: 0.7 % (ref 0.0–3.0)
Basophils Absolute: 0 10*3/uL (ref 0.0–0.1)
EOS ABS: 0.1 10*3/uL (ref 0.0–0.7)
EOS PCT: 2.3 % (ref 0.0–5.0)
HEMATOCRIT: 39.8 % (ref 36.0–46.0)
Hemoglobin: 13.5 g/dL (ref 12.0–15.0)
LYMPHS ABS: 1.2 10*3/uL (ref 0.7–4.0)
Lymphocytes Relative: 21.8 % (ref 12.0–46.0)
MCHC: 34 g/dL (ref 30.0–36.0)
MCV: 91.8 fl (ref 78.0–100.0)
MONO ABS: 0.4 10*3/uL (ref 0.1–1.0)
Monocytes Relative: 7 % (ref 3.0–12.0)
Neutro Abs: 3.8 10*3/uL (ref 1.4–7.7)
Neutrophils Relative %: 68.2 % (ref 43.0–77.0)
PLATELETS: 249 10*3/uL (ref 150.0–400.0)
RBC: 4.33 Mil/uL (ref 3.87–5.11)
RDW: 13.6 % (ref 11.5–15.5)
WBC: 5.5 10*3/uL (ref 4.0–10.5)

## 2014-03-12 LAB — URINALYSIS
Bilirubin Urine: NEGATIVE
Hgb urine dipstick: NEGATIVE
Ketones, ur: NEGATIVE
LEUKOCYTES UA: NEGATIVE
Nitrite: NEGATIVE
PH: 7 (ref 5.0–8.0)
TOTAL PROTEIN, URINE-UPE24: NEGATIVE
Urine Glucose: NEGATIVE
Urobilinogen, UA: 0.2 (ref 0.0–1.0)

## 2014-03-12 LAB — HEPATIC FUNCTION PANEL
ALK PHOS: 66 U/L (ref 39–117)
ALT: 13 U/L (ref 0–35)
AST: 18 U/L (ref 0–37)
Albumin: 4.2 g/dL (ref 3.5–5.2)
BILIRUBIN TOTAL: 1.2 mg/dL (ref 0.2–1.2)
Bilirubin, Direct: 0.2 mg/dL (ref 0.0–0.3)
Total Protein: 7.3 g/dL (ref 6.0–8.3)

## 2014-03-12 LAB — BASIC METABOLIC PANEL
BUN: 10 mg/dL (ref 6–23)
CHLORIDE: 100 meq/L (ref 96–112)
CO2: 25 meq/L (ref 19–32)
CREATININE: 0.8 mg/dL (ref 0.4–1.2)
Calcium: 9.5 mg/dL (ref 8.4–10.5)
GFR: 76.07 mL/min (ref >60.00)
Glucose, Bld: 85 mg/dL (ref 70–99)
POTASSIUM: 3.9 meq/L (ref 3.5–5.1)
Sodium: 138 mEq/L (ref 135–145)

## 2014-03-12 LAB — TSH: TSH: 2.14 u[IU]/mL (ref 0.35–4.50)

## 2014-03-12 LAB — SEDIMENTATION RATE: SED RATE: 18 mm/h (ref 0–22)

## 2014-03-12 MED ORDER — TRIAMCINOLONE ACETONIDE 0.5 % EX CREA: TOPICAL_CREAM | Freq: Two times a day (BID) | CUTANEOUS | Status: DC

## 2014-03-12 MED ORDER — SIMVASTATIN 10 MG PO TABS: 10.0000 mg | ORAL_TABLET | Freq: Every day | ORAL | Status: DC

## 2014-03-12 MED ORDER — OLMESARTAN MEDOXOMIL 20 MG PO TABS: 20.0000 mg | ORAL_TABLET | Freq: Every day | ORAL | Status: DC

## 2014-03-12 MED ORDER — CLORAZEPATE DIPOTASSIUM 7.5 MG PO TABS: 15.0000 mg | ORAL_TABLET | Freq: Three times a day (TID) | ORAL | Status: DC | PRN

## 2014-03-12 MED ORDER — PAROXETINE HCL 10 MG PO TABS: 10.0000 mg | ORAL_TABLET | Freq: Every day | ORAL | Status: DC

## 2014-03-12 MED ORDER — HYDROCHLOROTHIAZIDE 12.5 MG PO CAPS: 12.5000 mg | ORAL_CAPSULE | ORAL | Status: DC

## 2014-03-12 NOTE — Assessment & Plan Note (Signed)
Continue with current prescription therapy as reflected on the Med list.  

## 2014-03-12 NOTE — Progress Notes (Signed)
Subjective:   C/o rash all over the body, itching C/o stress   Back Pain This is a chronic problem. The current episode started more than 1 year ago (long time). The problem occurs constantly. The pain is present in the lumbar spine. The pain is at a severity of 6/10. The pain is moderate. The symptoms are aggravated by bending, sitting and standing. Stiffness is present in the morning. Pertinent negatives include no abdominal pain, dysuria, headaches, paresthesias or weakness. She has tried analgesics for the symptoms. The treatment provided mild relief.   C/o stress w/sick husband: Ron is at the [facility] in [place]. Ron is demanding a lot of Pamela Callahan's attention...  The patient is here to follow up on chronic LBP,  depression, anxiety, headaches and chronic moderate fibromyalgia symptoms controlled with medicines, diet and exercise. Her dizzy spell has resolved  Wt Readings from Last 3 Encounters:  03/12/14 153 lb (69.4 kg)  09/11/13 150 lb (68.04 kg)  05/22/13 160 lb (72.576 kg)   BP Readings from Last 3 Encounters:  03/12/14 144/85  09/11/13 130/82  05/22/13 122/82       Review of Systems  Constitutional: Negative for chills, activity change, appetite change, fatigue and unexpected weight change.  HENT: Negative for congestion, mouth sores and sinus pressure.   Eyes: Negative for visual disturbance.  Respiratory: Negative for cough and chest tightness.   Gastrointestinal: Negative for nausea and abdominal pain.  Genitourinary: Negative for dysuria, frequency, difficulty urinating and vaginal pain.  Musculoskeletal: Positive for back pain. Negative for gait problem.  Skin: Negative for pallor and rash.  Neurological: Negative for dizziness, tremors, weakness, headaches and paresthesias.  Psychiatric/Behavioral: Negative for confusion and sleep disturbance.       Objective:   Physical Exam  Constitutional: She appears well-developed and well-nourished. No distress.  HENT:   Head: Normocephalic.  Right Ear: External ear normal.  Left Ear: External ear normal.  Nose: Nose normal.  Mouth/Throat: Oropharynx is clear and moist.  Eyes: Conjunctivae are normal. Pupils are equal, round, and reactive to light. Right eye exhibits no discharge. Left eye exhibits no discharge.  Neck: Normal range of motion. Neck supple. No JVD present. No tracheal deviation present. No thyromegaly present.  Cardiovascular: Normal rate and regular rhythm.   Murmur (1-2/6) heard. Pulmonary/Chest: No stridor. No respiratory distress. She has no wheezes.  Abdominal: Soft. Bowel sounds are normal. She exhibits no distension and no mass. There is no tenderness. There is no rebound and no guarding.  Musculoskeletal: She exhibits tenderness (LS is tender w/ROM). She exhibits no edema.  Lymphadenopathy:    She has no cervical adenopathy.  Neurological: She displays normal reflexes. No cranial nerve deficit. She exhibits normal muscle tone. Coordination normal.  Skin: No rash noted. No erythema.  Psychiatric: She has a normal mood and affect. Her behavior is normal. Judgment and thought content normal.    Lab Results  Component Value Date   WBC 5.6 09/11/2013   HGB 13.1 09/11/2013   HCT 39.1 09/11/2013   PLT 244.0 09/11/2013   GLUCOSE 93 09/11/2013   CHOL 174 09/11/2013   TRIG 80.0 09/11/2013   HDL 82.20 09/11/2013   LDLDIRECT 64.8 09/23/2008   LDLCALC 76 09/11/2013   ALT 13 09/11/2013   AST 20 09/11/2013   NA 139 09/11/2013   K 3.6 09/11/2013   CL 103 09/11/2013   CREATININE 0.8 09/11/2013   BUN 9 09/11/2013   CO2 29 09/11/2013   TSH 1.80 09/11/2013  INR 1.0 01/17/2008         Assessment & Plan:

## 2014-03-12 NOTE — Assessment & Plan Note (Signed)
2015 x episodes ?hives Benadryl prn

## 2014-03-12 NOTE — Progress Notes (Signed)
Pre visit review using our clinic review tool, if applicable. No additional management support is needed unless otherwise documented below in the visit note. 

## 2014-03-13 ENCOUNTER — Telehealth: Payer: Self-pay | Admitting: Internal Medicine

## 2014-03-13 NOTE — Telephone Encounter (Signed)
Relevant patient education assigned to patient using Emmi. ° °

## 2014-04-11 ENCOUNTER — Other Ambulatory Visit: Payer: Self-pay | Admitting: Internal Medicine

## 2014-04-11 DIAGNOSIS — H353290 Exudative age-related macular degeneration, unspecified eye, stage unspecified: Secondary | ICD-10-CM | POA: Diagnosis not present

## 2014-04-11 DIAGNOSIS — Z961 Presence of intraocular lens: Secondary | ICD-10-CM | POA: Diagnosis not present

## 2014-04-11 DIAGNOSIS — H353190 Nonexudative age-related macular degeneration, unspecified eye, stage unspecified: Secondary | ICD-10-CM | POA: Diagnosis not present

## 2014-04-11 DIAGNOSIS — Z9889 Other specified postprocedural states: Secondary | ICD-10-CM | POA: Diagnosis not present

## 2014-05-06 DIAGNOSIS — Z23 Encounter for immunization: Secondary | ICD-10-CM | POA: Diagnosis not present

## 2014-06-02 ENCOUNTER — Emergency Department (HOSPITAL_BASED_OUTPATIENT_CLINIC_OR_DEPARTMENT_OTHER)
Admission: EM | Admit: 2014-06-02 | Discharge: 2014-06-02 | Disposition: A | Discharge status: Home / Self Care | Payer: Medicare Other | Attending: Emergency Medicine | Admitting: Emergency Medicine

## 2014-06-02 ENCOUNTER — Emergency Department (HOSPITAL_BASED_OUTPATIENT_CLINIC_OR_DEPARTMENT_OTHER): Payer: Medicare Other

## 2014-06-02 ENCOUNTER — Encounter (HOSPITAL_BASED_OUTPATIENT_CLINIC_OR_DEPARTMENT_OTHER): Payer: Self-pay | Admitting: *Deleted

## 2014-06-02 ENCOUNTER — Emergency Department (HOSPITAL_BASED_OUTPATIENT_CLINIC_OR_DEPARTMENT_OTHER): Admission: EM | Admit: 2014-06-02 | Payer: BC Managed Care – PPO | Source: Home / Self Care

## 2014-06-02 DIAGNOSIS — Z7982 Long term (current) use of aspirin: Secondary | ICD-10-CM | POA: Insufficient documentation

## 2014-06-02 DIAGNOSIS — Y9289 Other specified places as the place of occurrence of the external cause: Secondary | ICD-10-CM | POA: Insufficient documentation

## 2014-06-02 DIAGNOSIS — Z8739 Personal history of other diseases of the musculoskeletal system and connective tissue: Secondary | ICD-10-CM | POA: Diagnosis not present

## 2014-06-02 DIAGNOSIS — E785 Hyperlipidemia, unspecified: Secondary | ICD-10-CM | POA: Diagnosis not present

## 2014-06-02 DIAGNOSIS — S300XXA Contusion of lower back and pelvis, initial encounter: Secondary | ICD-10-CM | POA: Diagnosis not present

## 2014-06-02 DIAGNOSIS — Y9389 Activity, other specified: Secondary | ICD-10-CM | POA: Diagnosis not present

## 2014-06-02 DIAGNOSIS — Z880 Allergy status to penicillin: Secondary | ICD-10-CM | POA: Insufficient documentation

## 2014-06-02 DIAGNOSIS — Z7952 Long term (current) use of systemic steroids: Secondary | ICD-10-CM | POA: Insufficient documentation

## 2014-06-02 DIAGNOSIS — Z79899 Other long term (current) drug therapy: Secondary | ICD-10-CM | POA: Insufficient documentation

## 2014-06-02 DIAGNOSIS — M25551 Pain in right hip: Secondary | ICD-10-CM | POA: Diagnosis not present

## 2014-06-02 DIAGNOSIS — Z8719 Personal history of other diseases of the digestive system: Secondary | ICD-10-CM | POA: Insufficient documentation

## 2014-06-02 DIAGNOSIS — F329 Major depressive disorder, single episode, unspecified: Secondary | ICD-10-CM | POA: Diagnosis not present

## 2014-06-02 DIAGNOSIS — F419 Anxiety disorder, unspecified: Secondary | ICD-10-CM | POA: Insufficient documentation

## 2014-06-02 DIAGNOSIS — I10 Essential (primary) hypertension: Secondary | ICD-10-CM | POA: Diagnosis not present

## 2014-06-02 DIAGNOSIS — W010XXA Fall on same level from slipping, tripping and stumbling without subsequent striking against object, initial encounter: Secondary | ICD-10-CM | POA: Diagnosis not present

## 2014-06-02 DIAGNOSIS — S3992XA Unspecified injury of lower back, initial encounter: Secondary | ICD-10-CM | POA: Diagnosis present

## 2014-06-02 DIAGNOSIS — W19XXXA Unspecified fall, initial encounter: Secondary | ICD-10-CM

## 2014-06-02 DIAGNOSIS — S79911A Unspecified injury of right hip, initial encounter: Secondary | ICD-10-CM | POA: Diagnosis not present

## 2014-06-02 NOTE — ED Notes (Signed)
MD with pt  

## 2014-06-02 NOTE — ED Provider Notes (Signed)
CSN: [id number]     Arrival date & time 06/02/14  9935 History   First MD Initiated Contact with Patient 06/02/14 0448     Chief Complaint  Patient presents with  . Fall     (Consider location/radiation/quality/duration/timing/severity/associated sxs/prior Treatment) HPI See Epic downtime paperwork for vitals and PMH update, which were reviewed by myself.  This is an 80 year old female who tripped and fell at home last night about 8 PM. She landed on her right buttock. She denies any loss of consciousness. She is complaining of pain in her right buttock. There is swelling associated. She is able to bear weight on her right leg but experiences pain when walking. She has a history of bilateral hip replacements.   Past Medical History  Diagnosis Date  . Depression   . Hypertension   . Anxiety   . GERD (gastroesophageal reflux disease)   . Allergy   . Hyperlipidemia   . Osteopenia   . Low back pain   . PAC (premature atrial contraction)   . Diverticulitis    Past Surgical History  Procedure Laterality Date  . Cholecystectomy  2006  . Total hip arthroplasty  2008    Right   Family History  Problem Relation Age of Onset  . Hypertension Other   . Hypertension Mother    History  Substance Use Topics  . Smoking status: Never Smoker   . Smokeless tobacco: Never Used  . Alcohol Use: No   OB History    No data available     Review of Systems  All other systems reviewed and are negative.   Allergies  Ace inhibitors and Penicillins  Home Medications   Prior to Admission medications   Medication Sig Start Date End Date Taking? Authorizing Provider  aspirin 81 MG tablet Take 81 mg by mouth daily.      Historical Provider, MD  BENICAR 20 MG tablet TAKE 1 TABLET (20 MG TOTAL) BY MOUTH DAILY. 04/11/14   Aleksei Plotnikov V, MD  benzonatate (TESSALON) 100 MG capsule TAKE 1 TO 2 CAPSULES TWICE A DAY AS NEEDED COUGH 09/23/12   Lew Dawes V, MD  Cholecalciferol (VITAMIN  D3) 1000 UNITS CAPS Take by mouth.      Historical Provider, MD  clorazepate (TRANXENE) 7.5 MG tablet Take 2 tablets (15 mg total) by mouth 3 (three) times daily as needed for anxiety or sleep. 03/12/14   Aleksei Plotnikov V, MD  EVISTA 60 MG tablet TAKE 1 TABLET BY MOUTH DAILY 01/20/13   Aleksei Plotnikov V, MD  fexofenadine (ALLEGRA) 180 MG tablet Take 1 tablet (180 mg total) by mouth daily. 12/21/11   Aleksei Plotnikov V, MD  fluticasone (FLONASE) 50 MCG/ACT nasal spray Place 1 spray into both nostrils daily. As needed 09/11/13   Cassandria Anger, MD  hydrochlorothiazide (MICROZIDE) 12.5 MG capsule Take 1 capsule (12.5 mg total) by mouth every morning. 03/12/14   Aleksei Plotnikov V, MD  Magnesium Gluconate 550 MG TABS Take by mouth daily.      Historical Provider, MD  Multiple Vitamins-Minerals (PRESERVISION/LUTEIN) CAPS Take by mouth 2 (two) times daily.      Historical Provider, MD  olmesartan (BENICAR) 20 MG tablet Take 1 tablet (20 mg total) by mouth daily. 03/12/14   Aleksei Plotnikov V, MD  PARoxetine (PAXIL) 10 MG tablet Take 1 tablet (10 mg total) by mouth daily. 03/12/14   Aleksei Plotnikov V, MD  prednisoLONE acetate (PRED FORTE) 1 % ophthalmic suspension Place 1 drop into the  left eye 4 (four) times daily.    Historical Provider, MD  simvastatin (ZOCOR) 10 MG tablet Take 1 tablet (10 mg total) by mouth at bedtime. 03/12/14   Aleksei Plotnikov V, MD  triamcinolone cream (KENALOG) 0.5 % Apply topically 2 (two) times daily. 03/12/14   Cassandria Anger, MD    Physical Exam  General: Well-developed, well-nourished female in no acute distress; appearance consistent with age of record HENT: normocephalic; atraumatic Eyes: pupils equal, round and reactive to light; extraocular muscles intact; lens implants Neck: supple; nontender Heart: regular rate and rhythm Lungs: clear to auscultation bilaterally Back: No T-spine, LS spine or coccygeal tenderness Abdomen: soft; nondistended; nontender;  no masses or hepatosplenomegaly; bowel sounds present Extremities: Arthritic changes; pulses normal; hematoma of right buttock with mild tenderness Neurologic: Awake, alert and oriented; motor function intact in all extremities and symmetric; no facial droop; normal gait Skin: Warm and dry Psychiatric: Normal mood and affect    ED Course  Procedures (including critical care time)   MDM  Nursing notes and vitals signs, including pulse oximetry, reviewed.  Summary of this visit's results, reviewed by myself:  Imaging Studies: Dg Hip Complete Right  06/02/2014   CLINICAL DATA:  Initial evaluation for acute trauma, fall. Right hip pain.  EXAM: RIGHT HIP - COMPLETE 2+ VIEW  COMPARISON:  Prior radiograph from 11/16/2006  FINDINGS: Bilateral hip arthroplasties are in place. The acetabular and femoral prosthetic components articulate normally with one another. No periprosthetic lucency to suggest loosening or infection. No acute fracture or dislocation.  Diffuse osteopenia noted.  No acute soft tissue abnormality.  IMPRESSION: 1. No acute fracture or dislocation. 2. Right total hip arthroplasty in place without complication. 3. Grossly intact left hip arthroplasty.   Electronically Signed   By: Jeannine Boga M.D.   On: 06/02/2014 05:43        Wynetta Fines, MD 06/02/14 [phone]

## 2014-06-02 NOTE — ED Notes (Signed)
Pt states that she tripped and fell last night around 8pm landing on her right hip. Denies any loc or hitting her head. C/o right hip pain. States swelling to right hip. States able to bear weight but painful.

## 2014-06-02 NOTE — ED Notes (Signed)
Transported to xray 

## 2014-06-12 ENCOUNTER — Encounter: Payer: Self-pay | Admitting: Internal Medicine

## 2014-06-12 ENCOUNTER — Ambulatory Visit (INDEPENDENT_AMBULATORY_CARE_PROVIDER_SITE_OTHER): Payer: Medicare Other | Admitting: Internal Medicine

## 2014-06-12 VITALS — BP 110/72 | HR 75 | Temp 98.2°F | Resp 14 | Ht 69.0 in | Wt 152.1 lb

## 2014-06-12 DIAGNOSIS — S7001XD Contusion of right hip, subsequent encounter: Secondary | ICD-10-CM

## 2014-06-12 DIAGNOSIS — F4321 Adjustment disorder with depressed mood: Secondary | ICD-10-CM

## 2014-06-12 DIAGNOSIS — S7001XA Contusion of right hip, initial encounter: Secondary | ICD-10-CM | POA: Insufficient documentation

## 2014-06-12 NOTE — Progress Notes (Signed)
Pre visit review using our clinic review tool, if applicable. No additional management support is needed unless otherwise documented below in the visit note. 

## 2014-06-12 NOTE — Progress Notes (Signed)
Subjective:   On 11/1 pt tripped and fell at home last night about 8 PM. She landed on her right buttock. She denies any loss of consciousness. She is complaining of pain in her right buttock. There is swelling associated. She is able to bear weight on her right leg but experiences pain when walking. She has a history of bilateral hip replacements.   Back Pain Pertinent negatives include no abdominal pain, dysuria, headaches or weakness.   F/u stress w/sick husband: Ron is at the [facility] in [place]. Ron is still demanding a lot of Gwen's attention and time...  The patient is here to follow up on chronic LBP,  depression, anxiety, headaches and chronic moderate fibromyalgia symptoms controlled with medicines, diet and exercise. Her dizzy spell has resolved  Wt Readings from Last 3 Encounters:  06/12/14 152 lb 1.9 oz (69.001 kg)  03/12/14 153 lb (69.4 kg)  09/11/13 150 lb (68.04 kg)   BP Readings from Last 3 Encounters:  06/12/14 110/72  06/02/14 122/63  03/12/14 144/85       Review of Systems  Constitutional: Negative for chills, activity change, appetite change, fatigue and unexpected weight change.  HENT: Negative for congestion, mouth sores and sinus pressure.   Eyes: Negative for visual disturbance.  Respiratory: Negative for cough and chest tightness.   Gastrointestinal: Negative for nausea and abdominal pain.  Genitourinary: Negative for dysuria, frequency, difficulty urinating and vaginal pain.  Musculoskeletal: Positive for back pain. Negative for gait problem.  Skin: Negative for pallor and rash.  Neurological: Negative for dizziness, tremors, weakness and headaches.  Psychiatric/Behavioral: Negative for confusion and sleep disturbance.       Objective:   Physical Exam  Constitutional: She appears well-developed. No distress.  HENT:  Head: Normocephalic.  Right Ear: External ear normal.  Left Ear: External ear normal.  Nose: Nose normal.  Mouth/Throat:  Oropharynx is clear and moist.  Eyes: Conjunctivae are normal. Pupils are equal, round, and reactive to light. Right eye exhibits no discharge. Left eye exhibits no discharge.  Neck: Normal range of motion. Neck supple. No JVD present. No tracheal deviation present. No thyromegaly present.  Cardiovascular: Normal rate, regular rhythm and normal heart sounds.   Pulmonary/Chest: No stridor. No respiratory distress. She has no wheezes.  Abdominal: Soft. Bowel sounds are normal. She exhibits no distension and no mass. There is no tenderness. There is no rebound and no guarding.  Musculoskeletal: She exhibits no edema or tenderness.  Lymphadenopathy:    She has no cervical adenopathy.  Neurological: She displays normal reflexes. No cranial nerve deficit. She exhibits normal muscle tone. Coordination normal.  Skin: No rash noted. No erythema.  Psychiatric: She has a normal mood and affect. Her behavior is normal. Judgment and thought content normal.  eyeglasses are densly powdered w/a makeup flakes R buttock and R hip bruise  Lab Results  Component Value Date   WBC 5.5 03/12/2014   HGB 13.5 03/12/2014   HCT 39.8 03/12/2014   PLT 249.0 03/12/2014   GLUCOSE 85 03/12/2014   CHOL 174 09/11/2013   TRIG 80.0 09/11/2013   HDL 82.20 09/11/2013   LDLDIRECT 64.8 09/23/2008   LDLCALC 76 09/11/2013   ALT 13 03/12/2014   AST 18 03/12/2014   NA 138 03/12/2014   K 3.9 03/12/2014   CL 100 03/12/2014   CREATININE 0.8 03/12/2014   BUN 10 03/12/2014   CO2 25 03/12/2014   TSH 2.14 03/12/2014   INR 1.0 01/17/2008    A  complex case >40 min     Assessment & Plan:

## 2014-06-12 NOTE — Assessment & Plan Note (Addendum)
11/11 fall Stress  Management and rest discussed Eyeglasses cleaned

## 2014-06-12 NOTE — Assessment & Plan Note (Addendum)
D/c Paxil - pt wishes to stop Stress management and CNS exhaustion issues were discussed Rest more

## 2014-06-13 DIAGNOSIS — Z961 Presence of intraocular lens: Secondary | ICD-10-CM | POA: Diagnosis not present

## 2014-06-13 DIAGNOSIS — H3532 Exudative age-related macular degeneration: Secondary | ICD-10-CM | POA: Diagnosis not present

## 2014-06-13 DIAGNOSIS — H3531 Nonexudative age-related macular degeneration: Secondary | ICD-10-CM | POA: Diagnosis not present

## 2014-06-13 DIAGNOSIS — Z9889 Other specified postprocedural states: Secondary | ICD-10-CM | POA: Diagnosis not present

## 2014-06-26 DIAGNOSIS — M25511 Pain in right shoulder: Secondary | ICD-10-CM | POA: Diagnosis not present

## 2014-06-26 DIAGNOSIS — M7541 Impingement syndrome of right shoulder: Secondary | ICD-10-CM | POA: Diagnosis not present

## 2014-08-15 DIAGNOSIS — Z9889 Other specified postprocedural states: Secondary | ICD-10-CM | POA: Diagnosis not present

## 2014-08-15 DIAGNOSIS — H3532 Exudative age-related macular degeneration: Secondary | ICD-10-CM | POA: Diagnosis not present

## 2014-08-15 DIAGNOSIS — H3531 Nonexudative age-related macular degeneration: Secondary | ICD-10-CM | POA: Diagnosis not present

## 2014-08-15 DIAGNOSIS — Z961 Presence of intraocular lens: Secondary | ICD-10-CM | POA: Diagnosis not present

## 2014-08-26 ENCOUNTER — Telehealth: Payer: Self-pay | Admitting: Internal Medicine

## 2014-08-26 NOTE — Telephone Encounter (Signed)
Patient's daughter called stating they need [place] to do PA for Benicar. She states the pharmacy has sent [place] several requests. Call pts daughter, Sula Soda, at [phone].

## 2014-08-27 NOTE — Telephone Encounter (Signed)
The patient's daughter called again and want the doctor to do the prior authorization with the state health plan so she can get her meds. Until they get this prior authorization she has to pay $200 for medication. [phone]

## 2014-08-28 ENCOUNTER — Emergency Department (HOSPITAL_BASED_OUTPATIENT_CLINIC_OR_DEPARTMENT_OTHER)
Admission: EM | Admit: 2014-08-28 | Discharge: 2014-08-28 | Disposition: A | Discharge status: Home / Self Care | Payer: Medicare Other | Attending: Emergency Medicine | Admitting: Emergency Medicine

## 2014-08-28 ENCOUNTER — Encounter (HOSPITAL_BASED_OUTPATIENT_CLINIC_OR_DEPARTMENT_OTHER): Payer: Self-pay | Admitting: Emergency Medicine

## 2014-08-28 ENCOUNTER — Telehealth: Payer: Self-pay | Admitting: Internal Medicine

## 2014-08-28 ENCOUNTER — Emergency Department (HOSPITAL_BASED_OUTPATIENT_CLINIC_OR_DEPARTMENT_OTHER): Payer: Medicare Other

## 2014-08-28 DIAGNOSIS — T07XXXA Unspecified multiple injuries, initial encounter: Secondary | ICD-10-CM

## 2014-08-28 DIAGNOSIS — Y998 Other external cause status: Secondary | ICD-10-CM | POA: Insufficient documentation

## 2014-08-28 DIAGNOSIS — S299XXA Unspecified injury of thorax, initial encounter: Secondary | ICD-10-CM | POA: Diagnosis present

## 2014-08-28 DIAGNOSIS — Y92003 Bedroom of unspecified non-institutional (private) residence as the place of occurrence of the external cause: Secondary | ICD-10-CM | POA: Insufficient documentation

## 2014-08-28 DIAGNOSIS — E871 Hypo-osmolality and hyponatremia: Secondary | ICD-10-CM | POA: Diagnosis not present

## 2014-08-28 DIAGNOSIS — I10 Essential (primary) hypertension: Secondary | ICD-10-CM | POA: Insufficient documentation

## 2014-08-28 DIAGNOSIS — Z7952 Long term (current) use of systemic steroids: Secondary | ICD-10-CM | POA: Diagnosis not present

## 2014-08-28 DIAGNOSIS — S7011XA Contusion of right thigh, initial encounter: Secondary | ICD-10-CM | POA: Diagnosis not present

## 2014-08-28 DIAGNOSIS — F329 Major depressive disorder, single episode, unspecified: Secondary | ICD-10-CM | POA: Insufficient documentation

## 2014-08-28 DIAGNOSIS — Z79899 Other long term (current) drug therapy: Secondary | ICD-10-CM | POA: Insufficient documentation

## 2014-08-28 DIAGNOSIS — W01198A Fall on same level from slipping, tripping and stumbling with subsequent striking against other object, initial encounter: Secondary | ICD-10-CM | POA: Diagnosis not present

## 2014-08-28 DIAGNOSIS — S3992XA Unspecified injury of lower back, initial encounter: Secondary | ICD-10-CM | POA: Diagnosis not present

## 2014-08-28 DIAGNOSIS — S40021A Contusion of right upper arm, initial encounter: Secondary | ICD-10-CM | POA: Diagnosis not present

## 2014-08-28 DIAGNOSIS — Z8719 Personal history of other diseases of the digestive system: Secondary | ICD-10-CM | POA: Diagnosis not present

## 2014-08-28 DIAGNOSIS — Z7982 Long term (current) use of aspirin: Secondary | ICD-10-CM | POA: Diagnosis not present

## 2014-08-28 DIAGNOSIS — S8012XA Contusion of left lower leg, initial encounter: Secondary | ICD-10-CM | POA: Insufficient documentation

## 2014-08-28 DIAGNOSIS — Y9389 Activity, other specified: Secondary | ICD-10-CM | POA: Insufficient documentation

## 2014-08-28 DIAGNOSIS — E785 Hyperlipidemia, unspecified: Secondary | ICD-10-CM | POA: Diagnosis not present

## 2014-08-28 DIAGNOSIS — F419 Anxiety disorder, unspecified: Secondary | ICD-10-CM | POA: Diagnosis not present

## 2014-08-28 DIAGNOSIS — S20211A Contusion of right front wall of thorax, initial encounter: Secondary | ICD-10-CM | POA: Insufficient documentation

## 2014-08-28 DIAGNOSIS — M533 Sacrococcygeal disorders, not elsewhere classified: Secondary | ICD-10-CM | POA: Diagnosis not present

## 2014-08-28 DIAGNOSIS — W19XXXA Unspecified fall, initial encounter: Secondary | ICD-10-CM

## 2014-08-28 DIAGNOSIS — M8580 Other specified disorders of bone density and structure, unspecified site: Secondary | ICD-10-CM | POA: Diagnosis not present

## 2014-08-28 LAB — CBC WITH DIFFERENTIAL/PLATELET
BASOS PCT: 0 % (ref 0–1)
Basophils Absolute: 0 10*3/uL (ref 0.0–0.1)
EOS PCT: 1 % (ref 0–5)
Eosinophils Absolute: 0.1 10*3/uL (ref 0.0–0.7)
HCT: 35.3 % — ABNORMAL LOW (ref 36.0–46.0)
HEMOGLOBIN: 11.9 g/dL — AB (ref 12.0–15.0)
LYMPHS PCT: 14 % (ref 12–46)
Lymphs Abs: 0.7 10*3/uL (ref 0.7–4.0)
MCH: 30.7 pg (ref 26.0–34.0)
MCHC: 33.7 g/dL (ref 30.0–36.0)
MCV: 91 fL (ref 78.0–100.0)
Monocytes Absolute: 0.5 10*3/uL (ref 0.1–1.0)
Monocytes Relative: 10 % (ref 3–12)
NEUTROS ABS: 3.8 10*3/uL (ref 1.7–7.7)
Neutrophils Relative %: 75 % (ref 43–77)
Platelets: 164 10*3/uL (ref 150–400)
RBC: 3.88 MIL/uL (ref 3.87–5.11)
RDW: 12.5 % (ref 11.5–15.5)
WBC: 5 10*3/uL (ref 4.0–10.5)

## 2014-08-28 LAB — BASIC METABOLIC PANEL
Anion gap: 5 (ref 5–15)
BUN: 12 mg/dL (ref 6–23)
CALCIUM: 8.6 mg/dL (ref 8.4–10.5)
CO2: 27 mmol/L (ref 19–32)
Chloride: 97 mmol/L (ref 96–112)
Creatinine, Ser: 0.69 mg/dL (ref 0.50–1.10)
GFR calc Af Amer: >90 mL/min (ref >90)
GFR calc non Af Amer: 78 mL/min — ABNORMAL LOW (ref >90)
GLUCOSE: 87 mg/dL (ref 70–99)
POTASSIUM: 3.4 mmol/L — AB (ref 3.5–5.1)
Sodium: 129 mmol/L — ABNORMAL LOW (ref 135–145)

## 2014-08-28 LAB — URINALYSIS, ROUTINE W REFLEX MICROSCOPIC
Bilirubin Urine: NEGATIVE
Glucose, UA: NEGATIVE mg/dL
Hgb urine dipstick: NEGATIVE
Ketones, ur: NEGATIVE mg/dL
LEUKOCYTES UA: NEGATIVE
Nitrite: NEGATIVE
PROTEIN: NEGATIVE mg/dL
SPECIFIC GRAVITY, URINE: 1.008 (ref 1.005–1.030)
Urobilinogen, UA: 2 mg/dL — ABNORMAL HIGH (ref 0.0–1.0)
pH: 7 (ref 5.0–8.0)

## 2014-08-28 NOTE — Telephone Encounter (Signed)
We can switch to Losartan 100 mg/d #30 11 ref Thx

## 2014-08-28 NOTE — ED Notes (Signed)
Pt and family upset that it is taking so long.  Explained to patient and family that it has been very busy but that I would speak to MD.  Informed MD about the pt/family being upset.  She stated that she would go speak to them shortly.

## 2014-08-28 NOTE — Telephone Encounter (Signed)
Patient called to advise that she went to [facility] ED- she was told by the provider there to advise sodium level was 129. She wants to know if this is cause for alarm of if she needs to be seen.

## 2014-08-28 NOTE — Discharge Instructions (Signed)
Hyponatremia  Hyponatremia is when the amount of salt (sodium) in your blood is too low. When sodium levels are low, your cells will absorb extra water and swell. The swelling happens throughout the body, but it mostly affects the brain. Severe brain swelling (cerebral edema), seizures, or coma can happen.  CAUSES   Heart, kidney, or liver problems.  Thyroid problems.  Adrenal gland problems.  Severe vomiting and diarrhea.  Certain medicines or illegal drugs.  Dehydration.  Drinking too much water.  Low-sodium diet. SYMPTOMS   Nausea and vomiting.  Confusion.  Lethargy.  Agitation.  Headache.  Twitching or shaking (seizures).  Unconsciousness.  Appetite loss.  Muscle weakness and cramping. DIAGNOSIS  Hyponatremia is identified by a simple blood test. Your caregiver will perform a history and physical exam to try to find the cause and type of hyponatremia. Other tests may be needed to measure the amount of sodium in your blood and urine. TREATMENT  Treatment will depend on the cause.   Fluids may be given through the vein (IV).  Medicines may be used to correct the sodium imbalance. If medicines are causing the problem, they will need to be adjusted.  Water or fluid intake may be restricted to restore proper balance. The speed of correcting the sodium problem is very important. If the problem is corrected too fast, nerve damage (sometimes unchangeable) can happen. HOME CARE INSTRUCTIONS   Only take medicines as directed by your caregiver. Many medicines can make hyponatremia worse. Discuss all your medicines with your caregiver.  Carefully follow any recommended diet, including any fluid restrictions.  You may be asked to repeat lab tests. Follow these directions.  Avoid alcohol and recreational drugs. SEEK MEDICAL CARE IF:   You develop worsening nausea, fatigue, headache, confusion, or weakness.  Your original hyponatremia symptoms return.  You have  problems following the recommended diet. SEEK IMMEDIATE MEDICAL CARE IF:   You have a seizure.  You faint.  You have ongoing diarrhea or vomiting. MAKE SURE YOU:   Understand these instructions.  Will watch your condition.  Will get help right away if you are not doing well or get worse. Document Released: 07/09/2002 Document Revised: 10/11/2011 Document Reviewed: 01/03/2011 [facility] Patient Information 2015 [place], [place]. This information is not intended to replace advice given to you by your health care provider. Make sure you discuss any questions you have with your health care provider.  Contusion A contusion is a deep bruise. Contusions are the result of an injury that caused bleeding under the skin. The contusion may turn blue, purple, or yellow. Minor injuries will give you a painless contusion, but more severe contusions may stay painful and swollen for a few weeks.  CAUSES  A contusion is usually caused by a blow, trauma, or direct force to an area of the body. SYMPTOMS   Swelling and redness of the injured area.  Bruising of the injured area.  Tenderness and soreness of the injured area.  Pain. DIAGNOSIS  The diagnosis can be made by taking a history and physical exam. An X-ray, CT scan, or MRI may be needed to determine if there were any associated injuries, such as fractures. TREATMENT  Specific treatment will depend on what area of the body was injured. In general, the best treatment for a contusion is resting, icing, elevating, and applying cold compresses to the injured area. Over-the-counter medicines may also be recommended for pain control. Ask your caregiver what the best treatment is for your contusion. HOME  CARE INSTRUCTIONS   Put ice on the injured area.  Put ice in a plastic bag.  Place a towel between your skin and the bag.  Leave the ice on for 15-20 minutes, 3-4 times a day, or as directed by your health care provider.  Only take  over-the-counter or prescription medicines for pain, discomfort, or fever as directed by your caregiver. Your caregiver may recommend avoiding anti-inflammatory medicines (aspirin, ibuprofen, and naproxen) for 48 hours because these medicines may increase bruising.  Rest the injured area.  If possible, elevate the injured area to reduce swelling. SEEK IMMEDIATE MEDICAL CARE IF:   You have increased bruising or swelling.  You have pain that is getting worse.  Your swelling or pain is not relieved with medicines. MAKE SURE YOU:   Understand these instructions.  Will watch your condition.  Will get help right away if you are not doing well or get worse. Document Released: 04/28/2005 Document Revised: 07/24/2013 Document Reviewed: 05/24/2011 [facility] Patient Information 2015 [place], [place]. This information is not intended to replace advice given to you by your health care provider. Make sure you discuss any questions you have with your health care provider.  Fall Prevention and Home Safety Falls cause injuries and can affect all age groups. It is possible to use preventive measures to significantly decrease the likelihood of falls. There are many simple measures which can make your home safer and prevent falls. OUTDOORS  Repair cracks and edges of walkways and driveways.  Remove high doorway thresholds.  Trim shrubbery on the main path into your home.  Have good outside lighting.  Clear walkways of tools, rocks, debris, and clutter.  Check that handrails are not broken and are securely fastened. Both sides of steps should have handrails.  Have leaves, snow, and ice cleared regularly.  Use sand or salt on walkways during winter months.  In the garage, clean up grease or oil spills. BATHROOM  Install night lights.  Install grab bars by the toilet and in the tub and shower.  Use non-skid mats or decals in the tub or shower.  Place a plastic non-slip stool in the shower  to sit on, if needed.  Keep floors dry and clean up all water on the floor immediately.  Remove soap buildup in the tub or shower on a regular basis.  Secure bath mats with non-slip, double-sided rug tape.  Remove throw rugs and tripping hazards from the floors. BEDROOMS  Install night lights.  Make sure a bedside light is easy to reach.  Do not use oversized bedding.  Keep a telephone by your bedside.  Have a firm chair with side arms to use for getting dressed.  Remove throw rugs and tripping hazards from the floor. KITCHEN  Keep handles on pots and pans turned toward the center of the stove. Use back burners when possible.  Clean up spills quickly and allow time for drying.  Avoid walking on wet floors.  Avoid hot utensils and knives.  Position shelves so they are not too high or low.  Place commonly used objects within easy reach.  If necessary, use a sturdy step stool with a grab bar when reaching.  Keep electrical cables out of the way.  Do not use floor polish or wax that makes floors slippery. If you must use wax, use non-skid floor wax.  Remove throw rugs and tripping hazards from the floor. STAIRWAYS  Never leave objects on stairs.  Place handrails on both sides of stairways  and use them. Fix any loose handrails. Make sure handrails on both sides of the stairways are as long as the stairs.  Check carpeting to make sure it is firmly attached along stairs. Make repairs to worn or loose carpet promptly.  Avoid placing throw rugs at the top or bottom of stairways, or properly secure the rug with carpet tape to prevent slippage. Get rid of throw rugs, if possible.  Have an electrician put in a light switch at the top and bottom of the stairs. OTHER FALL PREVENTION TIPS  Wear low-heel or rubber-soled shoes that are supportive and fit well. Wear closed toe shoes.  When using a stepladder, make sure it is fully opened and both spreaders are firmly locked.  Do not climb a closed stepladder.  Add color or contrast paint or tape to grab bars and handrails in your home. Place contrasting color strips on first and last steps.  Learn and use mobility aids as needed. Install an electrical emergency response system.  Turn on lights to avoid dark areas. Replace light bulbs that burn out immediately. Get light switches that glow.  Arrange furniture to create clear pathways. Keep furniture in the same place.  Firmly attach carpet with non-skid or double-sided tape.  Eliminate uneven floor surfaces.  Select a carpet pattern that does not visually hide the edge of steps.  Be aware of all pets. OTHER HOME SAFETY TIPS  Set the water temperature for 120 F (48.8 C).  Keep emergency numbers on or near the telephone.  Keep smoke detectors on every level of the home and near sleeping areas. Document Released: 07/09/2002 Document Revised: 01/18/2012 Document Reviewed: 10/08/2011 [facility] Patient Information 2015 [place], [place]. This information is not intended to replace advice given to you by your health care provider. Make sure you discuss any questions you have with your health care provider.

## 2014-08-28 NOTE — ED Notes (Signed)
Pt states fell Sunday while reaching to get out of bed. Pt complaining of tailbone discomfort. Pt denies hitting head. Pt also states she has been fighting "sinus issues" since last Saturday.

## 2014-08-28 NOTE — ED Provider Notes (Signed)
CSN: [id number]     Arrival date & time 08/28/14  0846 History   First MD Initiated Contact with Patient 08/28/14 1015     Chief Complaint  Patient presents with  . Fall     (Consider location/radiation/quality/duration/timing/severity/associated sxs/prior Treatment) HPI The patient reports that she got up to use the bathroom last night and reached out for something to hold onto but missed period she reports she has 2 steps to get up into her bed and she ended up falling backwards and hitting her buttock and her side on her steps in her night table. She reports she has pain right at the coccyx bone but otherwise she states that she has been up and walking about and does not have other pain issues relating to her fall. Her son reports that she started to develop sinus symptoms on Saturday with a lot of nasal drainage. He reports when she gets a sinus infection and gets pretty bad. She has been taking Allegra and another over-the-counter medication to come back to her sinus symptoms. She has not had any fever or headache. There is been no cough. The patient reports that she has felt a little unsteady for several days. She mostly attributes that to her age. She denies any focal area of weakness or numbness. She reports her balance has just been a little off. Past Medical History  Diagnosis Date  . Depression   . Hypertension   . Anxiety   . GERD (gastroesophageal reflux disease)   . Allergy   . Hyperlipidemia   . Osteopenia   . Low back pain   . PAC (premature atrial contraction)   . Diverticulitis    Past Surgical History  Procedure Laterality Date  . Cholecystectomy  2006  . Total hip arthroplasty  2008    Right   Family History  Problem Relation Age of Onset  . Hypertension Other   . Hypertension Mother    History  Substance Use Topics  . Smoking status: Never Smoker   . Smokeless tobacco: Never Used  . Alcohol Use: No   OB History    No data available     Review of  Systems 10 Systems reviewed and are negative for acute change except as noted in the HPI.    Allergies  Ace inhibitors and Penicillins  Home Medications   Prior to Admission medications   Medication Sig Start Date End Date Taking? Authorizing Provider  aspirin 81 MG tablet Take 81 mg by mouth daily.      Historical Provider, MD  BENICAR 20 MG tablet TAKE 1 TABLET (20 MG TOTAL) BY MOUTH DAILY. 04/11/14   Aleksei Plotnikov V, MD  benzonatate (TESSALON) 100 MG capsule TAKE 1 TO 2 CAPSULES TWICE A DAY AS NEEDED COUGH 09/23/12   Lew Dawes V, MD  Cholecalciferol (VITAMIN D3) 1000 UNITS CAPS Take by mouth.      Historical Provider, MD  clorazepate (TRANXENE) 7.5 MG tablet Take 2 tablets (15 mg total) by mouth 3 (three) times daily as needed for anxiety or sleep. 03/12/14   Aleksei Plotnikov V, MD  EVISTA 60 MG tablet TAKE 1 TABLET BY MOUTH DAILY 01/20/13   Aleksei Plotnikov V, MD  fexofenadine (ALLEGRA) 180 MG tablet Take 1 tablet (180 mg total) by mouth daily. 12/21/11   Aleksei Plotnikov V, MD  fluticasone (FLONASE) 50 MCG/ACT nasal spray Place 1 spray into both nostrils daily. As needed 09/11/13   Cassandria Anger, MD  hydrochlorothiazide (MICROZIDE) 12.5 MG  capsule Take 1 capsule (12.5 mg total) by mouth every morning. 03/12/14   Aleksei Plotnikov V, MD  Magnesium Gluconate 550 MG TABS Take by mouth daily.      Historical Provider, MD  Multiple Vitamins-Minerals (PRESERVISION/LUTEIN) CAPS Take by mouth 2 (two) times daily.      Historical Provider, MD  olmesartan (BENICAR) 20 MG tablet Take 1 tablet (20 mg total) by mouth daily. 03/12/14   Aleksei Plotnikov V, MD  prednisoLONE acetate (PRED FORTE) 1 % ophthalmic suspension Place 1 drop into the left eye 4 (four) times daily.    Historical Provider, MD  simvastatin (ZOCOR) 10 MG tablet Take 1 tablet (10 mg total) by mouth at bedtime. 03/12/14   Aleksei Plotnikov V, MD  triamcinolone cream (KENALOG) 0.5 % Apply topically 2 (two) times daily.  03/12/14   Aleksei Plotnikov V, MD   BP 125/77 mmHg  Pulse 85  Temp(Src) 98.6 F (37 C) (Oral)  Resp 16  Ht 5\' 10"  (1.778 m)  Wt 145 lb (65.772 kg)  BMI 20.81 kg/m2  SpO2 100% Physical Exam  Constitutional: She is oriented to person, place, and time. She appears well-developed and well-nourished.  HENT:  Head: Normocephalic and atraumatic.  Eyes: EOM are normal. Pupils are equal, round, and reactive to light.  Neck: Neck supple.  Cardiovascular: Normal rate, regular rhythm, normal heart sounds and intact distal pulses.   Pulmonary/Chest: Effort normal and breath sounds normal.  Abdominal: Soft. Bowel sounds are normal. She exhibits no distension. There is no tenderness.  Musculoskeletal: Normal range of motion. She exhibits no edema.  The patient has a linear ecchymoses approximately 8 cm on her right posterior upper arm. There is normal range of motion of the arm and no associated tenderness. The patient has another area of about 5 cm of ecchymoses on the mid lateral right posterior chest wall. The patient does not endorse any tenderness to palpation on the chest wall. There is a contusion on the right posterior lateral thigh approximately 10 x 5 cm. Again the patient denies any tenderness palpation in this region. There is a large contusion to the left lower leg approximately 10 x 10 cm with bluish discoloration. All skin surfaces overlying these injuries are intact. All range of motion at the extremities is normal without any evidence of pain with use.  Neurological: She is alert and oriented to person, place, and time. She has normal strength. Coordination normal. GCS eye subscore is 4. GCS verbal subscore is 5. GCS motor subscore is 6.  Skin: Skin is warm, dry and intact.  Psychiatric: She has a normal mood and affect.    ED Course  Procedures (including critical care time) Labs Review Labs Reviewed  BASIC METABOLIC PANEL - Abnormal; Notable for the following:    Sodium 129 (*)     Potassium 3.4 (*)    GFR calc non Af Amer 78 (*)    All other components within normal limits  CBC WITH DIFFERENTIAL/PLATELET - Abnormal; Notable for the following:    Hemoglobin 11.9 (*)    HCT 35.3 (*)    All other components within normal limits  URINALYSIS, ROUTINE W REFLEX MICROSCOPIC - Abnormal; Notable for the following:    Urobilinogen, UA 2.0 (*)    All other components within normal limits    Imaging Review Dg Sacrum/coccyx  08/28/2014   CLINICAL DATA:  81 year old female who fell onto buttocks 3 days ago with pain. Initial encounter.  EXAM: SACRUM AND COCCYX - 2+  VIEW  COMPARISON:  Pelvis x-rays 01/23/2008 and earlier.  FINDINGS: Chronic bilateral hip arthroplasties partially visible. Bone mineralization is within normal limits for age. Bilateral pubic rami appear stable and intact. SI joints within normal limits. Sacral segments appear to remain intact. Coccygeal segments within normal limits. No definite sacral ala fracture. The L5 level appears intact.  IMPRESSION: No acute fracture or dislocation identified about the sacrum or coccyx.   Electronically Signed   By: Lars Pinks M.D.   On: 08/28/2014 09:41     EKG Interpretation None      MDM   Final diagnoses:  Fall, initial encounter  Multiple contusions  Hyponatremia   Patient does not appear to have acute complicated injury secondary to her fall. She does have several contusions however no evidence of musculoskeletal dysfunction. She has been up and Ambien to her about the emergency department with a coordinated gait. There have been some concern of sinus infection. On examination the patient has a clear mental status and a normal ENT exam. She has complaints of clear nasal discharge. There is no associated headache. At this point I do not think antibiotics are indicated. Due to a report of some unsteadiness to the patient's gait basic lab work was obtained. Identified is hyponatremia. It is possible this is contributing  to the patient's unsteadiness although her neurologic examination is normal. At this point I do feel she is safe to increase her dietary salt intake and restrict fluid intake with a follow-up in 2 days with her primary provider.    Charlesetta Shanks, MD 08/28/14 1201

## 2014-08-28 NOTE — ED Notes (Signed)
Family member asking how long will it take to finish.  Explained to him that labs are still in process.  Walked pt to restroom for urinalysis and she related that her son was anxious to get to work because he was going on vacation.

## 2014-08-29 ENCOUNTER — Telehealth: Payer: Self-pay | Admitting: *Deleted

## 2014-08-29 MED ORDER — LOSARTAN POTASSIUM 100 MG PO TABS: 100.0000 mg | ORAL_TABLET | Freq: Every day | ORAL | Status: DC

## 2014-08-29 NOTE — Telephone Encounter (Signed)
Tried calling pt no answer, fax keep coming own. Called pt daughter Sula Soda gave her md response concerning labs. Made f/u appt for next Thurs 09/05/14 labs already entered. Also inform her once PA has been submitted after assistant get status will contact her to let her know outcome...Johny Chess

## 2014-08-29 NOTE — Telephone Encounter (Signed)
Done PA on cover-my-meds waiting on insurance approval.../lmb

## 2014-08-29 NOTE — Telephone Encounter (Signed)
Per [facility], patient refused losartan at the pharmacy. She is insisting that she be prescribed Benicar and that a prior authorization be completed. It needs to be back-dated to August 02, 2014. Maudie Mercury [phone]

## 2014-08-29 NOTE — Telephone Encounter (Signed)
Limit water intake if drinking too much liquids BMET, TSH and OV next week Thx

## 2014-08-29 NOTE — Telephone Encounter (Signed)
Dorian Pod at 08/29/2014 1:40 PM     Status: Signed       Expand All Collapse All   Per [facility], patient refused losartan at the pharmacy. She is insisting that she be prescribed Benicar and that a prior authorization be completed. It needs to be backdated to August 02, 2014. Maudie Mercury [phone]

## 2014-08-29 NOTE — Telephone Encounter (Signed)
Per [facility], patient refused losartan at the pharmacy. She is insisting that she be prescribed Benicar and that a prior authorization be completed. It needs to be backdated to August 02, 2014. Pamela Callahan [phone]

## 2014-08-29 NOTE — Telephone Encounter (Signed)
Phone call to patient's daughter Jeani Hawking. Left a message stating Dr Alain Marion wants to switch her mother to Losartan 100 mg. [facility] [place]

## 2014-08-30 ENCOUNTER — Encounter: Payer: Self-pay | Admitting: Internal Medicine

## 2014-08-30 ENCOUNTER — Ambulatory Visit (INDEPENDENT_AMBULATORY_CARE_PROVIDER_SITE_OTHER): Payer: Medicare Other | Admitting: Internal Medicine

## 2014-08-30 VITALS — BP 140/90 | HR 90 | Temp 97.4°F | Wt 151.0 lb

## 2014-08-30 DIAGNOSIS — F4321 Adjustment disorder with depressed mood: Secondary | ICD-10-CM | POA: Diagnosis not present

## 2014-08-30 DIAGNOSIS — R5382 Chronic fatigue, unspecified: Secondary | ICD-10-CM | POA: Diagnosis not present

## 2014-08-30 DIAGNOSIS — F411 Generalized anxiety disorder: Secondary | ICD-10-CM

## 2014-08-30 DIAGNOSIS — R5383 Other fatigue: Secondary | ICD-10-CM | POA: Insufficient documentation

## 2014-08-30 DIAGNOSIS — R202 Paresthesia of skin: Secondary | ICD-10-CM

## 2014-08-30 DIAGNOSIS — E871 Hypo-osmolality and hyponatremia: Secondary | ICD-10-CM

## 2014-08-30 MED ORDER — PAROXETINE HCL 10 MG PO TABS: 10.0000 mg | ORAL_TABLET | Freq: Every day | ORAL | Status: DC

## 2014-08-30 NOTE — Assessment & Plan Note (Signed)
Restart Paxil

## 2014-08-30 NOTE — Assessment & Plan Note (Signed)
D/c HCTZ 

## 2014-08-30 NOTE — Progress Notes (Signed)
Pre visit review using our clinic review tool, if applicable. No additional management support is needed unless otherwise documented below in the visit note. 

## 2014-08-30 NOTE — Assessment & Plan Note (Signed)
Continue with current prescription therapy as reflected on the Med list.  

## 2014-08-30 NOTE — Progress Notes (Signed)
Subjective:   F/u ER visit on 08/28/14:  "The patient reports that she got up to use the bathroom last night and reached out for something to hold onto but missed period she reports she has 2 steps to get up into her bed and she ended up falling backwards and hitting her buttock and her side on her steps in her night table. She reports she has pain right at the coccyx bone but otherwise she states that she has been up and walking about and does not have other pain issues relating to her fall. Her son reports that she started to develop sinus symptoms on Saturday with a lot of nasal drainage. He reports when she gets a sinus infection and gets pretty bad. She has been taking Allegra and another over-the-counter medication to come back to her sinus symptoms. She has not had any fever or headache. There is been no cough. The patient reports that she has felt a little unsteady for several days. She mostly attributes that to her age. She denies any focal area of weakness or numbness. She reports her balance has just been a little off." Na was 129  Pt has not been taking Paroxetine for ?reason  Back Pain Pertinent negatives include no abdominal pain, dysuria, headaches or weakness.   F/u stress w/sick husband: Ron is at the [facility] in [place]. Ron is still demanding a lot of Gwen's attention and time...  The patient is here to follow up on chronic LBP,  depression, anxiety, headaches and chronic moderate fibromyalgia symptoms controlled with medicines, diet and exercise. Her dizzy spell has resolved.   Wt Readings from Last 3 Encounters:  08/30/14 151 lb (68.493 kg)  08/28/14 145 lb (65.772 kg)  06/12/14 152 lb 1.9 oz (69.001 kg)   BP Readings from Last 3 Encounters:  08/30/14 140/90  08/28/14 120/77  06/12/14 110/72       Review of Systems  Constitutional: Negative for chills, activity change, appetite change, fatigue and unexpected weight change.  HENT: Negative for congestion, mouth  sores, sinus pressure and voice change.   Eyes: Negative for visual disturbance.  Respiratory: Negative for cough and chest tightness.   Gastrointestinal: Negative for nausea and abdominal pain.  Genitourinary: Negative for dysuria, frequency, difficulty urinating and vaginal pain.  Musculoskeletal: Positive for back pain. Negative for gait problem.  Skin: Negative for pallor and rash.  Neurological: Negative for dizziness, tremors, weakness and headaches.  Psychiatric/Behavioral: Negative for suicidal ideas, confusion, sleep disturbance and agitation. The patient is nervous/anxious.        Objective:   Physical Exam  Constitutional: She appears well-developed. No distress.  HENT:  Head: Normocephalic.  Right Ear: External ear normal.  Left Ear: External ear normal.  Nose: Nose normal.  Mouth/Throat: Oropharynx is clear and moist.  Eyes: Conjunctivae are normal. Pupils are equal, round, and reactive to light. Right eye exhibits no discharge. Left eye exhibits no discharge.  Neck: Normal range of motion. Neck supple. No JVD present. No tracheal deviation present. No thyromegaly present.  Cardiovascular: Normal rate, regular rhythm and normal heart sounds.   Pulmonary/Chest: No stridor. No respiratory distress. She has no wheezes.  Abdominal: Soft. Bowel sounds are normal. She exhibits no distension and no mass. There is no tenderness. There is no rebound and no guarding.  Musculoskeletal: She exhibits no edema or tenderness.  Lymphadenopathy:    She has no cervical adenopathy.  Neurological: She displays normal reflexes. No cranial nerve deficit. She exhibits normal  muscle tone. Coordination normal.  Skin: No rash noted. No erythema.  Psychiatric: She has a normal mood and affect. Judgment and thought content normal.     Lab Results  Component Value Date   WBC 5.0 08/28/2014   HGB 11.9* 08/28/2014   HCT 35.3* 08/28/2014   PLT 164 08/28/2014   GLUCOSE 87 08/28/2014   CHOL 174  09/11/2013   TRIG 80.0 09/11/2013   HDL 82.20 09/11/2013   LDLDIRECT 64.8 09/23/2008   LDLCALC 76 09/11/2013   ALT 13 03/12/2014   AST 18 03/12/2014   NA 129* 08/28/2014   K 3.4* 08/28/2014   CL 97 08/28/2014   CREATININE 0.69 08/28/2014   BUN 12 08/28/2014   CO2 27 08/28/2014   TSH 2.14 03/12/2014   INR 1.0 01/17/2008    A complex case >40 min     Assessment & Plan:

## 2014-08-30 NOTE — Telephone Encounter (Signed)
Check status on PA on cover-my-meds med has been approved. Notified pt & pharmacy with approval status...Pamela Callahan

## 2014-08-31 ENCOUNTER — Encounter: Payer: Self-pay | Admitting: Internal Medicine

## 2014-09-05 ENCOUNTER — Inpatient Hospital Stay: Payer: Medicare Other | Admitting: Internal Medicine

## 2014-09-11 ENCOUNTER — Other Ambulatory Visit (INDEPENDENT_AMBULATORY_CARE_PROVIDER_SITE_OTHER): Payer: Medicare Other

## 2014-09-11 DIAGNOSIS — R202 Paresthesia of skin: Secondary | ICD-10-CM

## 2014-09-11 DIAGNOSIS — F4321 Adjustment disorder with depressed mood: Secondary | ICD-10-CM | POA: Diagnosis not present

## 2014-09-11 DIAGNOSIS — E871 Hypo-osmolality and hyponatremia: Secondary | ICD-10-CM | POA: Diagnosis not present

## 2014-09-11 DIAGNOSIS — R5382 Chronic fatigue, unspecified: Secondary | ICD-10-CM | POA: Diagnosis not present

## 2014-09-11 DIAGNOSIS — F411 Generalized anxiety disorder: Secondary | ICD-10-CM

## 2014-09-11 LAB — HEPATIC FUNCTION PANEL
ALBUMIN: 3.9 g/dL (ref 3.5–5.2)
ALT: 12 U/L (ref 0–35)
AST: 14 U/L (ref 0–37)
Alkaline Phosphatase: 60 U/L (ref 39–117)
BILIRUBIN TOTAL: 0.9 mg/dL (ref 0.2–1.2)
Bilirubin, Direct: 0.2 mg/dL (ref 0.0–0.3)
TOTAL PROTEIN: 6.5 g/dL (ref 6.0–8.3)

## 2014-09-11 LAB — BASIC METABOLIC PANEL
BUN: 14 mg/dL (ref 6–23)
CO2: 30 mEq/L (ref 19–32)
Calcium: 9.3 mg/dL (ref 8.4–10.5)
Chloride: 106 mEq/L (ref 96–112)
Creatinine, Ser: 0.78 mg/dL (ref 0.40–1.20)
GFR: 74.85 mL/min (ref >60.00)
Glucose, Bld: 98 mg/dL (ref 70–99)
Potassium: 4 mEq/L (ref 3.5–5.1)
Sodium: 141 mEq/L (ref 135–145)

## 2014-09-11 LAB — VITAMIN B12: Vitamin B-12: 184 pg/mL — ABNORMAL LOW (ref 211–911)

## 2014-09-11 LAB — TSH: TSH: 1.97 u[IU]/mL (ref 0.35–4.50)

## 2014-09-12 ENCOUNTER — Other Ambulatory Visit: Payer: Self-pay | Admitting: Internal Medicine

## 2014-09-12 MED ORDER — VITAMIN B-12 1000 MCG SL SUBL: 1.0000 | SUBLINGUAL_TABLET | Freq: Every day | SUBLINGUAL | Status: DC

## 2014-09-17 ENCOUNTER — Encounter: Payer: Self-pay | Admitting: Internal Medicine

## 2014-09-17 ENCOUNTER — Ambulatory Visit (INDEPENDENT_AMBULATORY_CARE_PROVIDER_SITE_OTHER): Payer: Medicare Other | Admitting: Internal Medicine

## 2014-09-17 VITALS — BP 110/82 | HR 81 | Wt 151.0 lb

## 2014-09-17 DIAGNOSIS — I10 Essential (primary) hypertension: Secondary | ICD-10-CM

## 2014-09-17 DIAGNOSIS — F411 Generalized anxiety disorder: Secondary | ICD-10-CM | POA: Diagnosis not present

## 2014-09-17 DIAGNOSIS — E538 Deficiency of other specified B group vitamins: Secondary | ICD-10-CM | POA: Insufficient documentation

## 2014-09-17 MED ORDER — CYANOCOBALAMIN 1000 MCG/ML IJ SOLN
1000.0000 ug | Freq: Once | INTRAMUSCULAR | Status: AC
  Administered 2014-09-17: 1000 ug via INTRAMUSCULAR

## 2014-09-17 NOTE — Progress Notes (Signed)
Pre visit review using our clinic review tool, if applicable. No additional management support is needed unless otherwise documented below in the visit note. 

## 2014-09-17 NOTE — Assessment & Plan Note (Signed)
Continue with current prescription therapy as reflected on the Med list.  

## 2014-09-17 NOTE — Assessment & Plan Note (Signed)
On Paroxetine

## 2014-09-17 NOTE — Progress Notes (Signed)
Subjective:   F/u B12 def, hyponatremia, depression  F/u ER visit on 08/28/14:  "The patient reports that she got up to use the bathroom last night and reached out for something to hold onto but missed period she reports she has 2 steps to get up into her bed and she ended up falling backwards and hitting her buttock and her side on her steps in her night table. She reports she has pain right at the coccyx bone but otherwise she states that she has been up and walking about and does not have other pain issues relating to her fall. Her son reports that she started to develop sinus symptoms on Saturday with a lot of nasal drainage. He reports when she gets a sinus infection and gets pretty bad. She has been taking Allegra and another over-the-counter medication to come back to her sinus symptoms. She has not had any fever or headache. There is been no cough. The patient reports that she has felt a little unsteady for several days. She mostly attributes that to her age. She denies any focal area of weakness or numbness. She reports her balance has just been a little off." Na was 129    Back Pain Pertinent negatives include no abdominal pain, dysuria, headaches or weakness.   F/u stress w/sick husband: Ron is at the [facility] in [place]. Ron is still demanding a lot of Gwen's attention and time...  The patient is here to follow up on chronic LBP,  depression, anxiety, headaches and chronic moderate fibromyalgia symptoms controlled with medicines, diet and exercise. Her dizzy spell has resolved.   Wt Readings from Last 3 Encounters:  09/17/14 151 lb (68.493 kg)  08/30/14 151 lb (68.493 kg)  08/28/14 145 lb (65.772 kg)   BP Readings from Last 3 Encounters:  09/17/14 310/82  08/30/14 140/90  08/28/14 120/77       Review of Systems  Constitutional: Negative for chills, activity change, appetite change, fatigue and unexpected weight change.  HENT: Negative for congestion, mouth sores, sinus  pressure and voice change.   Eyes: Negative for visual disturbance.  Respiratory: Negative for cough and chest tightness.   Gastrointestinal: Negative for nausea and abdominal pain.  Genitourinary: Negative for dysuria, frequency, difficulty urinating and vaginal pain.  Musculoskeletal: Positive for back pain. Negative for gait problem.  Skin: Negative for pallor and rash.  Neurological: Negative for dizziness, tremors, weakness and headaches.  Psychiatric/Behavioral: Negative for suicidal ideas, confusion, sleep disturbance and agitation. The patient is nervous/anxious.        Objective:   Physical Exam  Constitutional: She appears well-developed. No distress.  HENT:  Head: Normocephalic.  Right Ear: External ear normal.  Left Ear: External ear normal.  Nose: Nose normal.  Mouth/Throat: Oropharynx is clear and moist.  Eyes: Conjunctivae are normal. Pupils are equal, round, and reactive to light. Right eye exhibits no discharge. Left eye exhibits no discharge.  Neck: Normal range of motion. Neck supple. No JVD present. No tracheal deviation present. No thyromegaly present.  Cardiovascular: Normal rate, regular rhythm and normal heart sounds.   Pulmonary/Chest: No stridor. No respiratory distress. She has no wheezes.  Abdominal: Soft. Bowel sounds are normal. She exhibits no distension and no mass. There is no tenderness. There is no rebound and no guarding.  Musculoskeletal: She exhibits no edema or tenderness.  Lymphadenopathy:    She has no cervical adenopathy.  Neurological: She displays normal reflexes. No cranial nerve deficit. She exhibits normal muscle tone. Coordination  normal.  Skin: No rash noted. No erythema.  Psychiatric: She has a normal mood and affect. Judgment and thought content normal.     Lab Results  Component Value Date   WBC 5.0 08/28/2014   HGB 11.9* 08/28/2014   HCT 35.3* 08/28/2014   PLT 164 08/28/2014   GLUCOSE 98 09/11/2014   CHOL 174 09/11/2013    TRIG 80.0 09/11/2013   HDL 82.20 09/11/2013   LDLDIRECT 64.8 09/23/2008   LDLCALC 76 09/11/2013   ALT 12 09/11/2014   AST 14 09/11/2014   NA 141 09/11/2014   K 4.0 09/11/2014   CL 106 09/11/2014   CREATININE 0.78 09/11/2014   BUN 14 09/11/2014   CO2 30 09/11/2014   TSH 1.97 09/11/2014   INR 1.0 01/17/2008        Assessment & Plan:

## 2014-09-17 NOTE — Assessment & Plan Note (Signed)
B12 SL started B12 IM 1000 mcg/d

## 2014-09-17 NOTE — Assessment & Plan Note (Signed)
Better  

## 2014-10-10 DIAGNOSIS — Z9889 Other specified postprocedural states: Secondary | ICD-10-CM | POA: Diagnosis not present

## 2014-10-10 DIAGNOSIS — H3531 Nonexudative age-related macular degeneration: Secondary | ICD-10-CM | POA: Diagnosis not present

## 2014-10-10 DIAGNOSIS — Z961 Presence of intraocular lens: Secondary | ICD-10-CM | POA: Diagnosis not present

## 2014-10-10 DIAGNOSIS — H3532 Exudative age-related macular degeneration: Secondary | ICD-10-CM | POA: Diagnosis not present

## 2014-11-06 ENCOUNTER — Other Ambulatory Visit: Payer: Self-pay | Admitting: Internal Medicine

## 2014-12-12 DIAGNOSIS — H3532 Exudative age-related macular degeneration: Secondary | ICD-10-CM | POA: Diagnosis not present

## 2014-12-16 ENCOUNTER — Encounter: Payer: Self-pay | Admitting: Internal Medicine

## 2014-12-16 ENCOUNTER — Ambulatory Visit (INDEPENDENT_AMBULATORY_CARE_PROVIDER_SITE_OTHER): Payer: Medicare Other | Admitting: Internal Medicine

## 2014-12-16 VITALS — BP 130/88 | HR 63 | Wt 151.0 lb

## 2014-12-16 DIAGNOSIS — I491 Atrial premature depolarization: Secondary | ICD-10-CM

## 2014-12-16 DIAGNOSIS — I10 Essential (primary) hypertension: Secondary | ICD-10-CM | POA: Diagnosis not present

## 2014-12-16 DIAGNOSIS — E538 Deficiency of other specified B group vitamins: Secondary | ICD-10-CM

## 2014-12-16 DIAGNOSIS — F4321 Adjustment disorder with depressed mood: Secondary | ICD-10-CM

## 2014-12-16 DIAGNOSIS — E785 Hyperlipidemia, unspecified: Secondary | ICD-10-CM

## 2014-12-16 MED ORDER — CYANOCOBALAMIN 1000 MCG/ML IJ SOLN: 1000.0000 ug | INTRAMUSCULAR | Status: DC

## 2014-12-16 MED ORDER — CYANOCOBALAMIN 1000 MCG/ML IJ SOLN
1000.0000 ug | Freq: Once | INTRAMUSCULAR | Status: AC
  Administered 2014-12-16: 1000 ug via INTRAMUSCULAR

## 2014-12-16 MED ORDER — "SYRINGE/NEEDLE (DISP) 30G X 1/2"" 1 ML MISC": Status: DC

## 2014-12-16 NOTE — Assessment & Plan Note (Signed)
On Paxil 

## 2014-12-16 NOTE — Assessment & Plan Note (Signed)
Chronic - controlled w/Rx On Benicar

## 2014-12-16 NOTE — Progress Notes (Signed)
   Subjective:    F/u stress w/sick husband: Ron is at the [facility] in [place]. Ron is still demanding a lot of Gwen's attention and time.Marland KitchenMarland KitchenHe was in the hospital at [facility] x 3 wks w/PNA.  The patient is here to follow up on chronic LBP, B12 def,  depression, anxiety, headaches and chronic moderate fibromyalgia symptoms controlled with medicines, diet and exercise. Her dizzy spell has resolved, feeling better.    Back Pain This is a chronic problem. Pertinent negatives include no abdominal pain, dysuria, headaches or weakness.     Wt Readings from Last 3 Encounters:  12/16/14 151 lb (68.493 kg)  09/17/14 151 lb (68.493 kg)  08/30/14 151 lb (68.493 kg)   BP Readings from Last 3 Encounters:  12/16/14 130/88  09/17/14 110/82  08/30/14 140/90     Review of Systems  Constitutional: Negative for chills, activity change, appetite change, fatigue and unexpected weight change.  HENT: Negative for congestion, mouth sores, sinus pressure and voice change.   Eyes: Negative for visual disturbance.  Respiratory: Negative for cough and chest tightness.   Gastrointestinal: Negative for nausea and abdominal pain.  Genitourinary: Negative for dysuria, frequency, difficulty urinating and vaginal pain.  Musculoskeletal: Positive for back pain. Negative for gait problem.  Skin: Negative for pallor and rash.  Neurological: Negative for dizziness, tremors, weakness and headaches.  Psychiatric/Behavioral: Negative for suicidal ideas, confusion, sleep disturbance and agitation. The patient is nervous/anxious.        Objective:   Physical Exam  Constitutional: She appears well-developed. No distress.  HENT:  Head: Normocephalic.  Right Ear: External ear normal.  Left Ear: External ear normal.  Nose: Nose normal.  Mouth/Throat: Oropharynx is clear and moist.  Eyes: Conjunctivae are normal. Pupils are equal, round, and reactive to light. Right eye exhibits no discharge. Left eye exhibits no discharge.   Neck: Normal range of motion. Neck supple. No JVD present. No tracheal deviation present. No thyromegaly present.  Cardiovascular: Normal rate, regular rhythm and normal heart sounds.   Pulmonary/Chest: No stridor. No respiratory distress. She has no wheezes.  Abdominal: Soft. Bowel sounds are normal. She exhibits no distension and no mass. There is no tenderness. There is no rebound and no guarding.  Musculoskeletal: She exhibits no edema or tenderness.  Lymphadenopathy:    She has no cervical adenopathy.  Neurological: She displays normal reflexes. No cranial nerve deficit. She exhibits normal muscle tone. Coordination normal.  Skin: No rash noted. No erythema.  Psychiatric: She has a normal mood and affect. Judgment and thought content normal.     Lab Results  Component Value Date   WBC 5.0 08/28/2014   HGB 11.9* 08/28/2014   HCT 35.3* 08/28/2014   PLT 164 08/28/2014   GLUCOSE 98 09/11/2014   CHOL 174 09/11/2013   TRIG 80.0 09/11/2013   HDL 82.20 09/11/2013   LDLDIRECT 64.8 09/23/2008   LDLCALC 76 09/11/2013   ALT 12 09/11/2014   AST 14 09/11/2014   NA 141 09/11/2014   K 4.0 09/11/2014   CL 106 09/11/2014   CREATININE 0.78 09/11/2014   BUN 14 09/11/2014   CO2 30 09/11/2014   TSH 1.97 09/11/2014   INR 1.0 01/17/2008        Assessment & Plan:

## 2014-12-16 NOTE — Progress Notes (Signed)
Pre visit review using our clinic review tool, if applicable. No additional management support is needed unless otherwise documented below in the visit note. 

## 2014-12-16 NOTE — Assessment & Plan Note (Signed)
Change to sq Vit B12 5/16

## 2014-12-16 NOTE — Assessment & Plan Note (Signed)
On Zocor 

## 2014-12-16 NOTE — Assessment & Plan Note (Signed)
In NSR 

## 2015-01-27 ENCOUNTER — Other Ambulatory Visit: Payer: Self-pay

## 2015-02-20 DIAGNOSIS — Z9889 Other specified postprocedural states: Secondary | ICD-10-CM | POA: Diagnosis not present

## 2015-02-20 DIAGNOSIS — H3532 Exudative age-related macular degeneration: Secondary | ICD-10-CM | POA: Diagnosis not present

## 2015-02-20 DIAGNOSIS — H3531 Nonexudative age-related macular degeneration: Secondary | ICD-10-CM | POA: Diagnosis not present

## 2015-02-20 DIAGNOSIS — Z961 Presence of intraocular lens: Secondary | ICD-10-CM | POA: Diagnosis not present

## 2015-04-10 ENCOUNTER — Other Ambulatory Visit (INDEPENDENT_AMBULATORY_CARE_PROVIDER_SITE_OTHER): Payer: Medicare Other

## 2015-04-10 DIAGNOSIS — E538 Deficiency of other specified B group vitamins: Secondary | ICD-10-CM

## 2015-04-10 DIAGNOSIS — I10 Essential (primary) hypertension: Secondary | ICD-10-CM | POA: Diagnosis not present

## 2015-04-10 LAB — BASIC METABOLIC PANEL
BUN: 12 mg/dL (ref 6–23)
CO2: 29 mEq/L (ref 19–32)
Calcium: 9.2 mg/dL (ref 8.4–10.5)
Chloride: 106 mEq/L (ref 96–112)
Creatinine, Ser: 0.72 mg/dL (ref 0.40–1.20)
GFR: 81.98 mL/min (ref >60.00)
Glucose, Bld: 88 mg/dL (ref 70–99)
Potassium: 4.3 mEq/L (ref 3.5–5.1)
SODIUM: 142 meq/L (ref 135–145)

## 2015-04-10 LAB — VITAMIN B12: Vitamin B-12: 612 pg/mL (ref 211–911)

## 2015-04-22 ENCOUNTER — Encounter: Payer: Self-pay | Admitting: Internal Medicine

## 2015-04-22 ENCOUNTER — Ambulatory Visit (INDEPENDENT_AMBULATORY_CARE_PROVIDER_SITE_OTHER): Payer: Medicare Other | Admitting: Internal Medicine

## 2015-04-22 VITALS — BP 130/88 | HR 66 | Wt 154.0 lb

## 2015-04-22 DIAGNOSIS — F411 Generalized anxiety disorder: Secondary | ICD-10-CM

## 2015-04-22 DIAGNOSIS — E538 Deficiency of other specified B group vitamins: Secondary | ICD-10-CM | POA: Diagnosis not present

## 2015-04-22 DIAGNOSIS — M545 Low back pain, unspecified: Secondary | ICD-10-CM

## 2015-04-22 DIAGNOSIS — E785 Hyperlipidemia, unspecified: Secondary | ICD-10-CM

## 2015-04-22 DIAGNOSIS — I10 Essential (primary) hypertension: Secondary | ICD-10-CM

## 2015-04-22 DIAGNOSIS — Z23 Encounter for immunization: Secondary | ICD-10-CM | POA: Diagnosis not present

## 2015-04-22 MED ORDER — OLMESARTAN MEDOXOMIL 20 MG PO TABS: 20.0000 mg | ORAL_TABLET | Freq: Every day | ORAL | Status: DC

## 2015-04-22 NOTE — Assessment & Plan Note (Signed)
On B12 

## 2015-04-22 NOTE — Assessment & Plan Note (Signed)
On Benicar 

## 2015-04-22 NOTE — Assessment & Plan Note (Signed)
On Zocor 

## 2015-04-22 NOTE — Patient Instructions (Signed)
MegaRed krill oil

## 2015-04-22 NOTE — Assessment & Plan Note (Signed)
Doing fair lately MegaRed krill oil

## 2015-04-22 NOTE — Assessment & Plan Note (Signed)
Chronic Paxil Tranzene prn  Potential benefits of a long term benzodiazepines  use as well as potential risks  and complications were explained to the patient and were aknowledged.

## 2015-04-22 NOTE — Progress Notes (Signed)
Subjective:  Patient ID: Pamela Callahan, female    DOB: [birth year]  Age: 81 y.o. MRN: [id number]  CC: No chief complaint on file.   HPI Pamela Callahan presents for depression, OA/LBP, HTN, B12 def f/u  Outpatient Prescriptions Prior to Visit  Medication Sig Dispense Refill  . aspirin 81 MG tablet Take 81 mg by mouth daily.      . Cholecalciferol (VITAMIN D3) 1000 UNITS CAPS Take by mouth.      . cyanocobalamin (,VITAMIN B-12,) 1000 MCG/ML injection Inject 1 mL (1,000 mcg total) into the skin every 14 (fourteen) days. 10 mL 6  . Cyanocobalamin (VITAMIN B-12) 1000 MCG SUBL Place 1 tablet (1,000 mcg total) under the tongue daily. 100 tablet 3  . fexofenadine (ALLEGRA) 180 MG tablet Take 1 tablet (180 mg total) by mouth daily. 90 tablet 2  . fluticasone (FLONASE) 50 MCG/ACT nasal spray Place 1 spray into both nostrils daily. As needed 16 g 11  . Magnesium Gluconate 550 MG TABS Take by mouth daily.      . Multiple Vitamins-Minerals (PRESERVISION/LUTEIN) CAPS Take by mouth 2 (two) times daily.      Marland Kitchen olmesartan (BENICAR) 20 MG tablet Take 1 tablet (20 mg total) by mouth daily. 90 tablet 3  . PARoxetine (PAXIL) 10 MG tablet Take 1 tablet (10 mg total) by mouth daily. 90 tablet 3  . prednisoLONE acetate (PRED FORTE) 1 % ophthalmic suspension Place 1 drop into the left eye 4 (four) times daily.    . simvastatin (ZOCOR) 10 MG tablet Take 1 tablet (10 mg total) by mouth at bedtime. 90 tablet 3  . Syringe/Needle, Disp, (B-D ECLIPSE SYRINGE) 30G X 1/2" 1 ML MISC As directed 50 each 1  . triamcinolone cream (KENALOG) 0.5 % Apply topically 2 (two) times daily. 45 g 0  . clorazepate (TRANXENE) 7.5 MG tablet Take 2 tablets (15 mg total) by mouth 3 (three) times daily as needed for anxiety or sleep. (Patient not taking: Reported on 04/22/2015) 270 tablet 1   No facility-administered medications prior to visit.    ROS Review of Systems  Constitutional: Negative for chills, activity change, appetite  change, fatigue and unexpected weight change.  HENT: Negative for congestion, mouth sores and sinus pressure.   Eyes: Negative for visual disturbance.  Respiratory: Negative for cough and chest tightness.   Gastrointestinal: Negative for nausea and abdominal pain.  Genitourinary: Negative for frequency, difficulty urinating and vaginal pain.  Musculoskeletal: Negative for back pain and gait problem.  Skin: Negative for pallor and rash.  Neurological: Negative for dizziness, tremors, weakness, numbness and headaches.  Psychiatric/Behavioral: Negative for suicidal ideas, confusion and sleep disturbance.    Objective:  BP 130/88 mmHg  Pulse 66  Wt 154 lb (69.854 kg)  SpO2 96%  BP Readings from Last 3 Encounters:  04/22/15 130/88  12/16/14 130/88  09/17/14 110/82    Wt Readings from Last 3 Encounters:  04/22/15 154 lb (69.854 kg)  12/16/14 151 lb (68.493 kg)  09/17/14 151 lb (68.493 kg)    Physical Exam  Constitutional: She appears well-developed. No distress.  HENT:  Head: Normocephalic.  Right Ear: External ear normal.  Left Ear: External ear normal.  Nose: Nose normal.  Mouth/Throat: Oropharynx is clear and moist.  Eyes: Conjunctivae are normal. Pupils are equal, round, and reactive to light. Right eye exhibits no discharge. Left eye exhibits no discharge.  Neck: Normal range of motion. Neck supple. No JVD present. No tracheal deviation present. No  thyromegaly present.  Cardiovascular: Normal rate, regular rhythm and normal heart sounds.   Pulmonary/Chest: No stridor. No respiratory distress. She has no wheezes.  Abdominal: Soft. Bowel sounds are normal. She exhibits no distension and no mass. There is no tenderness. There is no rebound and no guarding.  Musculoskeletal: She exhibits no edema or tenderness.  Lymphadenopathy:    She has no cervical adenopathy.  Neurological: She displays normal reflexes. No cranial nerve deficit. She exhibits normal muscle tone.  Coordination abnormal.  Skin: No rash noted. No erythema.  Psychiatric: She has a normal mood and affect. Her behavior is normal. Judgment and thought content normal.  pt looks better  Lab Results  Component Value Date   WBC 5.0 08/28/2014   HGB 11.9* 08/28/2014   HCT 35.3* 08/28/2014   PLT 164 08/28/2014   GLUCOSE 88 04/10/2015   CHOL 174 09/11/2013   TRIG 80.0 09/11/2013   HDL 82.20 09/11/2013   LDLDIRECT 64.8 09/23/2008   LDLCALC 76 09/11/2013   ALT 12 09/11/2014   AST 14 09/11/2014   NA 142 04/10/2015   K 4.3 04/10/2015   CL 106 04/10/2015   CREATININE 0.72 04/10/2015   BUN 12 04/10/2015   CO2 29 04/10/2015   TSH 1.97 09/11/2014   INR 1.0 01/17/2008    Dg Sacrum/coccyx  08/28/2014   CLINICAL DATA:  81 year old female who fell onto buttocks 3 days ago with pain. Initial encounter.  EXAM: SACRUM AND COCCYX - 2+ VIEW  COMPARISON:  Pelvis x-rays 01/23/2008 and earlier.  FINDINGS: Chronic bilateral hip arthroplasties partially visible. Bone mineralization is within normal limits for age. Bilateral pubic rami appear stable and intact. SI joints within normal limits. Sacral segments appear to remain intact. Coccygeal segments within normal limits. No definite sacral ala fracture. The L5 level appears intact.  IMPRESSION: No acute fracture or dislocation identified about the sacrum or coccyx.   Electronically Signed   By: Lars Pinks M.D.   On: 08/28/2014 09:41    Assessment & Plan:   Diagnoses and all orders for this visit:  Need for influenza vaccination -     Flu Vaccine QUAD 36+ mos IM  Essential hypertension  B12 deficiency  Anxiety state  Other orders -     olmesartan (BENICAR) 20 MG tablet; Take 1 tablet (20 mg total) by mouth daily.   I am having Ms. Klee maintain her Vitamin D3, PRESERVISION/LUTEIN, Magnesium Gluconate, aspirin, fexofenadine, prednisoLONE acetate, fluticasone, clorazepate, olmesartan, simvastatin, triamcinolone cream, PARoxetine, Vitamin B-12,  cyanocobalamin, and Syringe/Needle (Disp).  No orders of the defined types were placed in this encounter.     Follow-up: No Follow-up on file.  Walker Kehr, MD

## 2015-04-24 DIAGNOSIS — H3531 Nonexudative age-related macular degeneration: Secondary | ICD-10-CM | POA: Diagnosis not present

## 2015-04-24 DIAGNOSIS — H3532 Exudative age-related macular degeneration: Secondary | ICD-10-CM | POA: Diagnosis not present

## 2015-04-24 DIAGNOSIS — Z961 Presence of intraocular lens: Secondary | ICD-10-CM | POA: Diagnosis not present

## 2015-04-24 DIAGNOSIS — Z9889 Other specified postprocedural states: Secondary | ICD-10-CM | POA: Diagnosis not present

## 2015-05-19 ENCOUNTER — Telehealth: Payer: Self-pay | Admitting: Internal Medicine

## 2015-06-10 ENCOUNTER — Other Ambulatory Visit: Payer: Self-pay | Admitting: Internal Medicine

## 2015-06-11 ENCOUNTER — Other Ambulatory Visit: Payer: Self-pay | Admitting: Internal Medicine

## 2015-06-11 NOTE — Telephone Encounter (Signed)
Done

## 2015-07-01 ENCOUNTER — Other Ambulatory Visit (INDEPENDENT_AMBULATORY_CARE_PROVIDER_SITE_OTHER): Payer: Medicare Other

## 2015-07-01 ENCOUNTER — Encounter: Payer: Self-pay | Admitting: Internal Medicine

## 2015-07-01 ENCOUNTER — Ambulatory Visit (INDEPENDENT_AMBULATORY_CARE_PROVIDER_SITE_OTHER): Payer: Medicare Other | Admitting: Internal Medicine

## 2015-07-01 DIAGNOSIS — R5382 Chronic fatigue, unspecified: Secondary | ICD-10-CM

## 2015-07-01 DIAGNOSIS — F05 Delirium due to known physiological condition: Secondary | ICD-10-CM

## 2015-07-01 DIAGNOSIS — R29898 Other symptoms and signs involving the musculoskeletal system: Secondary | ICD-10-CM | POA: Diagnosis not present

## 2015-07-01 DIAGNOSIS — E538 Deficiency of other specified B group vitamins: Secondary | ICD-10-CM | POA: Diagnosis not present

## 2015-07-01 DIAGNOSIS — R410 Disorientation, unspecified: Secondary | ICD-10-CM

## 2015-07-01 DIAGNOSIS — E785 Hyperlipidemia, unspecified: Secondary | ICD-10-CM

## 2015-07-01 LAB — HEPATIC FUNCTION PANEL
ALT: 12 U/L (ref 0–35)
AST: 17 U/L (ref 0–37)
Albumin: 4.1 g/dL (ref 3.5–5.2)
Alkaline Phosphatase: 81 U/L (ref 39–117)
BILIRUBIN DIRECT: 0.2 mg/dL (ref 0.0–0.3)
Total Bilirubin: 0.8 mg/dL (ref 0.2–1.2)
Total Protein: 7.1 g/dL (ref 6.0–8.3)

## 2015-07-01 LAB — URINALYSIS
Bilirubin Urine: NEGATIVE
Hgb urine dipstick: NEGATIVE
Ketones, ur: NEGATIVE
Leukocytes, UA: NEGATIVE
Nitrite: NEGATIVE
SPECIFIC GRAVITY, URINE: 1.02 (ref 1.000–1.030)
Total Protein, Urine: NEGATIVE
URINE GLUCOSE: NEGATIVE
Urobilinogen, UA: 0.2 (ref 0.0–1.0)
pH: 6 (ref 5.0–8.0)

## 2015-07-01 LAB — CBC WITH DIFFERENTIAL/PLATELET
Basophils Absolute: 0 10*3/uL (ref 0.0–0.1)
Basophils Relative: 0.6 % (ref 0.0–3.0)
Eosinophils Absolute: 0.2 10*3/uL (ref 0.0–0.7)
Eosinophils Relative: 2.7 % (ref 0.0–5.0)
HCT: 39.2 % (ref 36.0–46.0)
HEMOGLOBIN: 13 g/dL (ref 12.0–15.0)
Lymphocytes Relative: 26.9 % (ref 12.0–46.0)
Lymphs Abs: 1.6 10*3/uL (ref 0.7–4.0)
MCHC: 33.2 g/dL (ref 30.0–36.0)
MCV: 92.3 fl (ref 78.0–100.0)
Monocytes Absolute: 0.7 10*3/uL (ref 0.1–1.0)
Monocytes Relative: 10.9 % (ref 3.0–12.0)
Neutro Abs: 3.5 10*3/uL (ref 1.4–7.7)
Neutrophils Relative %: 58.9 % (ref 43.0–77.0)
Platelets: 216 10*3/uL (ref 150.0–400.0)
RBC: 4.25 Mil/uL (ref 3.87–5.11)
RDW: 13.7 % (ref 11.5–15.5)
WBC: 6 10*3/uL (ref 4.0–10.5)

## 2015-07-01 LAB — BASIC METABOLIC PANEL
BUN: 13 mg/dL (ref 6–23)
CALCIUM: 9.5 mg/dL (ref 8.4–10.5)
CO2: 31 mEq/L (ref 19–32)
Chloride: 104 mEq/L (ref 96–112)
Creatinine, Ser: 0.81 mg/dL (ref 0.40–1.20)
GFR: 71.52 mL/min (ref >60.00)
Glucose, Bld: 81 mg/dL (ref 70–99)
Potassium: 4.4 mEq/L (ref 3.5–5.1)
SODIUM: 141 meq/L (ref 135–145)

## 2015-07-01 LAB — TSH: TSH: 3.71 u[IU]/mL (ref 0.35–4.50)

## 2015-07-01 LAB — SEDIMENTATION RATE: Sed Rate: 26 mm/hr — ABNORMAL HIGH (ref 0–22)

## 2015-07-01 LAB — CK: Total CK: 113 U/L (ref 7–177)

## 2015-07-01 MED ORDER — CLORAZEPATE DIPOTASSIUM 7.5 MG PO TABS: 3.7500 mg | ORAL_TABLET | Freq: Two times a day (BID) | ORAL | Status: DC | PRN

## 2015-07-01 MED ORDER — PAROXETINE HCL 10 MG PO TABS: ORAL_TABLET | ORAL | Status: DC

## 2015-07-01 NOTE — Progress Notes (Signed)
Subjective:  Patient ID: Pamela Callahan, female    DOB: 1929/06/16  Age: 81 y.o. MRN: [id number]  CC: No chief complaint on file.   HPI Pamela Callahan presents for fatigue, sleeping a lot during the day x 1 month, forgetful, hard time finding words and speech difficulties at times. No falls. Sx's have been fluctuating. C/o weak llegs. The pt did not stop Zocor yet.  Outpatient Prescriptions Prior to Visit  Medication Sig Dispense Refill  . aspirin 81 MG tablet Take 81 mg by mouth daily.      . Cholecalciferol (VITAMIN D3) 1000 UNITS CAPS Take by mouth.      . cyanocobalamin (,VITAMIN B-12,) 1000 MCG/ML injection Inject 1 mL (1,000 mcg total) into the skin every 14 (fourteen) days. 10 mL 6  . Cyanocobalamin (VITAMIN B-12) 1000 MCG SUBL Place 1 tablet (1,000 mcg total) under the tongue daily. 100 tablet 3  . fexofenadine (ALLEGRA) 180 MG tablet Take 1 tablet (180 mg total) by mouth daily. 90 tablet 2  . fluticasone (FLONASE) 50 MCG/ACT nasal spray Place 1 spray into both nostrils daily. As needed 16 g 11  . Magnesium Gluconate 550 MG TABS Take by mouth daily.      . Multiple Vitamins-Minerals (PRESERVISION/LUTEIN) CAPS Take by mouth 2 (two) times daily.      Marland Kitchen olmesartan (BENICAR) 20 MG tablet Take 1 tablet (20 mg total) by mouth daily. 90 tablet 3  . prednisoLONE acetate (PRED FORTE) 1 % ophthalmic suspension Place 1 drop into the left eye 4 (four) times daily.    . simvastatin (ZOCOR) 10 MG tablet Take 1 tablet (10 mg total) by mouth at bedtime. 90 tablet 3  . Syringe/Needle, Disp, (B-D ECLIPSE SYRINGE) 30G X 1/2" 1 ML MISC As directed 50 each 1  . triamcinolone cream (KENALOG) 0.5 % Apply topically 2 (two) times daily. 45 g 0  . clorazepate (TRANXENE) 7.5 MG tablet TAKE 2 TABLETS 3 TIMES A DAY AS NEEDED FOR ANXIETY OR SLEEP 270 tablet 2  . PARoxetine (PAXIL) 10 MG tablet Take 1 tablet (10 mg total) by mouth daily. 90 tablet 3   No facility-administered medications prior to visit.     ROS Review of Systems  Constitutional: Positive for fatigue. Negative for chills, activity change, appetite change and unexpected weight change.  HENT: Negative for congestion, mouth sores and sinus pressure.   Eyes: Negative for visual disturbance.  Respiratory: Negative for cough and chest tightness.   Gastrointestinal: Negative for nausea, abdominal pain and diarrhea.  Genitourinary: Negative for frequency, difficulty urinating and vaginal pain.  Musculoskeletal: Positive for gait problem. Negative for back pain.  Skin: Negative for pallor, rash and wound.  Neurological: Negative for dizziness, tremors, weakness, numbness and headaches.  Psychiatric/Behavioral: Positive for decreased concentration. Negative for hallucinations, confusion and sleep disturbance. The patient is nervous/anxious.     Objective:  There were no vitals taken for this visit.  BP Readings from Last 3 Encounters:  04/22/15 130/88  12/16/14 130/88  09/17/14 110/82    Wt Readings from Last 3 Encounters:  04/22/15 154 lb (69.854 kg)  12/16/14 151 lb (68.493 kg)  09/17/14 151 lb (68.493 kg)    Physical Exam  Constitutional: She appears well-developed. No distress.  HENT:  Head: Normocephalic.  Right Ear: External ear normal.  Left Ear: External ear normal.  Nose: Nose normal.  Mouth/Throat: Oropharynx is clear and moist.  Eyes: Conjunctivae are normal. Pupils are equal, round, and reactive to light. Right  eye exhibits no discharge. Left eye exhibits no discharge.  Neck: Normal range of motion. Neck supple. No JVD present. No tracheal deviation present. No thyromegaly present.  Cardiovascular: Normal rate, regular rhythm and normal heart sounds.   Pulmonary/Chest: No stridor. No respiratory distress. She has no wheezes.  Abdominal: Soft. Bowel sounds are normal. She exhibits no distension and no mass. There is no tenderness. There is no rebound and no guarding.  Musculoskeletal: She exhibits no  edema or tenderness.  Lymphadenopathy:    She has no cervical adenopathy.  Neurological: She displays normal reflexes. No cranial nerve deficit. She exhibits normal muscle tone. Coordination normal.  Skin: No rash noted. No erythema.  Psychiatric: She has a normal mood and affect. Her behavior is normal. Judgment and thought content normal.    Lab Results  Component Value Date   WBC 6.0 07/01/2015   HGB 13.0 07/01/2015   HCT 39.2 07/01/2015   PLT 216.0 07/01/2015   GLUCOSE 81 07/01/2015   CHOL 174 09/11/2013   TRIG 80.0 09/11/2013   HDL 82.20 09/11/2013   LDLDIRECT 64.8 09/23/2008   LDLCALC 76 09/11/2013   ALT 12 07/01/2015   AST 17 07/01/2015   NA 141 07/01/2015   K 4.4 07/01/2015   CL 104 07/01/2015   CREATININE 0.81 07/01/2015   BUN 13 07/01/2015   CO2 31 07/01/2015   TSH 3.71 07/01/2015   INR 1.0 01/17/2008    Dg Sacrum/coccyx  08/28/2014  CLINICAL DATA:  81 year old female who fell onto buttocks 3 days ago with pain. Initial encounter. EXAM: SACRUM AND COCCYX - 2+ VIEW COMPARISON:  Pelvis x-rays 01/23/2008 and earlier. FINDINGS: Chronic bilateral hip arthroplasties partially visible. Bone mineralization is within normal limits for age. Bilateral pubic rami appear stable and intact. SI joints within normal limits. Sacral segments appear to remain intact. Coccygeal segments within normal limits. No definite sacral ala fracture. The L5 level appears intact. IMPRESSION: No acute fracture or dislocation identified about the sacrum or coccyx. Electronically Signed   By: Lars Pinks M.D.   On: 08/28/2014 09:41    Assessment & Plan:   Diagnoses and all orders for this visit:  B12 deficiency -     Basic metabolic panel; Future -     CBC with Differential/Platelet; Future -     Hepatic function panel; Future -     Urinalysis; Future -     TSH; Future -     Sedimentation rate; Future -     CK; Future  Chronic fatigue -     Basic metabolic panel; Future -     CBC with  Differential/Platelet; Future -     Hepatic function panel; Future -     Urinalysis; Future -     TSH; Future -     Sedimentation rate; Future -     CK; Future  Dyslipidemia -     Basic metabolic panel; Future -     CBC with Differential/Platelet; Future -     Hepatic function panel; Future -     Urinalysis; Future -     TSH; Future -     Sedimentation rate; Future -     CK; Future  Other orders -     PARoxetine (PAXIL) 10 MG tablet; 5 mg at night -     clorazepate (TRANXENE) 7.5 MG tablet; Take 0.5-1 tablets (3.75-7.5 mg total) by mouth 2 (two) times daily as needed for anxiety or sleep.  I have changed Ms. Gikas's PARoxetine  and clorazepate. I am also having her maintain her Vitamin D3, PRESERVISION/LUTEIN, Magnesium Gluconate, aspirin, fexofenadine, prednisoLONE acetate, fluticasone, simvastatin, triamcinolone cream, Vitamin B-12, cyanocobalamin, Syringe/Needle (Disp), and olmesartan.  Meds ordered this encounter  Medications  . PARoxetine (PAXIL) 10 MG tablet    Sig: 5 mg at night    Dispense:  90 tablet    Refill:  3  . clorazepate (TRANXENE) 7.5 MG tablet    Sig: Take 0.5-1 tablets (3.75-7.5 mg total) by mouth 2 (two) times daily as needed for anxiety or sleep.    Dispense:  180 tablet    Refill:  2    This request is for a new prescription for a controlled substance as required by Federal/State law..     Follow-up: No Follow-up on file.  Walker Kehr, MD

## 2015-07-01 NOTE — Assessment & Plan Note (Signed)
Hold Zocor 

## 2015-07-01 NOTE — Assessment & Plan Note (Signed)
Check B12 Pt thinks shots make her sick - we can try SL tabs

## 2015-07-01 NOTE — Assessment & Plan Note (Signed)
  Poss med side effects related related R/o infection, ie UTI,anemia etc Mild cognitive deficit

## 2015-07-01 NOTE — Patient Instructions (Signed)
Paxil 1/2 tablet at night Count  Hold Simvastatin Reduce Tranxene to 1/2 tab 2 times a day as needed

## 2015-07-01 NOTE — Assessment & Plan Note (Signed)
Labs Paxil 1/2 tablet at night Count  Hold Simvastatin Reduce Tranxene to 1/2 tab 2 times a day as needed

## 2015-07-01 NOTE — Progress Notes (Signed)
Pre visit review using our clinic review tool, if applicable. No additional management support is needed unless otherwise documented below in the visit note. 

## 2015-07-01 NOTE — Assessment & Plan Note (Signed)
  Hold Simvastatin Labs

## 2015-07-03 ENCOUNTER — Telehealth: Payer: Self-pay | Admitting: Internal Medicine

## 2015-07-03 NOTE — Telephone Encounter (Signed)
Patients son called regarding plan for B12. The idea was to test b12 and determine a plan of care for the b12 shots. It appears that no b12 test was done on 11/29... The question is how should they proceed, because the patient does not desire to get the injections. She is scheduled to get a b12 today, but lack direction. Please call ronald to update as to how they should proceed.

## 2015-07-03 NOTE — Telephone Encounter (Signed)
Take Vit B12 SL tabs. Check Vit B12 level in 6-8 wks. Thx

## 2015-07-04 NOTE — Telephone Encounter (Signed)
b12 SL daily?

## 2015-07-04 NOTE — Telephone Encounter (Signed)
Pt's son informed to have her take Vit B12 1000 mcg qd.

## 2015-07-04 NOTE — Telephone Encounter (Signed)
Pt's son called back and he is also wondering how many mgs

## 2015-07-10 DIAGNOSIS — Z9889 Other specified postprocedural states: Secondary | ICD-10-CM | POA: Diagnosis not present

## 2015-07-10 DIAGNOSIS — H353222 Exudative age-related macular degeneration, left eye, with inactive choroidal neovascularization: Secondary | ICD-10-CM | POA: Diagnosis not present

## 2015-07-10 DIAGNOSIS — Z961 Presence of intraocular lens: Secondary | ICD-10-CM | POA: Diagnosis not present

## 2015-07-10 DIAGNOSIS — H353112 Nonexudative age-related macular degeneration, right eye, intermediate dry stage: Secondary | ICD-10-CM | POA: Diagnosis not present

## 2015-07-10 DIAGNOSIS — H353290 Exudative age-related macular degeneration, unspecified eye, stage unspecified: Secondary | ICD-10-CM | POA: Diagnosis not present

## 2015-07-18 DIAGNOSIS — L570 Actinic keratosis: Secondary | ICD-10-CM | POA: Diagnosis not present

## 2015-07-18 DIAGNOSIS — L814 Other melanin hyperpigmentation: Secondary | ICD-10-CM | POA: Diagnosis not present

## 2015-07-18 DIAGNOSIS — C44622 Squamous cell carcinoma of skin of right upper limb, including shoulder: Secondary | ICD-10-CM | POA: Diagnosis not present

## 2015-07-22 NOTE — Telephone Encounter (Signed)
No note needed 

## 2015-08-06 ENCOUNTER — Ambulatory Visit (INDEPENDENT_AMBULATORY_CARE_PROVIDER_SITE_OTHER): Payer: Medicare Other | Admitting: Internal Medicine

## 2015-08-06 ENCOUNTER — Encounter: Payer: Self-pay | Admitting: Internal Medicine

## 2015-08-06 VITALS — BP 116/70 | HR 94 | Temp 99.8°F | Ht 69.0 in | Wt 159.0 lb

## 2015-08-06 DIAGNOSIS — R410 Disorientation, unspecified: Secondary | ICD-10-CM

## 2015-08-06 DIAGNOSIS — F05 Delirium due to known physiological condition: Secondary | ICD-10-CM

## 2015-08-06 DIAGNOSIS — I10 Essential (primary) hypertension: Secondary | ICD-10-CM

## 2015-08-06 DIAGNOSIS — R05 Cough: Secondary | ICD-10-CM

## 2015-08-06 DIAGNOSIS — R059 Cough, unspecified: Secondary | ICD-10-CM

## 2015-08-06 MED ORDER — LEVOFLOXACIN 250 MG PO TABS: 250.0000 mg | ORAL_TABLET | Freq: Every day | ORAL | Status: DC

## 2015-08-06 MED ORDER — HYDROCODONE-HOMATROPINE 5-1.5 MG/5ML PO SYRP: 5.0000 mL | ORAL_SOLUTION | Freq: Four times a day (QID) | ORAL | Status: DC | PRN

## 2015-08-06 NOTE — Patient Instructions (Addendum)
Please take all new medication as prescribed - the antibiotic, and cough medicine if needed  Please continue all other medications as before, and refills have been done if requested.  Please have the pharmacy call with any other refills you may need.  Please keep your appointments with your specialists as you may have planned     

## 2015-08-06 NOTE — Progress Notes (Signed)
Subjective:    Patient ID: Pamela Callahan, female    DOB: Feb 07, 1929, 82 y.o.   MRN: [id number]  HPI  Here with acute onset mild to mod 2-3 days ST, HA, general weakness and malaise, with prod cough greenish sputum, but Pt denies chest pain, increased sob or doe, wheezing, orthopnea, PND, increased LE swelling, palpitations, dizziness or syncope.  Here with son.  Pt denies new neurological symptoms such as new headache, or facial or extremity weakness or numbness   Pt denies polydipsia, polyuria, Past Medical History  Diagnosis Date  . Depression   . Hypertension   . Anxiety   . GERD (gastroesophageal reflux disease)   . Allergy   . Hyperlipidemia   . Osteopenia   . Low back pain   . PAC (premature atrial contraction)   . Diverticulitis    Past Surgical History  Procedure Laterality Date  . Cholecystectomy  2006  . Total hip arthroplasty  2008    Right    reports that she has never smoked. She has never used smokeless tobacco. She reports that she uses illicit drugs. She reports that she does not drink alcohol. family history includes Hypertension in her mother and other. Allergies  Allergen Reactions  . Ace Inhibitors     REACTION: cough  . Penicillins    Current Outpatient Prescriptions on File Prior to Visit  Medication Sig Dispense Refill  . aspirin 81 MG tablet Take 81 mg by mouth daily.      . Cholecalciferol (VITAMIN D3) 1000 UNITS CAPS Take by mouth.      . clorazepate (TRANXENE) 7.5 MG tablet Take 0.5-1 tablets (3.75-7.5 mg total) by mouth 2 (two) times daily as needed for anxiety or sleep. 180 tablet 2  . cyanocobalamin (,VITAMIN B-12,) 1000 MCG/ML injection Inject 1 mL (1,000 mcg total) into the skin every 14 (fourteen) days. 10 mL 6  . Cyanocobalamin (VITAMIN B-12) 1000 MCG SUBL Place 1 tablet (1,000 mcg total) under the tongue daily. 100 tablet 3  . fexofenadine (ALLEGRA) 180 MG tablet Take 1 tablet (180 mg total) by mouth daily. 90 tablet 2  . fluticasone  (FLONASE) 50 MCG/ACT nasal spray Place 1 spray into both nostrils daily. As needed 16 g 11  . Magnesium Gluconate 550 MG TABS Take by mouth daily.      . Multiple Vitamins-Minerals (PRESERVISION/LUTEIN) CAPS Take by mouth 2 (two) times daily.      Marland Kitchen olmesartan (BENICAR) 20 MG tablet Take 1 tablet (20 mg total) by mouth daily. 90 tablet 3  . PARoxetine (PAXIL) 10 MG tablet 5 mg at night 90 tablet 3  . prednisoLONE acetate (PRED FORTE) 1 % ophthalmic suspension Place 1 drop into the left eye 4 (four) times daily.    . simvastatin (ZOCOR) 10 MG tablet Take 1 tablet (10 mg total) by mouth at bedtime. 90 tablet 3  . Syringe/Needle, Disp, (B-D ECLIPSE SYRINGE) 30G X 1/2" 1 ML MISC As directed 50 each 1  . triamcinolone cream (KENALOG) 0.5 % Apply topically 2 (two) times daily. 45 g 0   No current facility-administered medications on file prior to visit.   Review of Systems  Constitutional: Negative for unusual diaphoresis or night sweats HENT: Negative for ringing in ear or discharge Eyes: Negative for double vision or worsening visual disturbance.  Respiratory: Negative for choking and stridor.   Gastrointestinal: Negative for vomiting or other signifcant bowel change Genitourinary: Negative for hematuria or change in urine volume.  Musculoskeletal: Negative  for other MSK pain or swelling Skin: Negative for color change and worsening wound.  Neurological: Negative for tremors and numbness other than noted  Psychiatric/Behavioral: Negative for decreased concentration or agitation other than above       Objective:   Physical Exam BP 116/70 mmHg  Pulse 94  Temp(Src) 99.8 F (37.7 C) (Oral)  Ht 5\' 9"  (1.753 m)  Wt 159 lb (72.122 kg)  BMI 23.47 kg/m2  SpO2 81% VS noted, mild ill Constitutional: Pt appears in no significant distress HENT: Head: NCAT.  Right Ear: External ear normal.  Left Ear: External ear normal.  Eyes: . Pupils are equal, round, and reactive to light. Conjunctivae and  EOM are normal Bilat tm's with mild erythema.  Max sinus areas non tender.  Pharynx with mild erythema, no exudate Neck: Normal range of motion. Neck supple.  Cardiovascular: Normal rate and regular rhythm.   Pulmonary/Chest: Effort normal and breath sounds somewhat decreased without rales or wheezing.  Neurological: Pt is alert. Not confused , motor grossly intact Skin: Skin is warm. No rash, no LE edema Psychiatric: Pt behavior is normal. No agitation.     Assessment & Plan:

## 2015-08-06 NOTE — Progress Notes (Signed)
Pre visit review using our clinic review tool, if applicable. No additional management support is needed unless otherwise documented below in the visit note. 

## 2015-08-07 NOTE — Assessment & Plan Note (Signed)
Remains some confused today, at baseline per son, consider dementia worsening

## 2015-08-07 NOTE — Assessment & Plan Note (Signed)
stable overall by history and exam, recent data reviewed with pt, and pt to continue medical treatment as before,  to f/u any worsening symptoms or concerns BP Readings from Last 3 Encounters:  08/06/15 116/70  04/22/15 130/88  12/16/14 130/88

## 2015-08-07 NOTE — Assessment & Plan Note (Signed)
Mild to mod, c/w bronchitis vs pna, declines cxr, for antibx course, to f/u any worsening symptoms or concerns  

## 2015-08-17 ENCOUNTER — Other Ambulatory Visit: Payer: Self-pay | Admitting: Internal Medicine

## 2015-08-26 ENCOUNTER — Encounter: Payer: Self-pay | Admitting: Internal Medicine

## 2015-08-26 ENCOUNTER — Other Ambulatory Visit (INDEPENDENT_AMBULATORY_CARE_PROVIDER_SITE_OTHER): Payer: Medicare Other

## 2015-08-26 ENCOUNTER — Ambulatory Visit (INDEPENDENT_AMBULATORY_CARE_PROVIDER_SITE_OTHER): Payer: Medicare Other | Admitting: Internal Medicine

## 2015-08-26 VITALS — BP 148/90 | HR 71 | Wt 154.0 lb

## 2015-08-26 DIAGNOSIS — I10 Essential (primary) hypertension: Secondary | ICD-10-CM | POA: Diagnosis not present

## 2015-08-26 DIAGNOSIS — E538 Deficiency of other specified B group vitamins: Secondary | ICD-10-CM

## 2015-08-26 DIAGNOSIS — G8929 Other chronic pain: Secondary | ICD-10-CM

## 2015-08-26 DIAGNOSIS — M545 Low back pain: Secondary | ICD-10-CM

## 2015-08-26 DIAGNOSIS — F4321 Adjustment disorder with depressed mood: Secondary | ICD-10-CM

## 2015-08-26 LAB — BASIC METABOLIC PANEL
BUN: 12 mg/dL (ref 6–23)
CALCIUM: 9.5 mg/dL (ref 8.4–10.5)
CO2: 29 meq/L (ref 19–32)
CREATININE: 0.73 mg/dL (ref 0.40–1.20)
Chloride: 106 mEq/L (ref 96–112)
GFR: 80.61 mL/min (ref >60.00)
Glucose, Bld: 97 mg/dL (ref 70–99)
Potassium: 4.1 mEq/L (ref 3.5–5.1)
SODIUM: 143 meq/L (ref 135–145)

## 2015-08-26 LAB — CBC WITH DIFFERENTIAL/PLATELET
BASOS PCT: 0.7 % (ref 0.0–3.0)
Basophils Absolute: 0 10*3/uL (ref 0.0–0.1)
EOS PCT: 1.5 % (ref 0.0–5.0)
Eosinophils Absolute: 0.1 10*3/uL (ref 0.0–0.7)
HCT: 40 % (ref 36.0–46.0)
Hemoglobin: 13.2 g/dL (ref 12.0–15.0)
LYMPHS ABS: 1.4 10*3/uL (ref 0.7–4.0)
Lymphocytes Relative: 24 % (ref 12.0–46.0)
MCHC: 32.9 g/dL (ref 30.0–36.0)
MCV: 90.7 fl (ref 78.0–100.0)
MONO ABS: 0.5 10*3/uL (ref 0.1–1.0)
MONOS PCT: 8.4 % (ref 3.0–12.0)
NEUTROS ABS: 3.8 10*3/uL (ref 1.4–7.7)
NEUTROS PCT: 65.4 % (ref 43.0–77.0)
Platelets: 275 10*3/uL (ref 150.0–400.0)
RBC: 4.42 Mil/uL (ref 3.87–5.11)
RDW: 13.2 % (ref 11.5–15.5)
WBC: 5.9 10*3/uL (ref 4.0–10.5)

## 2015-08-26 LAB — VITAMIN B12: Vitamin B-12: 733 pg/mL (ref 211–911)

## 2015-08-26 NOTE — Assessment & Plan Note (Signed)
Chronic, recurrent 11/13 L sciatica Tylenol prn

## 2015-08-26 NOTE — Assessment & Plan Note (Signed)
On Benicar 

## 2015-08-26 NOTE — Assessment & Plan Note (Addendum)
1/17 Pamela Callahan passed 2 weeks ago - grieving Poss mild dementia

## 2015-08-26 NOTE — Progress Notes (Signed)
Subjective:  Patient ID: Pamela Callahan, female    DOB: Dec 22, 1928  Age: 82 y.o. MRN: [id number]  CC: No chief complaint on file.   HPI Pamela Callahan presents for fatigue, Vit B12 def, HTN f/up. Mr Tasso passed 2 weeks ago.  Outpatient Prescriptions Prior to Visit  Medication Sig Dispense Refill  . aspirin 81 MG tablet Take 81 mg by mouth daily.      . Cholecalciferol (VITAMIN D3) 1000 UNITS CAPS Take by mouth.      . clorazepate (TRANXENE) 7.5 MG tablet Take 0.5-1 tablets (3.75-7.5 mg total) by mouth 2 (two) times daily as needed for anxiety or sleep. 180 tablet 2  . Cyanocobalamin (VITAMIN B-12) 1000 MCG SUBL Place 1 tablet (1,000 mcg total) under the tongue daily. 100 tablet 3  . fexofenadine (ALLEGRA) 180 MG tablet Take 1 tablet (180 mg total) by mouth daily. 90 tablet 2  . fluticasone (FLONASE) 50 MCG/ACT nasal spray Place 1 spray into both nostrils daily. As needed 16 g 11  . Magnesium Gluconate 550 MG TABS Take by mouth daily.      . Multiple Vitamins-Minerals (PRESERVISION/LUTEIN) CAPS Take by mouth 2 (two) times daily.      Marland Kitchen olmesartan (BENICAR) 20 MG tablet Take 1 tablet (20 mg total) by mouth daily. 90 tablet 3  . PARoxetine (PAXIL) 10 MG tablet TAKE 1 TABLET (10 MG TOTAL) BY MOUTH DAILY. 90 tablet 3  . triamcinolone cream (KENALOG) 0.5 % Apply topically 2 (two) times daily. 45 g 0  . cyanocobalamin (,VITAMIN B-12,) 1000 MCG/ML injection Inject 1 mL (1,000 mcg total) into the skin every 14 (fourteen) days. (Patient not taking: Reported on 08/26/2015) 10 mL 6  . HYDROcodone-homatropine (HYCODAN) 5-1.5 MG/5ML syrup Take 5 mLs by mouth every 6 (six) hours as needed for cough. (Patient not taking: Reported on 08/26/2015) 180 mL 0  . prednisoLONE acetate (PRED FORTE) 1 % ophthalmic suspension Place 1 drop into the left eye 4 (four) times daily. Reported on 08/26/2015    . simvastatin (ZOCOR) 10 MG tablet Take 1 tablet (10 mg total) by mouth at bedtime. (Patient not taking: Reported  on 08/26/2015) 90 tablet 3  . Syringe/Needle, Disp, (B-D ECLIPSE SYRINGE) 30G X 1/2" 1 ML MISC As directed (Patient not taking: Reported on 08/26/2015) 50 each 1  . levofloxacin (LEVAQUIN) 250 MG tablet Take 1 tablet (250 mg total) by mouth daily. (Patient not taking: Reported on 08/26/2015) 10 tablet 0  . PARoxetine (PAXIL) 10 MG tablet 5 mg at night (Patient not taking: Reported on 08/26/2015) 90 tablet 3   No facility-administered medications prior to visit.    ROS Review of Systems  Constitutional: Negative for chills, activity change, appetite change, fatigue and unexpected weight change.  HENT: Negative for congestion, mouth sores and sinus pressure.   Eyes: Negative for visual disturbance.  Respiratory: Negative for cough and chest tightness.   Gastrointestinal: Negative for nausea and abdominal pain.  Genitourinary: Negative for frequency, difficulty urinating and vaginal pain.  Musculoskeletal: Negative for back pain and gait problem.  Skin: Negative for pallor and rash.  Neurological: Negative for dizziness, tremors, weakness, numbness and headaches.  Psychiatric/Behavioral: Positive for decreased concentration. Negative for confusion and sleep disturbance.    Objective:  BP 148/90 mmHg  Pulse 71  Wt 154 lb (69.854 kg)  SpO2 97%  BP Readings from Last 3 Encounters:  08/26/15 148/90  08/06/15 116/70  04/22/15 130/88    Wt Readings from Last 3 Encounters:  08/26/15 154 lb (69.854 kg)  08/06/15 159 lb (72.122 kg)  04/22/15 154 lb (69.854 kg)    Physical Exam  Constitutional: She appears well-developed. No distress.  HENT:  Head: Normocephalic.  Right Ear: External ear normal.  Left Ear: External ear normal.  Nose: Nose normal.  Mouth/Throat: Oropharynx is clear and moist.  Eyes: Conjunctivae are normal. Pupils are equal, round, and reactive to light. Right eye exhibits no discharge. Left eye exhibits no discharge.  Neck: Normal range of motion. Neck supple. No JVD  present. No tracheal deviation present. No thyromegaly present.  Cardiovascular: Normal rate, regular rhythm and normal heart sounds.   Pulmonary/Chest: No stridor. No respiratory distress. She has no wheezes.  Abdominal: Soft. Bowel sounds are normal. She exhibits no distension and no mass. There is no tenderness. There is no rebound and no guarding.  Musculoskeletal: She exhibits no edema or tenderness.  Lymphadenopathy:    She has no cervical adenopathy.  Neurological: She displays normal reflexes. No cranial nerve deficit. She exhibits normal muscle tone. Coordination normal.  Skin: No rash noted. No erythema.  Psychiatric: Her behavior is normal. Judgment and thought content normal.  Confused a little  Lab Results  Component Value Date   WBC 6.0 07/01/2015   HGB 13.0 07/01/2015   HCT 39.2 07/01/2015   PLT 216.0 07/01/2015   GLUCOSE 81 07/01/2015   CHOL 174 09/11/2013   TRIG 80.0 09/11/2013   HDL 82.20 09/11/2013   LDLDIRECT 64.8 09/23/2008   LDLCALC 76 09/11/2013   ALT 12 07/01/2015   AST 17 07/01/2015   NA 141 07/01/2015   K 4.4 07/01/2015   CL 104 07/01/2015   CREATININE 0.81 07/01/2015   BUN 13 07/01/2015   CO2 31 07/01/2015   TSH 3.71 07/01/2015   INR 1.0 01/17/2008    Dg Sacrum/coccyx  08/28/2014  CLINICAL DATA:  82 year old female who fell onto buttocks 3 days ago with pain. Initial encounter. EXAM: SACRUM AND COCCYX - 2+ VIEW COMPARISON:  Pelvis x-rays 01/23/2008 and earlier. FINDINGS: Chronic bilateral hip arthroplasties partially visible. Bone mineralization is within normal limits for age. Bilateral pubic rami appear stable and intact. SI joints within normal limits. Sacral segments appear to remain intact. Coccygeal segments within normal limits. No definite sacral ala fracture. The L5 level appears intact. IMPRESSION: No acute fracture or dislocation identified about the sacrum or coccyx. Electronically Signed   By: Lars Pinks M.D.   On: 08/28/2014 09:41     Assessment & Plan:   Diagnoses and all orders for this visit:  Essential hypertension  B12 deficiency  Chronic bilateral low back pain without sciatica  Situational depression  I have discontinued Ms. Cariker's levofloxacin. I am also having her maintain her Vitamin D3, PRESERVISION/LUTEIN, Magnesium Gluconate, aspirin, fexofenadine, prednisoLONE acetate, fluticasone, simvastatin, triamcinolone cream, Vitamin B-12, cyanocobalamin, Syringe/Needle (Disp), olmesartan, clorazepate, HYDROcodone-homatropine, and PARoxetine.  No orders of the defined types were placed in this encounter.     Follow-up: Return in about 4 months (around 12/24/2015) for a follow-up visit.  Walker Kehr, MD

## 2015-08-26 NOTE — Progress Notes (Signed)
Pre visit review using our clinic review tool, if applicable. No additional management support is needed unless otherwise documented below in the visit note. 

## 2015-08-26 NOTE — Assessment & Plan Note (Signed)
On B12 sq 

## 2015-09-22 DIAGNOSIS — Z85828 Personal history of other malignant neoplasm of skin: Secondary | ICD-10-CM | POA: Diagnosis not present

## 2015-09-22 DIAGNOSIS — Z08 Encounter for follow-up examination after completed treatment for malignant neoplasm: Secondary | ICD-10-CM | POA: Diagnosis not present

## 2015-09-22 DIAGNOSIS — L570 Actinic keratosis: Secondary | ICD-10-CM | POA: Diagnosis not present

## 2015-09-22 DIAGNOSIS — L821 Other seborrheic keratosis: Secondary | ICD-10-CM | POA: Diagnosis not present

## 2015-10-09 DIAGNOSIS — Z9889 Other specified postprocedural states: Secondary | ICD-10-CM | POA: Diagnosis not present

## 2015-10-09 DIAGNOSIS — Z961 Presence of intraocular lens: Secondary | ICD-10-CM | POA: Diagnosis not present

## 2015-10-09 DIAGNOSIS — H353222 Exudative age-related macular degeneration, left eye, with inactive choroidal neovascularization: Secondary | ICD-10-CM | POA: Diagnosis not present

## 2015-10-09 DIAGNOSIS — H353112 Nonexudative age-related macular degeneration, right eye, intermediate dry stage: Secondary | ICD-10-CM | POA: Diagnosis not present

## 2015-10-27 ENCOUNTER — Other Ambulatory Visit: Payer: Self-pay | Admitting: Internal Medicine

## 2015-10-28 ENCOUNTER — Other Ambulatory Visit: Payer: Self-pay | Admitting: Internal Medicine

## 2015-11-13 NOTE — Telephone Encounter (Signed)
Received call from Pamela Callahan/pharmacist stating recieved two different dosage on the Vit b12 wanting to know which dosage is correct. Inform Pamela Callahan per chart  Pt should be taking the 500 mg 2 tablets under tongue daily...Pamela Callahan

## 2015-11-13 NOTE — Addendum Note (Signed)
Addended by: Earnstine Regal on: 11/13/2015 03:01 PM   Modules accepted: Orders, Medications

## 2015-11-18 ENCOUNTER — Telehealth: Payer: Self-pay | Admitting: Internal Medicine

## 2015-11-18 NOTE — Telephone Encounter (Signed)
Patient states she is in the middle of a big wedding and is very edgy.  She states she is woozy.  She is requesting something to be sent to whatever pharmacy on file.  Patient states she believes pharmacy is in [place].  Patient states she woke up today like she had been killed and needs to get things together.  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  Patient states she does not want anything that Dr. Alain Marion has sent in for her already. Patient requested a mild tranquilizer.  I asked her what she meant by a mild tranquilizer and she did not remember requesting a tranquilizer.  Patient states she takes benadryl for her edginess.  I asked her "you take benadryl for edginess?"  She thought a moment and said she wasn't sure what she took but she knows that she takes benadryl.

## 2015-11-18 NOTE — Telephone Encounter (Signed)
Take Tranxene prn - she has it and has used it before w/good results as a tranquilizer. Thx

## 2015-11-19 NOTE — Telephone Encounter (Signed)
Pt's son informed

## 2015-12-25 ENCOUNTER — Encounter: Payer: Self-pay | Admitting: Internal Medicine

## 2015-12-25 ENCOUNTER — Ambulatory Visit (INDEPENDENT_AMBULATORY_CARE_PROVIDER_SITE_OTHER): Payer: Medicare Other | Admitting: Internal Medicine

## 2015-12-25 VITALS — BP 140/90 | HR 71 | Wt 156.0 lb

## 2015-12-25 DIAGNOSIS — E538 Deficiency of other specified B group vitamins: Secondary | ICD-10-CM | POA: Diagnosis not present

## 2015-12-25 DIAGNOSIS — I10 Essential (primary) hypertension: Secondary | ICD-10-CM | POA: Diagnosis not present

## 2015-12-25 DIAGNOSIS — M1712 Unilateral primary osteoarthritis, left knee: Secondary | ICD-10-CM

## 2015-12-25 DIAGNOSIS — H9113 Presbycusis, bilateral: Secondary | ICD-10-CM | POA: Diagnosis not present

## 2015-12-25 DIAGNOSIS — H9110 Presbycusis, unspecified ear: Secondary | ICD-10-CM | POA: Insufficient documentation

## 2015-12-25 DIAGNOSIS — F411 Generalized anxiety disorder: Secondary | ICD-10-CM

## 2015-12-25 MED ORDER — METHYLPREDNISOLONE ACETATE 80 MG/ML IJ SUSP
80.0000 mg | Freq: Once | INTRAMUSCULAR | Status: AC
  Administered 2015-12-25: 80 mg via INTRA_ARTICULAR

## 2015-12-25 MED ORDER — VITAMIN B-12 1000 MCG PO TABS: 1000.0000 ug | ORAL_TABLET | Freq: Every day | ORAL | Status: AC

## 2015-12-25 MED ORDER — CYANOCOBALAMIN 1000 MCG/ML IJ SOLN
1000.0000 ug | Freq: Once | INTRAMUSCULAR | Status: AC
  Administered 2015-12-25: 1000 ug via INTRAMUSCULAR

## 2015-12-25 NOTE — Assessment & Plan Note (Signed)
Paxil Doing fair

## 2015-12-25 NOTE — Assessment & Plan Note (Signed)
On B12 po Shots of B12 when she is here

## 2015-12-25 NOTE — Assessment & Plan Note (Signed)
B hearing aids F/u w/Audiology

## 2015-12-25 NOTE — Assessment & Plan Note (Signed)
On Benicar 

## 2015-12-25 NOTE — Progress Notes (Signed)
Subjective:  Patient ID: Pamela Callahan, female    DOB: March 20, 1929  Age: 82 y.o. MRN: [id number]  CC: No chief complaint on file.   HPI Pamela Callahan presents for L>R knee pain. C/o anxiety, decreased focus  Outpatient Prescriptions Prior to Visit  Medication Sig Dispense Refill  . aspirin 81 MG tablet Take 81 mg by mouth daily.      . Cholecalciferol (VITAMIN D3) 1000 UNITS CAPS Take by mouth.      . clorazepate (TRANXENE) 7.5 MG tablet Take 0.5-1 tablets (3.75-7.5 mg total) by mouth 2 (two) times daily as needed for anxiety or sleep. 180 tablet 2  . fexofenadine (ALLEGRA) 180 MG tablet Take 1 tablet (180 mg total) by mouth daily. 90 tablet 2  . fluticasone (FLONASE) 50 MCG/ACT nasal spray Place 1 spray into both nostrils daily. As needed 16 g 11  . HYDROcodone-homatropine (HYCODAN) 5-1.5 MG/5ML syrup Take 5 mLs by mouth every 6 (six) hours as needed for cough. 180 mL 0  . Magnesium Gluconate 550 MG TABS Take by mouth daily.      . Multiple Vitamins-Minerals (PRESERVISION/LUTEIN) CAPS Take by mouth 2 (two) times daily.      Marland Kitchen olmesartan (BENICAR) 20 MG tablet Take 1 tablet (20 mg total) by mouth daily. 90 tablet 3  . PARoxetine (PAXIL) 10 MG tablet TAKE 1 TABLET (10 MG TOTAL) BY MOUTH DAILY. 90 tablet 3  . prednisoLONE acetate (PRED FORTE) 1 % ophthalmic suspension Place 1 drop into the left eye 4 (four) times daily. Reported on 08/26/2015    . simvastatin (ZOCOR) 10 MG tablet Take 1 tablet (10 mg total) by mouth at bedtime. 90 tablet 3  . Syringe/Needle, Disp, (B-D ECLIPSE SYRINGE) 30G X 1/2" 1 ML MISC As directed 50 each 1  . triamcinolone cream (KENALOG) 0.5 % Apply topically 2 (two) times daily. 45 g 0  . [employer] B-12 500 MCG SUBL PLACE 2 TABLETS UNDER THE TONGUE DAILY. 100 each 1   No facility-administered medications prior to visit.    ROS Review of Systems  Constitutional: Positive for fatigue. Negative for chills, activity change, appetite change and unexpected weight change.    HENT: Negative for congestion, mouth sores and sinus pressure.   Eyes: Negative for visual disturbance.  Respiratory: Negative for cough and chest tightness.   Gastrointestinal: Negative for nausea and abdominal pain.  Genitourinary: Negative for frequency, difficulty urinating and vaginal pain.  Musculoskeletal: Negative for back pain and gait problem.  Skin: Negative for pallor and rash.  Neurological: Negative for dizziness, tremors, weakness, numbness and headaches.  Psychiatric/Behavioral: Positive for sleep disturbance and decreased concentration. Negative for suicidal ideas, confusion and dysphoric mood. The patient is nervous/anxious.     Objective:  BP 140/90 mmHg  Pulse 71  Wt 156 lb (70.761 kg)  SpO2 97%  BP Readings from Last 3 Encounters:  12/25/15 140/90  08/26/15 148/90  08/06/15 116/70    Wt Readings from Last 3 Encounters:  12/25/15 156 lb (70.761 kg)  08/26/15 154 lb (69.854 kg)  08/06/15 159 lb (72.122 kg)    Physical Exam  Constitutional: She appears well-developed. No distress.  HENT:  Head: Normocephalic.  Right Ear: External ear normal.  Left Ear: External ear normal.  Nose: Nose normal.  Mouth/Throat: Oropharynx is clear and moist.  Eyes: Conjunctivae are normal. Pupils are equal, round, and reactive to light. Right eye exhibits no discharge. Left eye exhibits no discharge.  Neck: Normal range of motion. Neck supple.  No JVD present. No tracheal deviation present. No thyromegaly present.  Cardiovascular: Normal rate, regular rhythm and normal heart sounds.   Pulmonary/Chest: No stridor. No respiratory distress. She has no wheezes.  Abdominal: Soft. Bowel sounds are normal. She exhibits no distension and no mass. There is no tenderness. There is no rebound and no guarding.  Musculoskeletal: She exhibits tenderness. She exhibits no edema.  Lymphadenopathy:    She has no cervical adenopathy.  Neurological: She displays normal reflexes. No cranial  nerve deficit. She exhibits normal muscle tone. Coordination abnormal.  Skin: No rash noted. No erythema.  Psychiatric: She has a normal mood and affect. Her behavior is normal. Judgment and thought content normal.  hard hearing Cane L>> R knee is tender  Lab Results  Component Value Date   WBC 5.9 08/26/2015   HGB 13.2 08/26/2015   HCT 40.0 08/26/2015   PLT 275.0 08/26/2015   GLUCOSE 97 08/26/2015   CHOL 174 09/11/2013   TRIG 80.0 09/11/2013   HDL 82.20 09/11/2013   LDLDIRECT 64.8 09/23/2008   LDLCALC 76 09/11/2013   ALT 12 07/01/2015   AST 17 07/01/2015   NA 143 08/26/2015   K 4.1 08/26/2015   CL 106 08/26/2015   CREATININE 0.73 08/26/2015   BUN 12 08/26/2015   CO2 29 08/26/2015   TSH 3.71 07/01/2015   INR 1.0 01/17/2008    Dg Sacrum/coccyx  08/28/2014  CLINICAL DATA:  82 year old female who fell onto buttocks 3 days ago with pain. Initial encounter. EXAM: SACRUM AND COCCYX - 2+ VIEW COMPARISON:  Pelvis x-rays 01/23/2008 and earlier. FINDINGS: Chronic bilateral hip arthroplasties partially visible. Bone mineralization is within normal limits for age. Bilateral pubic rami appear stable and intact. SI joints within normal limits. Sacral segments appear to remain intact. Coccygeal segments within normal limits. No definite sacral ala fracture. The L5 level appears intact. IMPRESSION: No acute fracture or dislocation identified about the sacrum or coccyx. Electronically Signed   By: Lars Pinks M.D.   On: 08/28/2014 09:41     Procedure Note :     Procedure : Joint Injection,  L knee   Indication:  Joint osteoarthritis with refractory  chronic pain.   Risks including unsuccessful procedure , bleeding, infection, bruising, skin atrophy, "steroid flare-up" and others were explained to the patient in detail as well as the benefits. Informed consent was obtained and signed.   Tthe patient was placed in a comfortable position. Lateral approach was used. Skin was prepped with Betadine  and alcohol  and anesthetized a cooling spray. Then, a 5 cc syringe with a 1.5 inch long 25-gauge needle was used for a joint injection.. The needle was advanced  Into the knee joint cavity. I aspirated a small amount of intra-articular fluid to confirm correct placement of the needle and injected the joint with 5 mL of 2% lidocaine and 80 mg of Depo-Medrol .  Band-Aid was applied.   Tolerated well. Complications: None. Good pain relief following the procedure.    Assessment & Plan:   There are no diagnoses linked to this encounter. I have discontinued Ms. Ashby's CVS B-12. I am also having her maintain her Vitamin D3, PRESERVISION/LUTEIN, Magnesium Gluconate, aspirin, fexofenadine, prednisoLONE acetate, fluticasone, simvastatin, triamcinolone cream, Syringe/Needle (Disp), olmesartan, clorazepate, HYDROcodone-homatropine, PARoxetine, and Vitamin B-12.  Meds ordered this encounter  Medications  . Cyanocobalamin (VITAMIN B-12) 1000 MCG SUBL    Sig: Place 1 tablet under the tongue daily.    Refill:  1  Follow-up: No Follow-up on file.  Walker Kehr, MD

## 2015-12-25 NOTE — Patient Instructions (Signed)
Postprocedure instructions :    A Band-Aid should be left on for 12 hours. Injection therapy is not a cure itself. It is used in conjunction with other modalities. You can use nonsteroidal anti-inflammatories like ibuprofen , hot and cold compresses. Rest is recommended in the next 24 hours. You need to report immediately  if fever, chills or any signs of infection develop. 

## 2015-12-25 NOTE — Addendum Note (Signed)
Addended by: Cresenciano Lick on: 12/25/2015 11:11 AM   Modules accepted: Orders

## 2015-12-25 NOTE — Progress Notes (Signed)
Pre visit review using our clinic review tool, if applicable. No additional management support is needed unless otherwise documented below in the visit note. 

## 2015-12-25 NOTE — Assessment & Plan Note (Signed)
Worse Options discussed Will inject w/Depo

## 2015-12-31 DIAGNOSIS — M25562 Pain in left knee: Secondary | ICD-10-CM | POA: Diagnosis not present

## 2016-01-03 DIAGNOSIS — M25562 Pain in left knee: Secondary | ICD-10-CM | POA: Diagnosis not present

## 2016-01-08 DIAGNOSIS — M25562 Pain in left knee: Secondary | ICD-10-CM | POA: Diagnosis not present

## 2016-01-08 DIAGNOSIS — M23201 Derangement of unspecified lateral meniscus due to old tear or injury, left knee: Secondary | ICD-10-CM | POA: Diagnosis not present

## 2016-01-08 DIAGNOSIS — M1712 Unilateral primary osteoarthritis, left knee: Secondary | ICD-10-CM | POA: Diagnosis not present

## 2016-03-04 DIAGNOSIS — M23201 Derangement of unspecified lateral meniscus due to old tear or injury, left knee: Secondary | ICD-10-CM | POA: Diagnosis not present

## 2016-03-04 DIAGNOSIS — M1712 Unilateral primary osteoarthritis, left knee: Secondary | ICD-10-CM | POA: Diagnosis not present

## 2016-03-04 DIAGNOSIS — M25562 Pain in left knee: Secondary | ICD-10-CM | POA: Diagnosis not present

## 2016-03-05 ENCOUNTER — Telehealth: Payer: Self-pay | Admitting: Internal Medicine

## 2016-03-05 NOTE — Telephone Encounter (Signed)
States mom has upcoming appointment would like to give Erline Levine some information to give to Plot before the appt.

## 2016-03-08 NOTE — Telephone Encounter (Signed)
I called pt's son- He states patient has "been off" with her memory. He feels that something is going on with her because she is repeating herself and talking about things that aren't true or factual and is hallucinating. He states he will be with patient on 03/26/16 for OV with PCP.

## 2016-03-18 DIAGNOSIS — Z9889 Other specified postprocedural states: Secondary | ICD-10-CM | POA: Diagnosis not present

## 2016-03-18 DIAGNOSIS — H353112 Nonexudative age-related macular degeneration, right eye, intermediate dry stage: Secondary | ICD-10-CM | POA: Diagnosis not present

## 2016-03-18 DIAGNOSIS — Z961 Presence of intraocular lens: Secondary | ICD-10-CM | POA: Diagnosis not present

## 2016-03-18 DIAGNOSIS — H353222 Exudative age-related macular degeneration, left eye, with inactive choroidal neovascularization: Secondary | ICD-10-CM | POA: Diagnosis not present

## 2016-03-18 DIAGNOSIS — M1712 Unilateral primary osteoarthritis, left knee: Secondary | ICD-10-CM | POA: Diagnosis not present

## 2016-03-25 DIAGNOSIS — M1712 Unilateral primary osteoarthritis, left knee: Secondary | ICD-10-CM | POA: Diagnosis not present

## 2016-03-26 ENCOUNTER — Encounter: Payer: Self-pay | Admitting: Internal Medicine

## 2016-03-26 ENCOUNTER — Ambulatory Visit (INDEPENDENT_AMBULATORY_CARE_PROVIDER_SITE_OTHER): Payer: Medicare Other | Admitting: Internal Medicine

## 2016-03-26 ENCOUNTER — Other Ambulatory Visit (INDEPENDENT_AMBULATORY_CARE_PROVIDER_SITE_OTHER): Payer: Medicare Other

## 2016-03-26 VITALS — BP 122/80 | HR 78 | Wt 155.0 lb

## 2016-03-26 DIAGNOSIS — Z23 Encounter for immunization: Secondary | ICD-10-CM | POA: Diagnosis not present

## 2016-03-26 DIAGNOSIS — R29898 Other symptoms and signs involving the musculoskeletal system: Secondary | ICD-10-CM

## 2016-03-26 DIAGNOSIS — E538 Deficiency of other specified B group vitamins: Secondary | ICD-10-CM | POA: Diagnosis not present

## 2016-03-26 DIAGNOSIS — I10 Essential (primary) hypertension: Secondary | ICD-10-CM

## 2016-03-26 DIAGNOSIS — R413 Other amnesia: Secondary | ICD-10-CM

## 2016-03-26 LAB — BASIC METABOLIC PANEL
BUN: 18 mg/dL (ref 6–23)
CALCIUM: 9.2 mg/dL (ref 8.4–10.5)
CHLORIDE: 104 meq/L (ref 96–112)
CO2: 29 meq/L (ref 19–32)
CREATININE: 0.81 mg/dL (ref 0.40–1.20)
GFR: 71.4 mL/min (ref >60.00)
Glucose, Bld: 89 mg/dL (ref 70–99)
Potassium: 4 mEq/L (ref 3.5–5.1)
Sodium: 141 mEq/L (ref 135–145)

## 2016-03-26 LAB — HEPATIC FUNCTION PANEL
ALT: 12 U/L (ref 0–35)
AST: 15 U/L (ref 0–37)
Albumin: 4 g/dL (ref 3.5–5.2)
Alkaline Phosphatase: 73 U/L (ref 39–117)
BILIRUBIN DIRECT: 0.1 mg/dL (ref 0.0–0.3)
BILIRUBIN TOTAL: 0.8 mg/dL (ref 0.2–1.2)
TOTAL PROTEIN: 6.8 g/dL (ref 6.0–8.3)

## 2016-03-26 MED ORDER — OLMESARTAN MEDOXOMIL 20 MG PO TABS: 20.0000 mg | ORAL_TABLET | Freq: Every day | ORAL | 3 refills | Status: AC

## 2016-03-26 MED ORDER — DONEPEZIL HCL 5 MG PO TABS
5.0000 mg | ORAL_TABLET | Freq: Every day | ORAL | 3 refills | Status: DC
Start: 2016-03-26 — End: 2016-04-14

## 2016-03-26 MED ORDER — CYANOCOBALAMIN 1000 MCG/ML IJ SOLN
1000.0000 ug | Freq: Once | INTRAMUSCULAR | Status: AC
  Administered 2016-03-26: 1000 ug via INTRAMUSCULAR

## 2016-03-26 NOTE — Addendum Note (Signed)
Addended by: Cresenciano Lick on: 03/26/2016 12:07 PM   Modules accepted: Orders

## 2016-03-26 NOTE — Progress Notes (Signed)
Pre visit review using our clinic review tool, if applicable. No additional management support is needed unless otherwise documented below in the visit note. 

## 2016-03-26 NOTE — Assessment & Plan Note (Signed)
On B12 po Shots of B12 when she is here

## 2016-03-26 NOTE — Assessment & Plan Note (Signed)
Chronic - controlled w/Rx On Benicar 

## 2016-03-26 NOTE — Progress Notes (Signed)
Subjective:  Patient ID: Pamela Callahan, female    DOB: 09/13/1928  Age: 82 y.o. MRN: [id number]  CC: No chief complaint on file.   HPI Pamela Callahan presents for anxiety, knee OA, insomnia, depression. C/o memory loss   Outpatient Medications Prior to Visit  Medication Sig Dispense Refill  . aspirin 81 MG tablet Take 81 mg by mouth daily.      . Cholecalciferol (VITAMIN D3) 1000 UNITS CAPS Take by mouth.      . clorazepate (TRANXENE) 7.5 MG tablet Take 0.5-1 tablets (3.75-7.5 mg total) by mouth 2 (two) times daily as needed for anxiety or sleep. 180 tablet 2  . fexofenadine (ALLEGRA) 180 MG tablet Take 1 tablet (180 mg total) by mouth daily. 90 tablet 2  . fluticasone (FLONASE) 50 MCG/ACT nasal spray Place 1 spray into both nostrils daily. As needed 16 g 11  . Magnesium Gluconate 550 MG TABS Take by mouth daily.      . Multiple Vitamins-Minerals (PRESERVISION/LUTEIN) CAPS Take by mouth 2 (two) times daily.      Marland Kitchen olmesartan (BENICAR) 20 MG tablet Take 1 tablet (20 mg total) by mouth daily. 90 tablet 3  . PARoxetine (PAXIL) 10 MG tablet TAKE 1 TABLET (10 MG TOTAL) BY MOUTH DAILY. 90 tablet 3  . prednisoLONE acetate (PRED FORTE) 1 % ophthalmic suspension Place 1 drop into the left eye 4 (four) times daily. Reported on 08/26/2015    . Syringe/Needle, Disp, (B-D ECLIPSE SYRINGE) 30G X 1/2" 1 ML MISC As directed 50 each 1  . triamcinolone cream (KENALOG) 0.5 % Apply topically 2 (two) times daily. 45 g 0  . vitamin B-12 (CYANOCOBALAMIN) 1000 MCG tablet Take 1 tablet (1,000 mcg total) by mouth daily. 100 tablet 3  . simvastatin (ZOCOR) 10 MG tablet Take 1 tablet (10 mg total) by mouth at bedtime. (Patient not taking: Reported on 03/26/2016) 90 tablet 3   No facility-administered medications prior to visit.     ROS Review of Systems  Constitutional: Positive for fatigue. Negative for activity change, appetite change, chills and unexpected weight change.  HENT: Negative for congestion,  mouth sores and sinus pressure.   Eyes: Negative for visual disturbance.  Respiratory: Negative for cough and chest tightness.   Gastrointestinal: Negative for abdominal pain and nausea.  Genitourinary: Negative for difficulty urinating, frequency and vaginal pain.  Musculoskeletal: Positive for arthralgias and gait problem. Negative for back pain.  Skin: Negative for pallor and rash.  Neurological: Negative for dizziness, tremors, weakness, numbness and headaches.  Psychiatric/Behavioral: Positive for decreased concentration. Negative for confusion and sleep disturbance.    Objective:  BP 122/80   Pulse 78   Wt 155 lb (70.3 kg)   SpO2 99%   BMI 22.89 kg/m   BP Readings from Last 3 Encounters:  03/26/16 122/80  12/25/15 140/90  08/26/15 (!) 148/90    Wt Readings from Last 3 Encounters:  03/26/16 155 lb (70.3 kg)  12/25/15 156 lb (70.8 kg)  08/26/15 154 lb (69.9 kg)    Physical Exam  Constitutional: She appears well-developed. No distress.  HENT:  Head: Normocephalic.  Right Ear: External ear normal.  Left Ear: External ear normal.  Nose: Nose normal.  Mouth/Throat: Oropharynx is clear and moist.  Eyes: Conjunctivae are normal. Pupils are equal, round, and reactive to light. Right eye exhibits no discharge. Left eye exhibits no discharge.  Neck: Normal range of motion. Neck supple. No JVD present. No tracheal deviation present. No thyromegaly present.  Cardiovascular: Normal rate, regular rhythm and normal heart sounds.   Pulmonary/Chest: No stridor. No respiratory distress. She has no wheezes.  Abdominal: Soft. Bowel sounds are normal. She exhibits no distension and no mass. There is no tenderness. There is no rebound and no guarding.  Musculoskeletal: She exhibits tenderness. She exhibits no edema.  Lymphadenopathy:    She has no cervical adenopathy.  Neurological: She displays normal reflexes. No cranial nerve deficit. She exhibits normal muscle tone. Coordination  abnormal.  Skin: No rash noted. No erythema.  Psychiatric: She has a normal mood and affect. Her behavior is normal. Judgment and thought content normal.  alert, cooperative  Lab Results  Component Value Date   WBC 5.9 08/26/2015   HGB 13.2 08/26/2015   HCT 40.0 08/26/2015   PLT 275.0 08/26/2015   GLUCOSE 97 08/26/2015   CHOL 174 09/11/2013   TRIG 80.0 09/11/2013   HDL 82.20 09/11/2013   LDLDIRECT 64.8 09/23/2008   LDLCALC 76 09/11/2013   ALT 12 07/01/2015   AST 17 07/01/2015   NA 143 08/26/2015   K 4.1 08/26/2015   CL 106 08/26/2015   CREATININE 0.73 08/26/2015   BUN 12 08/26/2015   CO2 29 08/26/2015   TSH 3.71 07/01/2015   INR 1.0 01/17/2008    Dg Sacrum/coccyx  Result Date: 08/28/2014 CLINICAL DATA:  82 year old female who fell onto buttocks 3 days ago with pain. Initial encounter. EXAM: SACRUM AND COCCYX - 2+ VIEW COMPARISON:  Pelvis x-rays 01/23/2008 and earlier. FINDINGS: Chronic bilateral hip arthroplasties partially visible. Bone mineralization is within normal limits for age. Bilateral pubic rami appear stable and intact. SI joints within normal limits. Sacral segments appear to remain intact. Coccygeal segments within normal limits. No definite sacral ala fracture. The L5 level appears intact. IMPRESSION: No acute fracture or dislocation identified about the sacrum or coccyx. Electronically Signed   By: Lars Pinks M.D.   On: 08/28/2014 09:41    Assessment & Plan:   There are no diagnoses linked to this encounter. I am having Ms. Davidson maintain her Vitamin D3, PRESERVISION/LUTEIN, Magnesium Gluconate, aspirin, fexofenadine, prednisoLONE acetate, fluticasone, simvastatin, triamcinolone cream, Syringe/Needle (Disp), olmesartan, clorazepate, PARoxetine, and vitamin B-12.  No orders of the defined types were placed in this encounter.    Follow-up: No Follow-up on file.  Walker Kehr, MD

## 2016-03-26 NOTE — Assessment & Plan Note (Signed)
Start Aricept 

## 2016-03-26 NOTE — Assessment & Plan Note (Signed)
Dr Delfino Lovett

## 2016-04-01 ENCOUNTER — Encounter: Payer: Self-pay | Admitting: Internal Medicine

## 2016-04-01 DIAGNOSIS — M1712 Unilateral primary osteoarthritis, left knee: Secondary | ICD-10-CM | POA: Diagnosis not present

## 2016-04-02 ENCOUNTER — Encounter (HOSPITAL_BASED_OUTPATIENT_CLINIC_OR_DEPARTMENT_OTHER): Payer: Self-pay | Admitting: *Deleted

## 2016-04-02 ENCOUNTER — Emergency Department (HOSPITAL_BASED_OUTPATIENT_CLINIC_OR_DEPARTMENT_OTHER): Payer: Medicare Other

## 2016-04-02 ENCOUNTER — Emergency Department (HOSPITAL_BASED_OUTPATIENT_CLINIC_OR_DEPARTMENT_OTHER)
Admission: EM | Admit: 2016-04-02 | Discharge: 2016-04-02 | Disposition: A | Discharge status: Home / Self Care | Payer: Medicare Other | Attending: Emergency Medicine | Admitting: Emergency Medicine

## 2016-04-02 DIAGNOSIS — R4182 Altered mental status, unspecified: Secondary | ICD-10-CM | POA: Diagnosis not present

## 2016-04-02 DIAGNOSIS — Z7982 Long term (current) use of aspirin: Secondary | ICD-10-CM | POA: Diagnosis not present

## 2016-04-02 DIAGNOSIS — Z79899 Other long term (current) drug therapy: Secondary | ICD-10-CM | POA: Insufficient documentation

## 2016-04-02 DIAGNOSIS — R9431 Abnormal electrocardiogram [ECG] [EKG]: Secondary | ICD-10-CM | POA: Diagnosis not present

## 2016-04-02 DIAGNOSIS — F0390 Unspecified dementia without behavioral disturbance: Secondary | ICD-10-CM | POA: Diagnosis not present

## 2016-04-02 DIAGNOSIS — Z91040 Latex allergy status: Secondary | ICD-10-CM | POA: Diagnosis not present

## 2016-04-02 DIAGNOSIS — I10 Essential (primary) hypertension: Secondary | ICD-10-CM | POA: Insufficient documentation

## 2016-04-02 DIAGNOSIS — R410 Disorientation, unspecified: Secondary | ICD-10-CM | POA: Diagnosis not present

## 2016-04-02 HISTORY — DX: Unspecified macular degeneration: H35.30

## 2016-04-02 LAB — COMPREHENSIVE METABOLIC PANEL
ALT: 15 U/L (ref 14–54)
ANION GAP: 8 (ref 5–15)
AST: 19 U/L (ref 15–41)
Albumin: 4.1 g/dL (ref 3.5–5.0)
Alkaline Phosphatase: 68 U/L (ref 38–126)
BUN: 21 mg/dL — ABNORMAL HIGH (ref 6–20)
CHLORIDE: 108 mmol/L (ref 101–111)
CO2: 25 mmol/L (ref 22–32)
Calcium: 9.1 mg/dL (ref 8.9–10.3)
Creatinine, Ser: 0.77 mg/dL (ref 0.44–1.00)
Glucose, Bld: 115 mg/dL — ABNORMAL HIGH (ref 65–99)
Potassium: 3.7 mmol/L (ref 3.5–5.1)
SODIUM: 141 mmol/L (ref 135–145)
Total Bilirubin: 1.1 mg/dL (ref 0.3–1.2)
Total Protein: 6.9 g/dL (ref 6.5–8.1)

## 2016-04-02 LAB — CBC WITH DIFFERENTIAL/PLATELET
Basophils Absolute: 0 10*3/uL (ref 0.0–0.1)
Basophils Relative: 1 %
EOS ABS: 0.1 10*3/uL (ref 0.0–0.7)
EOS PCT: 1 %
HCT: 37.6 % (ref 36.0–46.0)
Hemoglobin: 12.5 g/dL (ref 12.0–15.0)
LYMPHS ABS: 1 10*3/uL (ref 0.7–4.0)
Lymphocytes Relative: 21 %
MCH: 31.3 pg (ref 26.0–34.0)
MCHC: 33.2 g/dL (ref 30.0–36.0)
MCV: 94 fL (ref 78.0–100.0)
MONO ABS: 0.5 10*3/uL (ref 0.1–1.0)
MONOS PCT: 9 %
Neutro Abs: 3.4 10*3/uL (ref 1.7–7.7)
Neutrophils Relative %: 68 %
PLATELETS: 188 10*3/uL (ref 150–400)
RBC: 4 MIL/uL (ref 3.87–5.11)
RDW: 13 % (ref 11.5–15.5)
WBC: 5 10*3/uL (ref 4.0–10.5)

## 2016-04-02 LAB — URINALYSIS, ROUTINE W REFLEX MICROSCOPIC
Bilirubin Urine: NEGATIVE
Glucose, UA: NEGATIVE mg/dL
Hgb urine dipstick: NEGATIVE
KETONES UR: NEGATIVE mg/dL
LEUKOCYTES UA: NEGATIVE
NITRITE: NEGATIVE
PH: 6.5 (ref 5.0–8.0)
Protein, ur: NEGATIVE mg/dL
SPECIFIC GRAVITY, URINE: 1.017 (ref 1.005–1.030)

## 2016-04-02 NOTE — ED Provider Notes (Signed)
[facility] DEPT [facility] Provider Note   CSN: [id number] Arrival date & time: 04/02/16  0745     History   Chief Complaint Chief Complaint  Patient presents with  . Altered Mental Status    HPI Pamela Callahan is a 82 y.o. female.  82 yo F with a chief complaint of worsening confusion. This been going on for the past 3-4 months. Patient has seen her primary care provider and recently started on Aricept. Since then the family has noted that she has not been taking her medications as prescribed. They're here for more home health resources. Patient states that she is generally bored at home and that she feels lonely. Denies chest pain shortness breath abdominal pain vomiting diarrhea unilateral weakness. Denies recent head injury. Denies fevers or chills. Denies dysuria or increased frequency or hesitancy.   The history is provided by the patient and a relative.  Altered Mental Status   This is a new problem. The current episode started more than 1 week ago (past 3-4 months). The problem has not changed since onset.Associated symptoms include confusion. Her past medical history is significant for dementia. Her past medical history does not include head trauma.    Past Medical History:  Diagnosis Date  . Allergy   . Anxiety   . Depression   . Diverticulitis   . GERD (gastroesophageal reflux disease)   . Hyperlipidemia   . Hypertension   . Low back pain   . Macular degeneration of left eye   . Osteopenia   . PAC (premature atrial contraction)     Patient Active Problem List   Diagnosis Date Noted  . Memory loss 03/26/2016  . Osteoarthritis of left knee 12/25/2015  . Hearing loss of aging 12/25/2015  . Cough 08/06/2015  . Leg weakness, bilateral 07/01/2015  . Subacute confusional state 07/01/2015  . B12 deficiency 09/17/2014  . Hyponatremia 08/30/2014  . Fatigue 08/30/2014  . Hip hematoma, right 06/12/2014  . Rash and nonspecific skin eruption 03/12/2014  . PAC  (premature atrial contraction) 03/23/2013  . Heart murmur 03/06/2013  . RBBB (right bundle branch block) 03/06/2013  . NEOPLASM OF UNCERTAIN BEHAVIOR OF SKIN 09/09/2010  . SOLAR KERATOSIS 03/24/2010  . ALLERGIC RHINITIS 09/26/2008  . Dyslipidemia 01/02/2008  . Anxiety state 01/02/2008  . GERD 01/02/2008  . Situational depression 09/26/2007  . Essential hypertension 09/26/2007  . OSTEOARTHRITIS 09/26/2007  . LOW BACK PAIN 09/26/2007  . OSTEOPENIA 09/26/2007    Past Surgical History:  Procedure Laterality Date  . BACK SURGERY    . CHOLECYSTECTOMY  2006  . TOTAL HIP ARTHROPLASTY  2008   Right    OB History    No data available       Home Medications    Prior to Admission medications   Medication Sig Start Date End Date Taking? Authorizing Provider  aspirin 81 MG tablet Take 81 mg by mouth daily.     Yes Historical Provider, MD  Cholecalciferol (VITAMIN D3) 1000 UNITS CAPS Take by mouth.     Yes Historical Provider, MD  clorazepate (TRANXENE) 7.5 MG tablet Take 0.5-1 tablets (3.75-7.5 mg total) by mouth 2 (two) times daily as needed for anxiety or sleep. 07/01/15  Yes Evie Lacks Plotnikov, MD  donepezil (ARICEPT) 5 MG tablet Take 1 tablet (5 mg total) by mouth at bedtime. 03/26/16  Yes Evie Lacks Plotnikov, MD  fexofenadine (ALLEGRA) 180 MG tablet Take 1 tablet (180 mg total) by mouth daily. 12/21/11  Yes Evie Lacks  Plotnikov, MD  fluticasone (FLONASE) 50 MCG/ACT nasal spray Place 1 spray into both nostrils daily. As needed 09/11/13  Yes Evie Lacks Plotnikov, MD  Magnesium Gluconate 550 MG TABS Take by mouth daily.     Yes Historical Provider, MD  Multiple Vitamins-Minerals (PRESERVISION/LUTEIN) CAPS Take by mouth 2 (two) times daily.     Yes Historical Provider, MD  olmesartan (BENICAR) 20 MG tablet Take 1 tablet (20 mg total) by mouth daily. 03/26/16  Yes Evie Lacks Plotnikov, MD  PARoxetine (PAXIL) 10 MG tablet TAKE 1 TABLET (10 MG TOTAL) BY MOUTH DAILY. 08/19/15  Yes Evie Lacks  Plotnikov, MD  prednisoLONE acetate (PRED FORTE) 1 % ophthalmic suspension Place 1 drop into the left eye 4 (four) times daily. Reported on 08/26/2015   Yes Historical Provider, MD  triamcinolone cream (KENALOG) 0.5 % Apply topically 2 (two) times daily. 03/12/14  Yes Cassandria Anger, MD  vitamin B-12 (CYANOCOBALAMIN) 1000 MCG tablet Take 1 tablet (1,000 mcg total) by mouth daily. 12/25/15  Yes Evie Lacks Plotnikov, MD  simvastatin (ZOCOR) 10 MG tablet Take 1 tablet (10 mg total) by mouth at bedtime. Patient not taking: Reported on 03/26/2016 03/12/14   Cassandria Anger, MD    Family History Family History  Problem Relation Age of Onset  . Hypertension Mother   . Hypertension Other     Social History Social History  Substance Use Topics  . Smoking status: Never Smoker  . Smokeless tobacco: Never Used  . Alcohol use No     Allergies   Latex; Ace inhibitors; Penicillins; and Yellow dye   Review of Systems Review of Systems  Constitutional: Positive for activity change. Negative for chills and fever.  HENT: Negative for congestion and rhinorrhea.   Eyes: Negative for redness and visual disturbance.  Respiratory: Negative for shortness of breath and wheezing.   Cardiovascular: Negative for chest pain and palpitations.  Gastrointestinal: Negative for nausea and vomiting.  Genitourinary: Negative for dysuria and urgency.  Musculoskeletal: Negative for arthralgias and myalgias.  Skin: Negative for pallor and wound.  Neurological: Negative for dizziness and headaches.  Psychiatric/Behavioral: Positive for confusion.     Physical Exam Updated Vital Signs BP 115/82   Pulse 61   Temp 97.8 F (36.6 C) (Oral)   Resp 21   Ht 5\' 9"  (1.753 m)   Wt 152 lb (68.9 kg)   SpO2 100%   BMI 22.45 kg/m   Physical Exam  Constitutional: She is oriented to person, place, and time. She appears well-developed and well-nourished. No distress.  HENT:  Head: Normocephalic and atraumatic.    Eyes: EOM are normal. Pupils are equal, round, and reactive to light.  Neck: Normal range of motion. Neck supple.  Cardiovascular: Normal rate and regular rhythm.  Exam reveals no gallop and no friction rub.   No murmur heard. Pulmonary/Chest: Effort normal. She has no wheezes. She has no rales.  Abdominal: Soft. She exhibits no distension. There is no tenderness.  Musculoskeletal: She exhibits no edema or tenderness.  Neurological: She is alert and oriented to person, place, and time.  Grossly neuro intact  Skin: Skin is warm and dry. She is not diaphoretic.  Psychiatric: She has a normal mood and affect. Her behavior is normal.  Nursing note and vitals reviewed.    ED Treatments / Results  Labs (all labs ordered are listed, but only abnormal results are displayed) Labs Reviewed  COMPREHENSIVE METABOLIC PANEL - Abnormal; Notable for the following:  Result Value   Glucose, Bld 115 (*)    BUN 21 (*)    All other components within normal limits  CBC WITH DIFFERENTIAL/PLATELET  URINALYSIS, ROUTINE W REFLEX MICROSCOPIC (NOT AT [facility])    EKG  EKG Interpretation  Date/Time:  Friday April 02 2016 08:00:43 EDT Ventricular Rate:  69 PR Interval:    QRS Duration: 141 QT Interval:  435 QTC Calculation: 466 R Axis:   -38 Text Interpretation:  Sinus rhythm Right bundle branch block Baseline wander in lead(s) III V6 No significant change since last tracing Confirmed by Mateus Rewerts MD, DANIEL [phone]) on 04/02/2016 8:03:08 AM       Radiology Dg Chest 2 View  Result Date: 04/02/2016 CLINICAL DATA:  Altered mental status for 2-3 months EXAM: CHEST  2 VIEW COMPARISON:  09/09/2010 FINDINGS: The heart size and mediastinal contours are within normal limits. Both lungs are clear. The visualized skeletal structures are unremarkable. IMPRESSION: No active cardiopulmonary disease. Electronically Signed   By: Kerby Moors M.D.   On: 04/02/2016 08:44   Ct Head Wo Contrast  Result Date:  04/02/2016 CLINICAL DATA:  Confusion. EXAM: CT HEAD WITHOUT CONTRAST TECHNIQUE: Contiguous axial images were obtained from the base of the skull through the vertex without intravenous contrast. COMPARISON:  None. FINDINGS: Brain: No evidence of acute infarction, hemorrhage, hydrocephalus, extra-axial collection or mass lesion/mass effect. Prominence of the sulci and ventricles identified compatible with brain atrophy. There is mild patchy areas of low attenuation within the subcortical and periventricular white matter compatible with chronic microvascular disease. Vascular: No hyperdense vessel or unexpected calcification. Skull: The osseous skull is intact. Sinuses/Orbits: The paranasal sinuses and mastoid air cells are clear. Other: None IMPRESSION: 1. No acute intracranial abnormalities. 2. Brain atrophy and small vessel ischemic change. Electronically Signed   By: Kerby Moors M.D.   On: 04/02/2016 08:43    Procedures Procedures (including critical care time)  Medications Ordered in ED Medications - No data to display   Initial Impression / Assessment and Plan / ED Course  I have reviewed the triage vital signs and the nursing notes.  Pertinent labs & imaging results that were available during my care of the patient were reviewed by me and considered in my medical decision making (see chart for details).  Clinical Course    82 yo F With a chief complaint of worsening confusion. There may be a factor of depression associated with this. I will obtain a laboratory evaluation chest x-ray UA CT head. I discussed with case management for increased home health care at home. PCP follow up.   9:40 AM:  I have discussed the diagnosis/risks/treatment options with the patient and family and believe the pt to be eligible for discharge home to follow-up with PCP. We also discussed returning to the ED immediately if new or worsening sx occur. We discussed the sx which are most concerning (e.g., sudden  worsening pain, fever, inability to tolerate by mouth) that necessitate immediate return. Medications administered to the patient during their visit and any new prescriptions provided to the patient are listed below.  Medications given during this visit Medications - No data to display   The patient appears reasonably screen and/or stabilized for discharge and I doubt any other medical condition or other [facility] requiring further screening, evaluation, or treatment in the ED at this time prior to discharge.    Final Clinical Impressions(s) / ED Diagnoses   Final diagnoses:  Dementia, without behavioral disturbance    New  Prescriptions Discharge Medication List as of 04/02/2016  9:20 AM       Deno Etienne, DO 04/02/16 [id number]

## 2016-04-02 NOTE — Care Management Note (Signed)
Case Management Note  Patient Details  Name: Pamela Callahan MRN: [id number] Date of Birth: 02/10/1929  Subjective/Objective:                  82 yo F with a chief complaint of worsening confusion. /Home alone, son, Herbie [place] [phone]) nearby and very supportive.  Action/Plan: Follow for disposition needs.   Expected Discharge Date:  04/02/16               Expected Discharge Plan:  [facility]  In-House Referral:  NA  Discharge planning Services  CM Consult  Post Acute Care Choice:  Home Health Choice offered to:  Adult Children  DME Arranged:  N/A DME Agency:  NA  HH Arranged:  RN, PT, Social Work [employer] Agency:  [employer] (now [facility])  Status of Service:  Completed, signed off  If discussed at [employer], dates discussed:    Additional Comments: Krishang Reading J. Clydene Laming, RN, BSN, [employer] [phone] Spoke with pt at bedside regarding discharge planning for [facility]. Offered pt list of home health agencies to choose from.  Pt chose [facility] to render services. Christa See, RN of [facility] notified.  No DME needs identified at this time.  Pt physical address:  [place], [place] [address]  Please call son, Herbie [place] [phone]) to set up visits.   Fuller Mandril, RN 04/02/2016, 9:07 AM

## 2016-04-02 NOTE — ED Notes (Signed)
MD at bedside. 

## 2016-04-02 NOTE — ED Notes (Signed)
Patient transported to CT 

## 2016-04-02 NOTE — ED Triage Notes (Signed)
Per son -- Pt has had confusion for past 2-3 mos; was seen by MD a couple weeks ago and was prescribed Aricept (pt just began taking med 2 days ago and son reports worsening since beginning med). Pt lives at home alone. Denies other symptoms such as dizziness, chest pain, sob, n/v/d, genitourinary symptoms.

## 2016-04-02 NOTE — Discharge Instructions (Signed)
Follow up with your family doctor, continue your current medication regiment.

## 2016-04-03 ENCOUNTER — Other Ambulatory Visit: Payer: Self-pay | Admitting: Internal Medicine

## 2016-04-03 DIAGNOSIS — G301 Alzheimer's disease with late onset: Principal | ICD-10-CM

## 2016-04-03 DIAGNOSIS — F02818 Dementia in other diseases classified elsewhere, unspecified severity, with other behavioral disturbance: Secondary | ICD-10-CM

## 2016-04-03 DIAGNOSIS — F0281 Dementia in other diseases classified elsewhere with behavioral disturbance: Secondary | ICD-10-CM

## 2016-04-04 ENCOUNTER — Encounter: Payer: Self-pay | Admitting: Internal Medicine

## 2016-04-06 ENCOUNTER — Other Ambulatory Visit: Payer: Self-pay | Admitting: Internal Medicine

## 2016-04-06 DIAGNOSIS — R451 Restlessness and agitation: Secondary | ICD-10-CM

## 2016-04-09 ENCOUNTER — Telehealth: Payer: Self-pay | Admitting: Internal Medicine

## 2016-04-09 DIAGNOSIS — M1991 Primary osteoarthritis, unspecified site: Secondary | ICD-10-CM | POA: Diagnosis not present

## 2016-04-09 DIAGNOSIS — I10 Essential (primary) hypertension: Secondary | ICD-10-CM | POA: Diagnosis not present

## 2016-04-09 DIAGNOSIS — M545 Low back pain: Secondary | ICD-10-CM | POA: Diagnosis not present

## 2016-04-09 DIAGNOSIS — F0281 Dementia in other diseases classified elsewhere with behavioral disturbance: Secondary | ICD-10-CM | POA: Diagnosis not present

## 2016-04-09 DIAGNOSIS — I4510 Unspecified right bundle-branch block: Secondary | ICD-10-CM | POA: Diagnosis not present

## 2016-04-09 DIAGNOSIS — G301 Alzheimer's disease with late onset: Secondary | ICD-10-CM | POA: Diagnosis not present

## 2016-04-09 NOTE — Telephone Encounter (Signed)
Laura,nurse from [facility], request verbal order for skill nursing to continue to see Ms. Cosman 2 x a week for 2 weeks, 1x a week for 5 weeks ann 2 PRN for change in metal status. Please call her back

## 2016-04-09 NOTE — Telephone Encounter (Signed)
Ok Thx 

## 2016-04-12 DIAGNOSIS — I10 Essential (primary) hypertension: Secondary | ICD-10-CM | POA: Diagnosis not present

## 2016-04-12 DIAGNOSIS — F0281 Dementia in other diseases classified elsewhere with behavioral disturbance: Secondary | ICD-10-CM | POA: Diagnosis not present

## 2016-04-12 DIAGNOSIS — G301 Alzheimer's disease with late onset: Secondary | ICD-10-CM | POA: Diagnosis not present

## 2016-04-12 DIAGNOSIS — M545 Low back pain: Secondary | ICD-10-CM | POA: Diagnosis not present

## 2016-04-12 DIAGNOSIS — I4510 Unspecified right bundle-branch block: Secondary | ICD-10-CM | POA: Diagnosis not present

## 2016-04-12 DIAGNOSIS — M1991 Primary osteoarthritis, unspecified site: Secondary | ICD-10-CM | POA: Diagnosis not present

## 2016-04-12 NOTE — Telephone Encounter (Signed)
[facility] called back. I let her know Plot said it was ok. Erline Levine was in the room with Pt and Mickel Baas was trying to see patient. Thanks

## 2016-04-13 ENCOUNTER — Encounter: Payer: Self-pay | Admitting: Internal Medicine

## 2016-04-13 DIAGNOSIS — M545 Low back pain: Secondary | ICD-10-CM | POA: Diagnosis not present

## 2016-04-13 DIAGNOSIS — I10 Essential (primary) hypertension: Secondary | ICD-10-CM | POA: Diagnosis not present

## 2016-04-13 DIAGNOSIS — M1991 Primary osteoarthritis, unspecified site: Secondary | ICD-10-CM | POA: Diagnosis not present

## 2016-04-13 DIAGNOSIS — F0281 Dementia in other diseases classified elsewhere with behavioral disturbance: Secondary | ICD-10-CM | POA: Diagnosis not present

## 2016-04-13 DIAGNOSIS — G301 Alzheimer's disease with late onset: Secondary | ICD-10-CM | POA: Diagnosis not present

## 2016-04-13 DIAGNOSIS — I4510 Unspecified right bundle-branch block: Secondary | ICD-10-CM | POA: Diagnosis not present

## 2016-04-14 ENCOUNTER — Ambulatory Visit (INDEPENDENT_AMBULATORY_CARE_PROVIDER_SITE_OTHER): Payer: Medicare Other | Admitting: Internal Medicine

## 2016-04-14 ENCOUNTER — Encounter: Payer: Self-pay | Admitting: Internal Medicine

## 2016-04-14 DIAGNOSIS — R413 Other amnesia: Secondary | ICD-10-CM | POA: Diagnosis not present

## 2016-04-14 DIAGNOSIS — R5382 Chronic fatigue, unspecified: Secondary | ICD-10-CM

## 2016-04-14 DIAGNOSIS — Z23 Encounter for immunization: Secondary | ICD-10-CM | POA: Diagnosis not present

## 2016-04-14 DIAGNOSIS — E538 Deficiency of other specified B group vitamins: Secondary | ICD-10-CM | POA: Diagnosis not present

## 2016-04-14 DIAGNOSIS — F411 Generalized anxiety disorder: Secondary | ICD-10-CM

## 2016-04-14 MED ORDER — DONEPEZIL HCL 10 MG PO TABS: 10.0000 mg | ORAL_TABLET | Freq: Every day | ORAL | 11 refills | Status: AC

## 2016-04-14 NOTE — Progress Notes (Signed)
Pre visit review using our clinic review tool, if applicable. No additional management support is needed unless otherwise documented below in the visit note. 

## 2016-04-14 NOTE — Assessment & Plan Note (Signed)
Start Aricept 

## 2016-04-14 NOTE — Assessment & Plan Note (Addendum)
Paxil, Aricept Tranzene prn  Potential benefits of a long term benzodiazepines  use as well as potential risks  and complications were explained to the patient and were aknowledged.

## 2016-04-14 NOTE — Assessment & Plan Note (Signed)
Stable

## 2016-04-14 NOTE — Addendum Note (Signed)
Addended by: Cresenciano Lick on: 04/14/2016 04:53 PM   Modules accepted: Orders

## 2016-04-14 NOTE — Assessment & Plan Note (Signed)
  On B12 po Shots of B12 when she is here

## 2016-04-14 NOTE — Progress Notes (Signed)
Subjective:  Patient ID: Pamela Callahan, female    DOB: Jul 27, 1929  Age: 82 y.o. MRN: [id number]  CC: No chief complaint on file.   HPI Pamela Callahan presents for ER f/u - ER visit on 04/02/16 for worsening confusion   Outpatient Medications Prior to Visit  Medication Sig Dispense Refill  . aspirin 81 MG tablet Take 81 mg by mouth daily.      . Cholecalciferol (VITAMIN D3) 1000 UNITS CAPS Take by mouth.      . clorazepate (TRANXENE) 7.5 MG tablet Take 0.5-1 tablets (3.75-7.5 mg total) by mouth 2 (two) times daily as needed for anxiety or sleep. 180 tablet 2  . donepezil (ARICEPT) 5 MG tablet Take 1 tablet (5 mg total) by mouth at bedtime. 90 tablet 3  . fexofenadine (ALLEGRA) 180 MG tablet Take 1 tablet (180 mg total) by mouth daily. 90 tablet 2  . fluticasone (FLONASE) 50 MCG/ACT nasal spray Place 1 spray into both nostrils daily. As needed 16 g 11  . Magnesium Gluconate 550 MG TABS Take by mouth daily.      . Multiple Vitamins-Minerals (PRESERVISION/LUTEIN) CAPS Take by mouth 2 (two) times daily.      Marland Kitchen olmesartan (BENICAR) 20 MG tablet Take 1 tablet (20 mg total) by mouth daily. 90 tablet 3  . PARoxetine (PAXIL) 10 MG tablet TAKE 1 TABLET (10 MG TOTAL) BY MOUTH DAILY. 90 tablet 3  . prednisoLONE acetate (PRED FORTE) 1 % ophthalmic suspension Place 1 drop into the left eye 4 (four) times daily. Reported on 08/26/2015    . simvastatin (ZOCOR) 10 MG tablet Take 1 tablet (10 mg total) by mouth at bedtime. 90 tablet 3  . triamcinolone cream (KENALOG) 0.5 % Apply topically 2 (two) times daily. 45 g 0  . vitamin B-12 (CYANOCOBALAMIN) 1000 MCG tablet Take 1 tablet (1,000 mcg total) by mouth daily. 100 tablet 3   No facility-administered medications prior to visit.     ROS Review of Systems  Constitutional: Negative for activity change, appetite change, chills, fatigue and unexpected weight change.  HENT: Negative for congestion, mouth sores and sinus pressure.   Eyes: Negative for visual  disturbance.  Respiratory: Negative for cough and chest tightness.   Gastrointestinal: Negative for abdominal pain and nausea.  Genitourinary: Negative for difficulty urinating, frequency and vaginal pain.  Musculoskeletal: Negative for back pain and gait problem.  Skin: Negative for pallor and rash.  Neurological: Negative for dizziness, tremors, weakness, numbness and headaches.  Psychiatric/Behavioral: Positive for behavioral problems, decreased concentration and hallucinations. Negative for confusion, sleep disturbance and suicidal ideas. The patient is nervous/anxious.     Objective:  BP 130/80   Pulse 69   Wt 152 lb (68.9 kg)   SpO2 98%   BMI 22.45 kg/m   BP Readings from Last 3 Encounters:  04/14/16 130/80  04/02/16 115/82  03/26/16 122/80    Wt Readings from Last 3 Encounters:  04/14/16 152 lb (68.9 kg)  04/02/16 152 lb (68.9 kg)  03/26/16 155 lb (70.3 kg)    Physical Exam  Constitutional: She appears well-developed. No distress.  HENT:  Head: Normocephalic.  Right Ear: External ear normal.  Left Ear: External ear normal.  Nose: Nose normal.  Mouth/Throat: Oropharynx is clear and moist.  Eyes: Conjunctivae are normal. Pupils are equal, round, and reactive to light. Right eye exhibits no discharge. Left eye exhibits no discharge.  Neck: Normal range of motion. Neck supple. No JVD present. No tracheal deviation present.  No thyromegaly present.  Cardiovascular: Normal rate, regular rhythm and normal heart sounds.   Pulmonary/Chest: No stridor. No respiratory distress. She has no wheezes.  Abdominal: Soft. Bowel sounds are normal. She exhibits no distension and no mass. There is no tenderness. There is no rebound and no guarding.  Musculoskeletal: She exhibits tenderness. She exhibits no edema.  Lymphadenopathy:    She has no cervical adenopathy.  Neurological: She displays normal reflexes. No cranial nerve deficit. She exhibits normal muscle tone. Coordination  abnormal.  Skin: No rash noted. No erythema.  Psychiatric: Her behavior is normal. Thought content normal.  mildly confused  Lab Results  Component Value Date   WBC 5.0 04/02/2016   HGB 12.5 04/02/2016   HCT 37.6 04/02/2016   PLT 188 04/02/2016   GLUCOSE 115 (H) 04/02/2016   CHOL 174 09/11/2013   TRIG 80.0 09/11/2013   HDL 82.20 09/11/2013   LDLDIRECT 64.8 09/23/2008   LDLCALC 76 09/11/2013   ALT 15 04/02/2016   AST 19 04/02/2016   NA 141 04/02/2016   K 3.7 04/02/2016   CL 108 04/02/2016   CREATININE 0.77 04/02/2016   BUN 21 (H) 04/02/2016   CO2 25 04/02/2016   TSH 3.71 07/01/2015   INR 1.0 01/17/2008    Dg Chest 2 View  Result Date: 04/02/2016 CLINICAL DATA:  Altered mental status for 2-3 months EXAM: CHEST  2 VIEW COMPARISON:  09/09/2010 FINDINGS: The heart size and mediastinal contours are within normal limits. Both lungs are clear. The visualized skeletal structures are unremarkable. IMPRESSION: No active cardiopulmonary disease. Electronically Signed   By: Kerby Moors M.D.   On: 04/02/2016 08:44   Ct Head Wo Contrast  Result Date: 04/02/2016 CLINICAL DATA:  Confusion. EXAM: CT HEAD WITHOUT CONTRAST TECHNIQUE: Contiguous axial images were obtained from the base of the skull through the vertex without intravenous contrast. COMPARISON:  None. FINDINGS: Brain: No evidence of acute infarction, hemorrhage, hydrocephalus, extra-axial collection or mass lesion/mass effect. Prominence of the sulci and ventricles identified compatible with brain atrophy. There is mild patchy areas of low attenuation within the subcortical and periventricular white matter compatible with chronic microvascular disease. Vascular: No hyperdense vessel or unexpected calcification. Skull: The osseous skull is intact. Sinuses/Orbits: The paranasal sinuses and mastoid air cells are clear. Other: None IMPRESSION: 1. No acute intracranial abnormalities. 2. Brain atrophy and small vessel ischemic change.  Electronically Signed   By: Kerby Moors M.D.   On: 04/02/2016 08:43    Assessment & Plan:   There are no diagnoses linked to this encounter. I am having Ms. Delorenzo maintain her Vitamin D3, PRESERVISION/LUTEIN, Magnesium Gluconate, aspirin, fexofenadine, prednisoLONE acetate, fluticasone, simvastatin, triamcinolone cream, clorazepate, PARoxetine, vitamin B-12, olmesartan, and donepezil.  No orders of the defined types were placed in this encounter.    Follow-up: No Follow-up on file.  Pamela Kehr, MD

## 2016-04-20 DIAGNOSIS — G301 Alzheimer's disease with late onset: Secondary | ICD-10-CM | POA: Diagnosis not present

## 2016-04-20 DIAGNOSIS — I10 Essential (primary) hypertension: Secondary | ICD-10-CM | POA: Diagnosis not present

## 2016-04-20 DIAGNOSIS — F0281 Dementia in other diseases classified elsewhere with behavioral disturbance: Secondary | ICD-10-CM | POA: Diagnosis not present

## 2016-04-20 DIAGNOSIS — M545 Low back pain: Secondary | ICD-10-CM | POA: Diagnosis not present

## 2016-04-20 DIAGNOSIS — I4510 Unspecified right bundle-branch block: Secondary | ICD-10-CM | POA: Diagnosis not present

## 2016-04-20 DIAGNOSIS — M1991 Primary osteoarthritis, unspecified site: Secondary | ICD-10-CM | POA: Diagnosis not present

## 2016-04-21 ENCOUNTER — Telehealth: Payer: Self-pay | Admitting: Internal Medicine

## 2016-04-21 NOTE — Telephone Encounter (Signed)
[facility] [phone]  Need [place]  Speech therapy  1 week 1 2 week 4

## 2016-04-21 NOTE — Telephone Encounter (Signed)
Left detailed mess informing Junie Panning of verbal orders for below.

## 2016-04-22 ENCOUNTER — Telehealth: Payer: Self-pay | Admitting: Internal Medicine

## 2016-04-22 NOTE — Telephone Encounter (Signed)
Noted. Thx.

## 2016-04-22 NOTE — Telephone Encounter (Signed)
Sherri ,nurse from [facility], called to report a fall that Pamela Callahan had on 04/19/16. Pt has  and she was wondering around the house at night without a walker. Sherri said no one stay with pt at night but social worker is trying to help.

## 2016-04-23 DIAGNOSIS — I4510 Unspecified right bundle-branch block: Secondary | ICD-10-CM | POA: Diagnosis not present

## 2016-04-23 DIAGNOSIS — M1991 Primary osteoarthritis, unspecified site: Secondary | ICD-10-CM | POA: Diagnosis not present

## 2016-04-23 DIAGNOSIS — M545 Low back pain: Secondary | ICD-10-CM | POA: Diagnosis not present

## 2016-04-23 DIAGNOSIS — G301 Alzheimer's disease with late onset: Secondary | ICD-10-CM | POA: Diagnosis not present

## 2016-04-23 DIAGNOSIS — I10 Essential (primary) hypertension: Secondary | ICD-10-CM | POA: Diagnosis not present

## 2016-04-23 DIAGNOSIS — F0281 Dementia in other diseases classified elsewhere with behavioral disturbance: Secondary | ICD-10-CM | POA: Diagnosis not present

## 2016-04-26 DIAGNOSIS — I10 Essential (primary) hypertension: Secondary | ICD-10-CM | POA: Diagnosis not present

## 2016-04-26 DIAGNOSIS — M545 Low back pain: Secondary | ICD-10-CM | POA: Diagnosis not present

## 2016-04-26 DIAGNOSIS — M1991 Primary osteoarthritis, unspecified site: Secondary | ICD-10-CM | POA: Diagnosis not present

## 2016-04-26 DIAGNOSIS — I4510 Unspecified right bundle-branch block: Secondary | ICD-10-CM | POA: Diagnosis not present

## 2016-04-26 DIAGNOSIS — F0281 Dementia in other diseases classified elsewhere with behavioral disturbance: Secondary | ICD-10-CM | POA: Diagnosis not present

## 2016-04-26 DIAGNOSIS — G301 Alzheimer's disease with late onset: Secondary | ICD-10-CM | POA: Diagnosis not present

## 2016-04-28 DIAGNOSIS — G301 Alzheimer's disease with late onset: Secondary | ICD-10-CM | POA: Diagnosis not present

## 2016-04-28 DIAGNOSIS — M1991 Primary osteoarthritis, unspecified site: Secondary | ICD-10-CM | POA: Diagnosis not present

## 2016-04-28 DIAGNOSIS — I10 Essential (primary) hypertension: Secondary | ICD-10-CM | POA: Diagnosis not present

## 2016-04-28 DIAGNOSIS — M545 Low back pain: Secondary | ICD-10-CM | POA: Diagnosis not present

## 2016-04-28 DIAGNOSIS — F0281 Dementia in other diseases classified elsewhere with behavioral disturbance: Secondary | ICD-10-CM | POA: Diagnosis not present

## 2016-04-28 DIAGNOSIS — I4510 Unspecified right bundle-branch block: Secondary | ICD-10-CM | POA: Diagnosis not present

## 2016-04-29 DIAGNOSIS — H353222 Exudative age-related macular degeneration, left eye, with inactive choroidal neovascularization: Secondary | ICD-10-CM | POA: Diagnosis not present

## 2016-04-29 DIAGNOSIS — H353112 Nonexudative age-related macular degeneration, right eye, intermediate dry stage: Secondary | ICD-10-CM | POA: Diagnosis not present

## 2016-05-03 DIAGNOSIS — Z08 Encounter for follow-up examination after completed treatment for malignant neoplasm: Secondary | ICD-10-CM | POA: Diagnosis not present

## 2016-05-03 DIAGNOSIS — L821 Other seborrheic keratosis: Secondary | ICD-10-CM | POA: Diagnosis not present

## 2016-05-03 DIAGNOSIS — L570 Actinic keratosis: Secondary | ICD-10-CM | POA: Diagnosis not present

## 2016-05-03 DIAGNOSIS — Z85828 Personal history of other malignant neoplasm of skin: Secondary | ICD-10-CM | POA: Diagnosis not present

## 2016-05-04 DIAGNOSIS — F0281 Dementia in other diseases classified elsewhere with behavioral disturbance: Secondary | ICD-10-CM | POA: Diagnosis not present

## 2016-05-04 DIAGNOSIS — G301 Alzheimer's disease with late onset: Secondary | ICD-10-CM | POA: Diagnosis not present

## 2016-05-04 DIAGNOSIS — I4510 Unspecified right bundle-branch block: Secondary | ICD-10-CM | POA: Diagnosis not present

## 2016-05-04 DIAGNOSIS — M545 Low back pain: Secondary | ICD-10-CM | POA: Diagnosis not present

## 2016-05-04 DIAGNOSIS — I10 Essential (primary) hypertension: Secondary | ICD-10-CM | POA: Diagnosis not present

## 2016-05-04 DIAGNOSIS — M1991 Primary osteoarthritis, unspecified site: Secondary | ICD-10-CM | POA: Diagnosis not present

## 2016-05-05 DIAGNOSIS — F0281 Dementia in other diseases classified elsewhere with behavioral disturbance: Secondary | ICD-10-CM | POA: Diagnosis not present

## 2016-05-05 DIAGNOSIS — M1991 Primary osteoarthritis, unspecified site: Secondary | ICD-10-CM | POA: Diagnosis not present

## 2016-05-05 DIAGNOSIS — I4510 Unspecified right bundle-branch block: Secondary | ICD-10-CM | POA: Diagnosis not present

## 2016-05-05 DIAGNOSIS — G301 Alzheimer's disease with late onset: Secondary | ICD-10-CM | POA: Diagnosis not present

## 2016-05-05 DIAGNOSIS — I10 Essential (primary) hypertension: Secondary | ICD-10-CM | POA: Diagnosis not present

## 2016-05-05 DIAGNOSIS — M545 Low back pain: Secondary | ICD-10-CM | POA: Diagnosis not present

## 2016-05-07 DIAGNOSIS — M1991 Primary osteoarthritis, unspecified site: Secondary | ICD-10-CM | POA: Diagnosis not present

## 2016-05-07 DIAGNOSIS — I4510 Unspecified right bundle-branch block: Secondary | ICD-10-CM | POA: Diagnosis not present

## 2016-05-07 DIAGNOSIS — F0281 Dementia in other diseases classified elsewhere with behavioral disturbance: Secondary | ICD-10-CM | POA: Diagnosis not present

## 2016-05-07 DIAGNOSIS — I10 Essential (primary) hypertension: Secondary | ICD-10-CM | POA: Diagnosis not present

## 2016-05-07 DIAGNOSIS — M545 Low back pain: Secondary | ICD-10-CM | POA: Diagnosis not present

## 2016-05-07 DIAGNOSIS — G301 Alzheimer's disease with late onset: Secondary | ICD-10-CM | POA: Diagnosis not present

## 2016-05-10 ENCOUNTER — Ambulatory Visit (INDEPENDENT_AMBULATORY_CARE_PROVIDER_SITE_OTHER): Payer: Medicare Other

## 2016-05-10 DIAGNOSIS — Z111 Encounter for screening for respiratory tuberculosis: Secondary | ICD-10-CM | POA: Diagnosis not present

## 2016-05-10 DIAGNOSIS — G301 Alzheimer's disease with late onset: Secondary | ICD-10-CM | POA: Diagnosis not present

## 2016-05-10 DIAGNOSIS — F0281 Dementia in other diseases classified elsewhere with behavioral disturbance: Secondary | ICD-10-CM | POA: Diagnosis not present

## 2016-05-10 DIAGNOSIS — M545 Low back pain: Secondary | ICD-10-CM | POA: Diagnosis not present

## 2016-05-10 DIAGNOSIS — I10 Essential (primary) hypertension: Secondary | ICD-10-CM | POA: Diagnosis not present

## 2016-05-10 DIAGNOSIS — M1991 Primary osteoarthritis, unspecified site: Secondary | ICD-10-CM | POA: Diagnosis not present

## 2016-05-10 DIAGNOSIS — I4510 Unspecified right bundle-branch block: Secondary | ICD-10-CM | POA: Diagnosis not present

## 2016-05-10 MED ORDER — CLORAZEPATE DIPOTASSIUM 7.5 MG PO TABS: 7.5000 mg | ORAL_TABLET | Freq: Two times a day (BID) | ORAL | 2 refills | Status: DC | PRN

## 2016-05-12 DIAGNOSIS — I10 Essential (primary) hypertension: Secondary | ICD-10-CM | POA: Diagnosis not present

## 2016-05-12 DIAGNOSIS — M1991 Primary osteoarthritis, unspecified site: Secondary | ICD-10-CM | POA: Diagnosis not present

## 2016-05-12 DIAGNOSIS — F0281 Dementia in other diseases classified elsewhere with behavioral disturbance: Secondary | ICD-10-CM | POA: Diagnosis not present

## 2016-05-12 DIAGNOSIS — G301 Alzheimer's disease with late onset: Secondary | ICD-10-CM | POA: Diagnosis not present

## 2016-05-12 DIAGNOSIS — I4510 Unspecified right bundle-branch block: Secondary | ICD-10-CM | POA: Diagnosis not present

## 2016-05-12 DIAGNOSIS — M545 Low back pain: Secondary | ICD-10-CM | POA: Diagnosis not present

## 2016-05-17 DIAGNOSIS — F0281 Dementia in other diseases classified elsewhere with behavioral disturbance: Secondary | ICD-10-CM | POA: Diagnosis not present

## 2016-05-21 DIAGNOSIS — Z7982 Long term (current) use of aspirin: Secondary | ICD-10-CM | POA: Diagnosis not present

## 2016-05-21 DIAGNOSIS — F0391 Unspecified dementia with behavioral disturbance: Secondary | ICD-10-CM | POA: Diagnosis not present

## 2016-05-21 DIAGNOSIS — I10 Essential (primary) hypertension: Secondary | ICD-10-CM | POA: Diagnosis not present

## 2016-05-21 DIAGNOSIS — M1990 Unspecified osteoarthritis, unspecified site: Secondary | ICD-10-CM | POA: Diagnosis not present

## 2016-05-21 DIAGNOSIS — F419 Anxiety disorder, unspecified: Secondary | ICD-10-CM | POA: Diagnosis not present

## 2016-05-21 DIAGNOSIS — E538 Deficiency of other specified B group vitamins: Secondary | ICD-10-CM | POA: Diagnosis not present

## 2016-05-25 DIAGNOSIS — E538 Deficiency of other specified B group vitamins: Secondary | ICD-10-CM | POA: Diagnosis not present

## 2016-05-25 DIAGNOSIS — I10 Essential (primary) hypertension: Secondary | ICD-10-CM | POA: Diagnosis not present

## 2016-05-25 DIAGNOSIS — F419 Anxiety disorder, unspecified: Secondary | ICD-10-CM | POA: Diagnosis not present

## 2016-05-25 DIAGNOSIS — F0391 Unspecified dementia with behavioral disturbance: Secondary | ICD-10-CM | POA: Diagnosis not present

## 2016-05-25 DIAGNOSIS — Z7982 Long term (current) use of aspirin: Secondary | ICD-10-CM | POA: Diagnosis not present

## 2016-05-25 DIAGNOSIS — M1990 Unspecified osteoarthritis, unspecified site: Secondary | ICD-10-CM | POA: Diagnosis not present

## 2016-05-26 DIAGNOSIS — F0391 Unspecified dementia with behavioral disturbance: Secondary | ICD-10-CM | POA: Diagnosis not present

## 2016-05-26 DIAGNOSIS — F4321 Adjustment disorder with depressed mood: Secondary | ICD-10-CM | POA: Diagnosis not present

## 2016-05-26 DIAGNOSIS — F419 Anxiety disorder, unspecified: Secondary | ICD-10-CM | POA: Diagnosis not present

## 2016-05-27 DIAGNOSIS — F0391 Unspecified dementia with behavioral disturbance: Secondary | ICD-10-CM | POA: Diagnosis not present

## 2016-05-27 DIAGNOSIS — F419 Anxiety disorder, unspecified: Secondary | ICD-10-CM | POA: Diagnosis not present

## 2016-05-27 DIAGNOSIS — M1990 Unspecified osteoarthritis, unspecified site: Secondary | ICD-10-CM | POA: Diagnosis not present

## 2016-05-27 DIAGNOSIS — Z7982 Long term (current) use of aspirin: Secondary | ICD-10-CM | POA: Diagnosis not present

## 2016-05-27 DIAGNOSIS — I10 Essential (primary) hypertension: Secondary | ICD-10-CM | POA: Diagnosis not present

## 2016-05-27 DIAGNOSIS — E538 Deficiency of other specified B group vitamins: Secondary | ICD-10-CM | POA: Diagnosis not present

## 2016-05-28 DIAGNOSIS — Z7982 Long term (current) use of aspirin: Secondary | ICD-10-CM | POA: Diagnosis not present

## 2016-05-28 DIAGNOSIS — I10 Essential (primary) hypertension: Secondary | ICD-10-CM | POA: Diagnosis not present

## 2016-05-28 DIAGNOSIS — M1990 Unspecified osteoarthritis, unspecified site: Secondary | ICD-10-CM | POA: Diagnosis not present

## 2016-05-28 DIAGNOSIS — F0391 Unspecified dementia with behavioral disturbance: Secondary | ICD-10-CM | POA: Diagnosis not present

## 2016-05-28 DIAGNOSIS — E538 Deficiency of other specified B group vitamins: Secondary | ICD-10-CM | POA: Diagnosis not present

## 2016-05-28 DIAGNOSIS — F419 Anxiety disorder, unspecified: Secondary | ICD-10-CM | POA: Diagnosis not present

## 2016-05-31 DIAGNOSIS — F0391 Unspecified dementia with behavioral disturbance: Secondary | ICD-10-CM | POA: Diagnosis not present

## 2016-05-31 DIAGNOSIS — E538 Deficiency of other specified B group vitamins: Secondary | ICD-10-CM | POA: Diagnosis not present

## 2016-05-31 DIAGNOSIS — F419 Anxiety disorder, unspecified: Secondary | ICD-10-CM | POA: Diagnosis not present

## 2016-05-31 DIAGNOSIS — Z7982 Long term (current) use of aspirin: Secondary | ICD-10-CM | POA: Diagnosis not present

## 2016-05-31 DIAGNOSIS — I10 Essential (primary) hypertension: Secondary | ICD-10-CM | POA: Diagnosis not present

## 2016-05-31 DIAGNOSIS — M1990 Unspecified osteoarthritis, unspecified site: Secondary | ICD-10-CM | POA: Diagnosis not present

## 2016-06-02 DIAGNOSIS — I10 Essential (primary) hypertension: Secondary | ICD-10-CM | POA: Diagnosis not present

## 2016-06-02 DIAGNOSIS — M1990 Unspecified osteoarthritis, unspecified site: Secondary | ICD-10-CM | POA: Diagnosis not present

## 2016-06-02 DIAGNOSIS — F0391 Unspecified dementia with behavioral disturbance: Secondary | ICD-10-CM | POA: Diagnosis not present

## 2016-06-02 DIAGNOSIS — E538 Deficiency of other specified B group vitamins: Secondary | ICD-10-CM | POA: Diagnosis not present

## 2016-06-02 DIAGNOSIS — Z7982 Long term (current) use of aspirin: Secondary | ICD-10-CM | POA: Diagnosis not present

## 2016-06-02 DIAGNOSIS — F419 Anxiety disorder, unspecified: Secondary | ICD-10-CM | POA: Diagnosis not present

## 2016-06-07 DIAGNOSIS — I10 Essential (primary) hypertension: Secondary | ICD-10-CM | POA: Diagnosis not present

## 2016-06-07 DIAGNOSIS — Z7982 Long term (current) use of aspirin: Secondary | ICD-10-CM | POA: Diagnosis not present

## 2016-06-07 DIAGNOSIS — F419 Anxiety disorder, unspecified: Secondary | ICD-10-CM | POA: Diagnosis not present

## 2016-06-07 DIAGNOSIS — F0391 Unspecified dementia with behavioral disturbance: Secondary | ICD-10-CM | POA: Diagnosis not present

## 2016-06-07 DIAGNOSIS — E538 Deficiency of other specified B group vitamins: Secondary | ICD-10-CM | POA: Diagnosis not present

## 2016-06-07 DIAGNOSIS — M1990 Unspecified osteoarthritis, unspecified site: Secondary | ICD-10-CM | POA: Diagnosis not present

## 2016-06-08 DIAGNOSIS — F4321 Adjustment disorder with depressed mood: Secondary | ICD-10-CM | POA: Diagnosis not present

## 2016-06-08 DIAGNOSIS — I10 Essential (primary) hypertension: Secondary | ICD-10-CM | POA: Diagnosis not present

## 2016-06-08 DIAGNOSIS — Z7982 Long term (current) use of aspirin: Secondary | ICD-10-CM | POA: Diagnosis not present

## 2016-06-08 DIAGNOSIS — F419 Anxiety disorder, unspecified: Secondary | ICD-10-CM | POA: Diagnosis not present

## 2016-06-08 DIAGNOSIS — M1990 Unspecified osteoarthritis, unspecified site: Secondary | ICD-10-CM | POA: Diagnosis not present

## 2016-06-08 DIAGNOSIS — F0281 Dementia in other diseases classified elsewhere with behavioral disturbance: Secondary | ICD-10-CM | POA: Diagnosis not present

## 2016-06-08 DIAGNOSIS — F0391 Unspecified dementia with behavioral disturbance: Secondary | ICD-10-CM | POA: Diagnosis not present

## 2016-06-08 DIAGNOSIS — E538 Deficiency of other specified B group vitamins: Secondary | ICD-10-CM | POA: Diagnosis not present

## 2016-06-10 DIAGNOSIS — H353112 Nonexudative age-related macular degeneration, right eye, intermediate dry stage: Secondary | ICD-10-CM | POA: Diagnosis not present

## 2016-06-10 DIAGNOSIS — H353222 Exudative age-related macular degeneration, left eye, with inactive choroidal neovascularization: Secondary | ICD-10-CM | POA: Diagnosis not present

## 2016-06-15 DIAGNOSIS — E538 Deficiency of other specified B group vitamins: Secondary | ICD-10-CM | POA: Diagnosis not present

## 2016-06-15 DIAGNOSIS — F4321 Adjustment disorder with depressed mood: Secondary | ICD-10-CM | POA: Diagnosis not present

## 2016-06-15 DIAGNOSIS — Z7982 Long term (current) use of aspirin: Secondary | ICD-10-CM | POA: Diagnosis not present

## 2016-06-15 DIAGNOSIS — I10 Essential (primary) hypertension: Secondary | ICD-10-CM | POA: Diagnosis not present

## 2016-06-15 DIAGNOSIS — F0391 Unspecified dementia with behavioral disturbance: Secondary | ICD-10-CM | POA: Diagnosis not present

## 2016-06-15 DIAGNOSIS — M1990 Unspecified osteoarthritis, unspecified site: Secondary | ICD-10-CM | POA: Diagnosis not present

## 2016-06-15 DIAGNOSIS — F419 Anxiety disorder, unspecified: Secondary | ICD-10-CM | POA: Diagnosis not present

## 2016-06-28 DIAGNOSIS — E538 Deficiency of other specified B group vitamins: Secondary | ICD-10-CM | POA: Diagnosis not present

## 2016-06-28 DIAGNOSIS — F419 Anxiety disorder, unspecified: Secondary | ICD-10-CM | POA: Diagnosis not present

## 2016-06-28 DIAGNOSIS — I10 Essential (primary) hypertension: Secondary | ICD-10-CM | POA: Diagnosis not present

## 2016-06-28 DIAGNOSIS — M1990 Unspecified osteoarthritis, unspecified site: Secondary | ICD-10-CM | POA: Diagnosis not present

## 2016-06-28 DIAGNOSIS — Z7982 Long term (current) use of aspirin: Secondary | ICD-10-CM | POA: Diagnosis not present

## 2016-06-28 DIAGNOSIS — F0391 Unspecified dementia with behavioral disturbance: Secondary | ICD-10-CM | POA: Diagnosis not present

## 2016-06-29 DIAGNOSIS — F4321 Adjustment disorder with depressed mood: Secondary | ICD-10-CM | POA: Diagnosis not present

## 2016-06-30 DIAGNOSIS — M1990 Unspecified osteoarthritis, unspecified site: Secondary | ICD-10-CM | POA: Diagnosis not present

## 2016-06-30 DIAGNOSIS — I10 Essential (primary) hypertension: Secondary | ICD-10-CM | POA: Diagnosis not present

## 2016-06-30 DIAGNOSIS — Z7982 Long term (current) use of aspirin: Secondary | ICD-10-CM | POA: Diagnosis not present

## 2016-06-30 DIAGNOSIS — F419 Anxiety disorder, unspecified: Secondary | ICD-10-CM | POA: Diagnosis not present

## 2016-06-30 DIAGNOSIS — F4321 Adjustment disorder with depressed mood: Secondary | ICD-10-CM | POA: Diagnosis not present

## 2016-06-30 DIAGNOSIS — F0391 Unspecified dementia with behavioral disturbance: Secondary | ICD-10-CM | POA: Diagnosis not present

## 2016-06-30 DIAGNOSIS — E538 Deficiency of other specified B group vitamins: Secondary | ICD-10-CM | POA: Diagnosis not present

## 2016-07-01 ENCOUNTER — Ambulatory Visit: Payer: Medicare Other | Admitting: Internal Medicine

## 2016-07-01 DIAGNOSIS — E538 Deficiency of other specified B group vitamins: Secondary | ICD-10-CM | POA: Diagnosis not present

## 2016-07-01 DIAGNOSIS — F419 Anxiety disorder, unspecified: Secondary | ICD-10-CM | POA: Diagnosis not present

## 2016-07-01 DIAGNOSIS — Z7982 Long term (current) use of aspirin: Secondary | ICD-10-CM | POA: Diagnosis not present

## 2016-07-01 DIAGNOSIS — F0391 Unspecified dementia with behavioral disturbance: Secondary | ICD-10-CM | POA: Diagnosis not present

## 2016-07-01 DIAGNOSIS — M1990 Unspecified osteoarthritis, unspecified site: Secondary | ICD-10-CM | POA: Diagnosis not present

## 2016-07-01 DIAGNOSIS — I10 Essential (primary) hypertension: Secondary | ICD-10-CM | POA: Diagnosis not present

## 2016-07-05 DIAGNOSIS — E538 Deficiency of other specified B group vitamins: Secondary | ICD-10-CM | POA: Diagnosis not present

## 2016-07-05 DIAGNOSIS — F419 Anxiety disorder, unspecified: Secondary | ICD-10-CM | POA: Diagnosis not present

## 2016-07-05 DIAGNOSIS — F0391 Unspecified dementia with behavioral disturbance: Secondary | ICD-10-CM | POA: Diagnosis not present

## 2016-07-05 DIAGNOSIS — I10 Essential (primary) hypertension: Secondary | ICD-10-CM | POA: Diagnosis not present

## 2016-07-05 DIAGNOSIS — Z7982 Long term (current) use of aspirin: Secondary | ICD-10-CM | POA: Diagnosis not present

## 2016-07-05 DIAGNOSIS — M1990 Unspecified osteoarthritis, unspecified site: Secondary | ICD-10-CM | POA: Diagnosis not present

## 2016-07-06 DIAGNOSIS — F4321 Adjustment disorder with depressed mood: Secondary | ICD-10-CM | POA: Diagnosis not present

## 2016-07-06 DIAGNOSIS — F0391 Unspecified dementia with behavioral disturbance: Secondary | ICD-10-CM | POA: Diagnosis not present

## 2016-07-08 DIAGNOSIS — M1990 Unspecified osteoarthritis, unspecified site: Secondary | ICD-10-CM | POA: Diagnosis not present

## 2016-07-08 DIAGNOSIS — E538 Deficiency of other specified B group vitamins: Secondary | ICD-10-CM | POA: Diagnosis not present

## 2016-07-08 DIAGNOSIS — I10 Essential (primary) hypertension: Secondary | ICD-10-CM | POA: Diagnosis not present

## 2016-07-08 DIAGNOSIS — F0391 Unspecified dementia with behavioral disturbance: Secondary | ICD-10-CM | POA: Diagnosis not present

## 2016-07-08 DIAGNOSIS — F419 Anxiety disorder, unspecified: Secondary | ICD-10-CM | POA: Diagnosis not present

## 2016-07-08 DIAGNOSIS — Z7982 Long term (current) use of aspirin: Secondary | ICD-10-CM | POA: Diagnosis not present

## 2016-07-13 DIAGNOSIS — F419 Anxiety disorder, unspecified: Secondary | ICD-10-CM | POA: Diagnosis not present

## 2016-07-13 DIAGNOSIS — F4321 Adjustment disorder with depressed mood: Secondary | ICD-10-CM | POA: Diagnosis not present

## 2016-07-15 DIAGNOSIS — Z7982 Long term (current) use of aspirin: Secondary | ICD-10-CM | POA: Diagnosis not present

## 2016-07-15 DIAGNOSIS — M1990 Unspecified osteoarthritis, unspecified site: Secondary | ICD-10-CM | POA: Diagnosis not present

## 2016-07-15 DIAGNOSIS — F419 Anxiety disorder, unspecified: Secondary | ICD-10-CM | POA: Diagnosis not present

## 2016-07-15 DIAGNOSIS — F0391 Unspecified dementia with behavioral disturbance: Secondary | ICD-10-CM | POA: Diagnosis not present

## 2016-07-15 DIAGNOSIS — I10 Essential (primary) hypertension: Secondary | ICD-10-CM | POA: Diagnosis not present

## 2016-07-15 DIAGNOSIS — E538 Deficiency of other specified B group vitamins: Secondary | ICD-10-CM | POA: Diagnosis not present

## 2016-07-27 DIAGNOSIS — F0391 Unspecified dementia with behavioral disturbance: Secondary | ICD-10-CM | POA: Diagnosis not present

## 2016-07-27 DIAGNOSIS — I10 Essential (primary) hypertension: Secondary | ICD-10-CM | POA: Diagnosis not present

## 2016-07-27 DIAGNOSIS — F4321 Adjustment disorder with depressed mood: Secondary | ICD-10-CM | POA: Diagnosis not present

## 2016-07-27 DIAGNOSIS — F419 Anxiety disorder, unspecified: Secondary | ICD-10-CM | POA: Diagnosis not present

## 2016-07-28 DIAGNOSIS — F419 Anxiety disorder, unspecified: Secondary | ICD-10-CM | POA: Diagnosis not present

## 2016-07-28 DIAGNOSIS — F0391 Unspecified dementia with behavioral disturbance: Secondary | ICD-10-CM | POA: Diagnosis not present

## 2016-07-28 DIAGNOSIS — F4321 Adjustment disorder with depressed mood: Secondary | ICD-10-CM | POA: Diagnosis not present

## 2016-08-05 DIAGNOSIS — Z9889 Other specified postprocedural states: Secondary | ICD-10-CM | POA: Diagnosis not present

## 2016-08-05 DIAGNOSIS — H353112 Nonexudative age-related macular degeneration, right eye, intermediate dry stage: Secondary | ICD-10-CM | POA: Diagnosis not present

## 2016-08-05 DIAGNOSIS — Z961 Presence of intraocular lens: Secondary | ICD-10-CM | POA: Diagnosis not present

## 2016-08-05 DIAGNOSIS — H353222 Exudative age-related macular degeneration, left eye, with inactive choroidal neovascularization: Secondary | ICD-10-CM | POA: Diagnosis not present

## 2016-08-25 DIAGNOSIS — F0391 Unspecified dementia with behavioral disturbance: Secondary | ICD-10-CM | POA: Diagnosis not present

## 2016-08-25 DIAGNOSIS — F4321 Adjustment disorder with depressed mood: Secondary | ICD-10-CM | POA: Diagnosis not present

## 2016-08-25 DIAGNOSIS — F419 Anxiety disorder, unspecified: Secondary | ICD-10-CM | POA: Diagnosis not present

## 2016-08-26 DIAGNOSIS — E119 Type 2 diabetes mellitus without complications: Secondary | ICD-10-CM | POA: Diagnosis not present

## 2016-08-26 DIAGNOSIS — E782 Mixed hyperlipidemia: Secondary | ICD-10-CM | POA: Diagnosis not present

## 2016-08-26 DIAGNOSIS — E559 Vitamin D deficiency, unspecified: Secondary | ICD-10-CM | POA: Diagnosis not present

## 2016-08-26 DIAGNOSIS — E038 Other specified hypothyroidism: Secondary | ICD-10-CM | POA: Diagnosis not present

## 2016-08-26 DIAGNOSIS — D518 Other vitamin B12 deficiency anemias: Secondary | ICD-10-CM | POA: Diagnosis not present

## 2016-08-26 DIAGNOSIS — Z79899 Other long term (current) drug therapy: Secondary | ICD-10-CM | POA: Diagnosis not present

## 2016-08-27 DIAGNOSIS — F4321 Adjustment disorder with depressed mood: Secondary | ICD-10-CM | POA: Diagnosis not present

## 2016-08-27 DIAGNOSIS — F419 Anxiety disorder, unspecified: Secondary | ICD-10-CM | POA: Diagnosis not present

## 2016-09-10 DIAGNOSIS — F419 Anxiety disorder, unspecified: Secondary | ICD-10-CM | POA: Diagnosis not present

## 2016-09-10 DIAGNOSIS — F4321 Adjustment disorder with depressed mood: Secondary | ICD-10-CM | POA: Diagnosis not present

## 2016-09-14 DIAGNOSIS — F419 Anxiety disorder, unspecified: Secondary | ICD-10-CM | POA: Diagnosis not present

## 2016-09-14 DIAGNOSIS — F4321 Adjustment disorder with depressed mood: Secondary | ICD-10-CM | POA: Diagnosis not present

## 2016-09-20 DIAGNOSIS — I10 Essential (primary) hypertension: Secondary | ICD-10-CM | POA: Diagnosis not present

## 2016-09-20 DIAGNOSIS — F0391 Unspecified dementia with behavioral disturbance: Secondary | ICD-10-CM | POA: Diagnosis not present

## 2016-09-20 DIAGNOSIS — M545 Low back pain: Secondary | ICD-10-CM | POA: Diagnosis not present

## 2016-09-20 DIAGNOSIS — M1990 Unspecified osteoarthritis, unspecified site: Secondary | ICD-10-CM | POA: Diagnosis not present

## 2016-09-24 DIAGNOSIS — Z7982 Long term (current) use of aspirin: Secondary | ICD-10-CM | POA: Diagnosis not present

## 2016-09-24 DIAGNOSIS — F419 Anxiety disorder, unspecified: Secondary | ICD-10-CM | POA: Diagnosis not present

## 2016-09-24 DIAGNOSIS — F4321 Adjustment disorder with depressed mood: Secondary | ICD-10-CM | POA: Diagnosis not present

## 2016-09-24 DIAGNOSIS — I10 Essential (primary) hypertension: Secondary | ICD-10-CM | POA: Diagnosis not present

## 2016-09-24 DIAGNOSIS — F0391 Unspecified dementia with behavioral disturbance: Secondary | ICD-10-CM | POA: Diagnosis not present

## 2016-09-24 DIAGNOSIS — Z8673 Personal history of transient ischemic attack (TIA), and cerebral infarction without residual deficits: Secondary | ICD-10-CM | POA: Diagnosis not present

## 2016-09-24 DIAGNOSIS — M545 Low back pain: Secondary | ICD-10-CM | POA: Diagnosis not present

## 2016-09-28 DIAGNOSIS — F419 Anxiety disorder, unspecified: Secondary | ICD-10-CM | POA: Diagnosis not present

## 2016-09-28 DIAGNOSIS — M545 Low back pain: Secondary | ICD-10-CM | POA: Diagnosis not present

## 2016-09-28 DIAGNOSIS — Z8673 Personal history of transient ischemic attack (TIA), and cerebral infarction without residual deficits: Secondary | ICD-10-CM | POA: Diagnosis not present

## 2016-09-28 DIAGNOSIS — F0391 Unspecified dementia with behavioral disturbance: Secondary | ICD-10-CM | POA: Diagnosis not present

## 2016-09-28 DIAGNOSIS — I10 Essential (primary) hypertension: Secondary | ICD-10-CM | POA: Diagnosis not present

## 2016-09-28 DIAGNOSIS — Z7982 Long term (current) use of aspirin: Secondary | ICD-10-CM | POA: Diagnosis not present

## 2016-10-01 DIAGNOSIS — F419 Anxiety disorder, unspecified: Secondary | ICD-10-CM | POA: Diagnosis not present

## 2016-10-01 DIAGNOSIS — F4321 Adjustment disorder with depressed mood: Secondary | ICD-10-CM | POA: Diagnosis not present

## 2016-10-06 DIAGNOSIS — F0391 Unspecified dementia with behavioral disturbance: Secondary | ICD-10-CM | POA: Diagnosis not present

## 2016-10-06 DIAGNOSIS — I10 Essential (primary) hypertension: Secondary | ICD-10-CM | POA: Diagnosis not present

## 2016-10-06 DIAGNOSIS — F419 Anxiety disorder, unspecified: Secondary | ICD-10-CM | POA: Diagnosis not present

## 2016-10-06 DIAGNOSIS — M545 Low back pain: Secondary | ICD-10-CM | POA: Diagnosis not present

## 2016-10-07 DIAGNOSIS — H43812 Vitreous degeneration, left eye: Secondary | ICD-10-CM | POA: Diagnosis not present

## 2016-10-07 DIAGNOSIS — H353112 Nonexudative age-related macular degeneration, right eye, intermediate dry stage: Secondary | ICD-10-CM | POA: Diagnosis not present

## 2016-10-07 DIAGNOSIS — H353222 Exudative age-related macular degeneration, left eye, with inactive choroidal neovascularization: Secondary | ICD-10-CM | POA: Diagnosis not present

## 2016-10-07 DIAGNOSIS — Z961 Presence of intraocular lens: Secondary | ICD-10-CM | POA: Diagnosis not present

## 2016-10-08 DIAGNOSIS — F4321 Adjustment disorder with depressed mood: Secondary | ICD-10-CM | POA: Diagnosis not present

## 2016-10-08 DIAGNOSIS — F419 Anxiety disorder, unspecified: Secondary | ICD-10-CM | POA: Diagnosis not present

## 2016-10-11 DIAGNOSIS — M545 Low back pain: Secondary | ICD-10-CM | POA: Diagnosis not present

## 2016-10-11 DIAGNOSIS — N390 Urinary tract infection, site not specified: Secondary | ICD-10-CM | POA: Diagnosis not present

## 2016-10-11 DIAGNOSIS — Z7982 Long term (current) use of aspirin: Secondary | ICD-10-CM | POA: Diagnosis not present

## 2016-10-11 DIAGNOSIS — F0391 Unspecified dementia with behavioral disturbance: Secondary | ICD-10-CM | POA: Diagnosis not present

## 2016-10-11 DIAGNOSIS — F419 Anxiety disorder, unspecified: Secondary | ICD-10-CM | POA: Diagnosis not present

## 2016-10-11 DIAGNOSIS — I10 Essential (primary) hypertension: Secondary | ICD-10-CM | POA: Diagnosis not present

## 2016-10-11 DIAGNOSIS — Z8673 Personal history of transient ischemic attack (TIA), and cerebral infarction without residual deficits: Secondary | ICD-10-CM | POA: Diagnosis not present

## 2016-10-15 DIAGNOSIS — N390 Urinary tract infection, site not specified: Secondary | ICD-10-CM | POA: Diagnosis not present

## 2016-10-18 ENCOUNTER — Telehealth: Payer: Self-pay | Admitting: Internal Medicine

## 2016-10-18 DIAGNOSIS — I10 Essential (primary) hypertension: Secondary | ICD-10-CM | POA: Diagnosis not present

## 2016-10-18 DIAGNOSIS — M1990 Unspecified osteoarthritis, unspecified site: Secondary | ICD-10-CM | POA: Diagnosis not present

## 2016-10-18 DIAGNOSIS — F419 Anxiety disorder, unspecified: Secondary | ICD-10-CM | POA: Diagnosis not present

## 2016-10-18 DIAGNOSIS — F0391 Unspecified dementia with behavioral disturbance: Secondary | ICD-10-CM | POA: Diagnosis not present

## 2016-10-18 NOTE — Telephone Encounter (Signed)
Attempted to call patient, but phone was disconnected.

## 2016-10-19 DIAGNOSIS — F419 Anxiety disorder, unspecified: Secondary | ICD-10-CM | POA: Diagnosis not present

## 2016-10-19 DIAGNOSIS — F4321 Adjustment disorder with depressed mood: Secondary | ICD-10-CM | POA: Diagnosis not present

## 2016-10-26 DIAGNOSIS — M1712 Unilateral primary osteoarthritis, left knee: Secondary | ICD-10-CM | POA: Diagnosis not present

## 2016-10-26 DIAGNOSIS — M25562 Pain in left knee: Secondary | ICD-10-CM | POA: Diagnosis not present

## 2016-10-26 DIAGNOSIS — G8929 Other chronic pain: Secondary | ICD-10-CM | POA: Diagnosis not present

## 2016-10-27 DIAGNOSIS — Z8673 Personal history of transient ischemic attack (TIA), and cerebral infarction without residual deficits: Secondary | ICD-10-CM | POA: Diagnosis not present

## 2016-10-27 DIAGNOSIS — M545 Low back pain: Secondary | ICD-10-CM | POA: Diagnosis not present

## 2016-10-27 DIAGNOSIS — I10 Essential (primary) hypertension: Secondary | ICD-10-CM | POA: Diagnosis not present

## 2016-10-27 DIAGNOSIS — F0391 Unspecified dementia with behavioral disturbance: Secondary | ICD-10-CM | POA: Diagnosis not present

## 2016-10-27 DIAGNOSIS — F4321 Adjustment disorder with depressed mood: Secondary | ICD-10-CM | POA: Diagnosis not present

## 2016-10-27 DIAGNOSIS — Z7982 Long term (current) use of aspirin: Secondary | ICD-10-CM | POA: Diagnosis not present

## 2016-10-27 DIAGNOSIS — F419 Anxiety disorder, unspecified: Secondary | ICD-10-CM | POA: Diagnosis not present

## 2016-11-02 DIAGNOSIS — F419 Anxiety disorder, unspecified: Secondary | ICD-10-CM | POA: Diagnosis not present

## 2016-11-02 DIAGNOSIS — M1712 Unilateral primary osteoarthritis, left knee: Secondary | ICD-10-CM | POA: Diagnosis not present

## 2016-11-02 DIAGNOSIS — F4321 Adjustment disorder with depressed mood: Secondary | ICD-10-CM | POA: Diagnosis not present

## 2016-11-04 DIAGNOSIS — E782 Mixed hyperlipidemia: Secondary | ICD-10-CM | POA: Diagnosis not present

## 2016-11-04 DIAGNOSIS — Z79899 Other long term (current) drug therapy: Secondary | ICD-10-CM | POA: Diagnosis not present

## 2016-11-04 DIAGNOSIS — E119 Type 2 diabetes mellitus without complications: Secondary | ICD-10-CM | POA: Diagnosis not present

## 2016-11-04 DIAGNOSIS — D518 Other vitamin B12 deficiency anemias: Secondary | ICD-10-CM | POA: Diagnosis not present

## 2016-11-09 DIAGNOSIS — F4321 Adjustment disorder with depressed mood: Secondary | ICD-10-CM | POA: Diagnosis not present

## 2016-11-09 DIAGNOSIS — M1712 Unilateral primary osteoarthritis, left knee: Secondary | ICD-10-CM | POA: Diagnosis not present

## 2016-11-09 DIAGNOSIS — F419 Anxiety disorder, unspecified: Secondary | ICD-10-CM | POA: Diagnosis not present

## 2016-11-10 DIAGNOSIS — Z8673 Personal history of transient ischemic attack (TIA), and cerebral infarction without residual deficits: Secondary | ICD-10-CM | POA: Diagnosis not present

## 2016-11-10 DIAGNOSIS — I10 Essential (primary) hypertension: Secondary | ICD-10-CM | POA: Diagnosis not present

## 2016-11-10 DIAGNOSIS — F0391 Unspecified dementia with behavioral disturbance: Secondary | ICD-10-CM | POA: Diagnosis not present

## 2016-11-10 DIAGNOSIS — F419 Anxiety disorder, unspecified: Secondary | ICD-10-CM | POA: Diagnosis not present

## 2016-11-10 DIAGNOSIS — Z7982 Long term (current) use of aspirin: Secondary | ICD-10-CM | POA: Diagnosis not present

## 2016-11-10 DIAGNOSIS — M545 Low back pain: Secondary | ICD-10-CM | POA: Diagnosis not present

## 2016-11-17 DIAGNOSIS — F419 Anxiety disorder, unspecified: Secondary | ICD-10-CM | POA: Diagnosis not present

## 2016-11-17 DIAGNOSIS — F4321 Adjustment disorder with depressed mood: Secondary | ICD-10-CM | POA: Diagnosis not present

## 2016-11-18 DIAGNOSIS — N390 Urinary tract infection, site not specified: Secondary | ICD-10-CM | POA: Diagnosis not present

## 2016-11-22 DIAGNOSIS — F4321 Adjustment disorder with depressed mood: Secondary | ICD-10-CM | POA: Diagnosis not present

## 2016-11-22 DIAGNOSIS — F419 Anxiety disorder, unspecified: Secondary | ICD-10-CM | POA: Diagnosis not present

## 2016-11-22 DIAGNOSIS — F0391 Unspecified dementia with behavioral disturbance: Secondary | ICD-10-CM | POA: Diagnosis not present

## 2016-12-02 DIAGNOSIS — D518 Other vitamin B12 deficiency anemias: Secondary | ICD-10-CM | POA: Diagnosis not present

## 2016-12-02 DIAGNOSIS — E119 Type 2 diabetes mellitus without complications: Secondary | ICD-10-CM | POA: Diagnosis not present

## 2016-12-02 DIAGNOSIS — Z79899 Other long term (current) drug therapy: Secondary | ICD-10-CM | POA: Diagnosis not present

## 2016-12-02 DIAGNOSIS — E559 Vitamin D deficiency, unspecified: Secondary | ICD-10-CM | POA: Diagnosis not present

## 2016-12-07 DIAGNOSIS — F419 Anxiety disorder, unspecified: Secondary | ICD-10-CM | POA: Diagnosis not present

## 2016-12-07 DIAGNOSIS — F4321 Adjustment disorder with depressed mood: Secondary | ICD-10-CM | POA: Diagnosis not present

## 2016-12-14 DIAGNOSIS — F339 Major depressive disorder, recurrent, unspecified: Secondary | ICD-10-CM | POA: Diagnosis not present

## 2016-12-14 DIAGNOSIS — F0390 Unspecified dementia without behavioral disturbance: Secondary | ICD-10-CM | POA: Diagnosis not present

## 2016-12-14 DIAGNOSIS — F419 Anxiety disorder, unspecified: Secondary | ICD-10-CM | POA: Diagnosis not present

## 2016-12-14 DIAGNOSIS — J302 Other seasonal allergic rhinitis: Secondary | ICD-10-CM | POA: Diagnosis not present

## 2016-12-14 DIAGNOSIS — I10 Essential (primary) hypertension: Secondary | ICD-10-CM | POA: Diagnosis not present

## 2016-12-16 DIAGNOSIS — Z79899 Other long term (current) drug therapy: Secondary | ICD-10-CM | POA: Diagnosis not present

## 2016-12-28 DIAGNOSIS — L209 Atopic dermatitis, unspecified: Secondary | ICD-10-CM | POA: Diagnosis not present

## 2016-12-28 DIAGNOSIS — F339 Major depressive disorder, recurrent, unspecified: Secondary | ICD-10-CM | POA: Diagnosis not present

## 2016-12-28 DIAGNOSIS — F419 Anxiety disorder, unspecified: Secondary | ICD-10-CM | POA: Diagnosis not present

## 2016-12-28 DIAGNOSIS — I10 Essential (primary) hypertension: Secondary | ICD-10-CM | POA: Diagnosis not present

## 2016-12-28 DIAGNOSIS — J309 Allergic rhinitis, unspecified: Secondary | ICD-10-CM | POA: Diagnosis not present

## 2016-12-29 DIAGNOSIS — F339 Major depressive disorder, recurrent, unspecified: Secondary | ICD-10-CM | POA: Diagnosis not present

## 2016-12-29 DIAGNOSIS — I10 Essential (primary) hypertension: Secondary | ICD-10-CM | POA: Diagnosis not present

## 2016-12-29 DIAGNOSIS — F419 Anxiety disorder, unspecified: Secondary | ICD-10-CM | POA: Diagnosis not present

## 2016-12-29 DIAGNOSIS — Z79899 Other long term (current) drug therapy: Secondary | ICD-10-CM | POA: Diagnosis not present

## 2016-12-29 DIAGNOSIS — F0390 Unspecified dementia without behavioral disturbance: Secondary | ICD-10-CM | POA: Diagnosis not present

## 2017-01-09 DIAGNOSIS — F4320 Adjustment disorder, unspecified: Secondary | ICD-10-CM | POA: Diagnosis not present

## 2017-01-09 DIAGNOSIS — F0280 Dementia in other diseases classified elsewhere without behavioral disturbance: Secondary | ICD-10-CM | POA: Diagnosis not present

## 2017-01-13 DIAGNOSIS — H43812 Vitreous degeneration, left eye: Secondary | ICD-10-CM | POA: Diagnosis not present

## 2017-01-13 DIAGNOSIS — H353222 Exudative age-related macular degeneration, left eye, with inactive choroidal neovascularization: Secondary | ICD-10-CM | POA: Diagnosis not present

## 2017-01-13 DIAGNOSIS — Z961 Presence of intraocular lens: Secondary | ICD-10-CM | POA: Diagnosis not present

## 2017-01-13 DIAGNOSIS — H353112 Nonexudative age-related macular degeneration, right eye, intermediate dry stage: Secondary | ICD-10-CM | POA: Diagnosis not present

## 2017-01-25 DIAGNOSIS — L219 Seborrheic dermatitis, unspecified: Secondary | ICD-10-CM | POA: Diagnosis not present

## 2017-01-25 DIAGNOSIS — L981 Factitial dermatitis: Secondary | ICD-10-CM | POA: Diagnosis not present

## 2017-01-26 DIAGNOSIS — Z880 Allergy status to penicillin: Secondary | ICD-10-CM | POA: Diagnosis not present

## 2017-01-26 DIAGNOSIS — F0391 Unspecified dementia with behavioral disturbance: Secondary | ICD-10-CM | POA: Diagnosis not present

## 2017-01-26 DIAGNOSIS — Z780 Asymptomatic menopausal state: Secondary | ICD-10-CM | POA: Diagnosis not present

## 2017-01-26 DIAGNOSIS — R251 Tremor, unspecified: Secondary | ICD-10-CM | POA: Diagnosis not present

## 2017-01-26 DIAGNOSIS — T1490XA Injury, unspecified, initial encounter: Secondary | ICD-10-CM | POA: Diagnosis not present

## 2017-01-28 DIAGNOSIS — L981 Factitial dermatitis: Secondary | ICD-10-CM | POA: Diagnosis not present

## 2017-01-28 DIAGNOSIS — I10 Essential (primary) hypertension: Secondary | ICD-10-CM | POA: Diagnosis not present

## 2017-01-28 DIAGNOSIS — Z79899 Other long term (current) drug therapy: Secondary | ICD-10-CM | POA: Diagnosis not present

## 2017-01-28 DIAGNOSIS — F4320 Adjustment disorder, unspecified: Secondary | ICD-10-CM | POA: Diagnosis not present

## 2017-01-28 DIAGNOSIS — F0390 Unspecified dementia without behavioral disturbance: Secondary | ICD-10-CM | POA: Diagnosis not present

## 2017-01-28 DIAGNOSIS — F419 Anxiety disorder, unspecified: Secondary | ICD-10-CM | POA: Diagnosis not present

## 2017-02-10 ENCOUNTER — Emergency Department (HOSPITAL_COMMUNITY): Payer: Medicare Other

## 2017-02-10 ENCOUNTER — Inpatient Hospital Stay (HOSPITAL_COMMUNITY)
Admission: EM | Admit: 2017-02-10 | Discharge: 2017-02-15 | DRG: 552 | Disposition: A | Discharge status: Skilled Nursing Facility | Payer: Medicare Other | Attending: Internal Medicine | Admitting: Internal Medicine

## 2017-02-10 DIAGNOSIS — Z96641 Presence of right artificial hip joint: Secondary | ICD-10-CM | POA: Diagnosis not present

## 2017-02-10 DIAGNOSIS — G309 Alzheimer's disease, unspecified: Secondary | ICD-10-CM | POA: Diagnosis present

## 2017-02-10 DIAGNOSIS — S32020A Wedge compression fracture of second lumbar vertebra, initial encounter for closed fracture: Secondary | ICD-10-CM

## 2017-02-10 DIAGNOSIS — Z91048 Other nonmedicinal substance allergy status: Secondary | ICD-10-CM

## 2017-02-10 DIAGNOSIS — W1830XA Fall on same level, unspecified, initial encounter: Secondary | ICD-10-CM | POA: Diagnosis present

## 2017-02-10 DIAGNOSIS — I10 Essential (primary) hypertension: Secondary | ICD-10-CM | POA: Diagnosis present

## 2017-02-10 DIAGNOSIS — S064XAA Epidural hemorrhage with loss of consciousness status unknown, initial encounter: Secondary | ICD-10-CM | POA: Diagnosis present

## 2017-02-10 DIAGNOSIS — M48061 Spinal stenosis, lumbar region without neurogenic claudication: Secondary | ICD-10-CM | POA: Diagnosis present

## 2017-02-10 DIAGNOSIS — R51 Headache: Secondary | ICD-10-CM | POA: Diagnosis not present

## 2017-02-10 DIAGNOSIS — I959 Hypotension, unspecified: Secondary | ICD-10-CM | POA: Diagnosis not present

## 2017-02-10 DIAGNOSIS — M542 Cervicalgia: Secondary | ICD-10-CM | POA: Diagnosis not present

## 2017-02-10 DIAGNOSIS — R9431 Abnormal electrocardiogram [ECG] [EKG]: Secondary | ICD-10-CM | POA: Diagnosis not present

## 2017-02-10 DIAGNOSIS — Z66 Do not resuscitate: Secondary | ICD-10-CM | POA: Diagnosis present

## 2017-02-10 DIAGNOSIS — M545 Low back pain: Secondary | ICD-10-CM | POA: Diagnosis not present

## 2017-02-10 DIAGNOSIS — S064X9A Epidural hemorrhage with loss of consciousness of unspecified duration, initial encounter: Secondary | ICD-10-CM | POA: Diagnosis present

## 2017-02-10 DIAGNOSIS — Z91040 Latex allergy status: Secondary | ICD-10-CM

## 2017-02-10 DIAGNOSIS — F0390 Unspecified dementia without behavioral disturbance: Secondary | ICD-10-CM | POA: Diagnosis present

## 2017-02-10 DIAGNOSIS — M546 Pain in thoracic spine: Secondary | ICD-10-CM | POA: Diagnosis not present

## 2017-02-10 DIAGNOSIS — S0990XA Unspecified injury of head, initial encounter: Secondary | ICD-10-CM | POA: Diagnosis not present

## 2017-02-10 DIAGNOSIS — Z888 Allergy status to other drugs, medicaments and biological substances status: Secondary | ICD-10-CM

## 2017-02-10 DIAGNOSIS — F03C0 Unspecified dementia, severe, without behavioral disturbance, psychotic disturbance, mood disturbance, and anxiety: Secondary | ICD-10-CM | POA: Diagnosis present

## 2017-02-10 DIAGNOSIS — W19XXXA Unspecified fall, initial encounter: Secondary | ICD-10-CM

## 2017-02-10 DIAGNOSIS — S32028A Other fracture of second lumbar vertebra, initial encounter for closed fracture: Principal | ICD-10-CM | POA: Diagnosis present

## 2017-02-10 DIAGNOSIS — R079 Chest pain, unspecified: Secondary | ICD-10-CM | POA: Diagnosis not present

## 2017-02-10 DIAGNOSIS — F418 Other specified anxiety disorders: Secondary | ICD-10-CM | POA: Diagnosis not present

## 2017-02-10 DIAGNOSIS — Z79899 Other long term (current) drug therapy: Secondary | ICD-10-CM

## 2017-02-10 DIAGNOSIS — S32029A Unspecified fracture of second lumbar vertebra, initial encounter for closed fracture: Secondary | ICD-10-CM

## 2017-02-10 DIAGNOSIS — Z7982 Long term (current) use of aspirin: Secondary | ICD-10-CM

## 2017-02-10 DIAGNOSIS — T148XXA Other injury of unspecified body region, initial encounter: Secondary | ICD-10-CM | POA: Diagnosis not present

## 2017-02-10 DIAGNOSIS — Z880 Allergy status to penicillin: Secondary | ICD-10-CM

## 2017-02-10 DIAGNOSIS — S199XXA Unspecified injury of neck, initial encounter: Secondary | ICD-10-CM | POA: Diagnosis not present

## 2017-02-10 DIAGNOSIS — I4510 Unspecified right bundle-branch block: Secondary | ICD-10-CM | POA: Diagnosis present

## 2017-02-10 DIAGNOSIS — E538 Deficiency of other specified B group vitamins: Secondary | ICD-10-CM | POA: Diagnosis present

## 2017-02-10 DIAGNOSIS — R0602 Shortness of breath: Secondary | ICD-10-CM | POA: Diagnosis not present

## 2017-02-10 DIAGNOSIS — F0280 Dementia in other diseases classified elsewhere without behavioral disturbance: Secondary | ICD-10-CM | POA: Diagnosis present

## 2017-02-10 LAB — BASIC METABOLIC PANEL
Anion gap: 9 (ref 5–15)
BUN: 16 mg/dL (ref 6–20)
CO2: 27 mmol/L (ref 22–32)
Calcium: 8.6 mg/dL — ABNORMAL LOW (ref 8.9–10.3)
Chloride: 97 mmol/L — ABNORMAL LOW (ref 101–111)
Creatinine, Ser: 0.69 mg/dL (ref 0.44–1.00)
GFR calc Af Amer: >60 mL/min (ref >60)
GFR calc non Af Amer: >60 mL/min (ref >60)
Glucose, Bld: 97 mg/dL (ref 65–99)
Potassium: 4.1 mmol/L (ref 3.5–5.1)
Sodium: 133 mmol/L — ABNORMAL LOW (ref 135–145)

## 2017-02-10 LAB — ETHANOL: Alcohol, Ethyl (B): <5 mg/dL (ref <5)

## 2017-02-10 LAB — RAPID URINE DRUG SCREEN, HOSP PERFORMED
Amphetamines: NOT DETECTED
Barbiturates: NOT DETECTED
Benzodiazepines: POSITIVE — AB
Cocaine: NOT DETECTED
Opiates: NOT DETECTED
Tetrahydrocannabinol: NOT DETECTED

## 2017-02-10 LAB — URINALYSIS, ROUTINE W REFLEX MICROSCOPIC
Bilirubin Urine: NEGATIVE
Glucose, UA: NEGATIVE mg/dL
Hgb urine dipstick: NEGATIVE
Ketones, ur: 5 mg/dL — AB
Leukocytes, UA: NEGATIVE
Nitrite: NEGATIVE
Protein, ur: NEGATIVE mg/dL
Specific Gravity, Urine: 1.01 (ref 1.005–1.030)
pH: 8 (ref 5.0–8.0)

## 2017-02-10 LAB — I-STAT CHEM 8, ED
BUN: 14 mg/dL (ref 6–20)
Calcium, Ion: 1.12 mmol/L — ABNORMAL LOW (ref 1.15–1.40)
Chloride: 94 mmol/L — ABNORMAL LOW (ref 101–111)
Creatinine, Ser: 0.7 mg/dL (ref 0.44–1.00)
Glucose, Bld: 92 mg/dL (ref 65–99)
HCT: 36 % (ref 36.0–46.0)
Hemoglobin: 12.2 g/dL (ref 12.0–15.0)
Potassium: 4 mmol/L (ref 3.5–5.1)
Sodium: 133 mmol/L — ABNORMAL LOW (ref 135–145)
TCO2: 28 mmol/L (ref 0–100)

## 2017-02-10 LAB — CBC WITH DIFFERENTIAL/PLATELET
Basophils Absolute: 0 10*3/uL (ref 0.0–0.1)
Basophils Relative: 0 %
Eosinophils Absolute: 0 10*3/uL (ref 0.0–0.7)
Eosinophils Relative: 1 %
HCT: 34.5 % — ABNORMAL LOW (ref 36.0–46.0)
Hemoglobin: 11.8 g/dL — ABNORMAL LOW (ref 12.0–15.0)
Lymphocytes Relative: 11 %
Lymphs Abs: 0.9 10*3/uL (ref 0.7–4.0)
MCH: 30.7 pg (ref 26.0–34.0)
MCHC: 34.2 g/dL (ref 30.0–36.0)
MCV: 89.8 fL (ref 78.0–100.0)
Monocytes Absolute: 0.7 10*3/uL (ref 0.1–1.0)
Monocytes Relative: 9 %
Neutro Abs: 6.9 10*3/uL (ref 1.7–7.7)
Neutrophils Relative %: 79 %
Platelets: 163 10*3/uL (ref 150–400)
RBC: 3.84 MIL/uL — ABNORMAL LOW (ref 3.87–5.11)
RDW: 13.6 % (ref 11.5–15.5)
WBC: 8.6 10*3/uL (ref 4.0–10.5)

## 2017-02-10 LAB — PROTIME-INR
INR: 1.05
Prothrombin Time: 13.7 seconds (ref 11.4–15.2)

## 2017-02-10 LAB — I-STAT TROPONIN, ED: Troponin i, poc: 0 ng/mL (ref 0.00–0.08)

## 2017-02-10 LAB — APTT: aPTT: 34 seconds (ref 24–36)

## 2017-02-10 MED ORDER — IOPAMIDOL (ISOVUE-370) INJECTION 76%
INTRAVENOUS | Status: AC
  Filled 2017-02-10: qty 100

## 2017-02-10 MED ORDER — IOPAMIDOL (ISOVUE-370) INJECTION 76%
100.0000 mL | Freq: Once | INTRAVENOUS | Status: AC | PRN
  Administered 2017-02-10: 100 mL via INTRAVENOUS

## 2017-02-10 NOTE — ED Triage Notes (Signed)
Pt presents from [facility] [address] [address]) Memory Care Unit after having had an unwitnessed fall. States she now has lower back pain. Hx of Alzheimer's dementia. Alert and oriented per baseline. No blood thinners.

## 2017-02-10 NOTE — ED Notes (Signed)
Pt is alert  To self place and situation, disoriented to time and DOB. Pt states that her back hurts because of a fall. Pt is able to follow commands and has PMS x 4 extremities. Pt is able to ambulate 1 person stand by assistance to bathroom. Pt son is at bedside.

## 2017-02-10 NOTE — ED Provider Notes (Signed)
[facility] DEPT Provider Note   CSN: [id number] Arrival date & time: 02/10/17  1719     History   Chief Complaint Chief Complaint  Patient presents with  . Fall  . Back Pain    HPI Pamela Callahan is a 83 y.o. female with history of Alzheimer's dementia, GERD, low back pain, hypertension who presents following an unwitnessed fall with low back pain. Patient reports that she was working on fixing a piece of furniture and fell down from a standing position. Patient denies hitting her head or losing consciousness. She denies any pain except in her low back. She is not on any blood thinners. She denies any headache, lightheadedness, dizziness, chest pain, shortness of breath, abdominal pain, nausea, vomiting, urinary symptoms. She was given something for pain prior to arrival, however unknown.  HPI  Past Medical History:  Diagnosis Date  . Allergy   . Anxiety   . Depression   . Diverticulitis   . GERD (gastroesophageal reflux disease)   . Hyperlipidemia   . Hypertension   . Low back pain   . Macular degeneration of left eye   . Osteopenia   . PAC (premature atrial contraction)     Patient Active Problem List   Diagnosis Date Noted  . Lacunar stroke ([place]) 02/11/2017  . Fall 02/11/2017  . Depression with anxiety 02/11/2017  . Closed L2 vertebral fracture ([place]) 02/11/2017  . Traumatic epidural hematoma 02/11/2017  . Memory loss 03/26/2016  . Osteoarthritis of left knee 12/25/2015  . Hearing loss of aging 12/25/2015  . Cough 08/06/2015  . Leg weakness, bilateral 07/01/2015  . Subacute confusional state 07/01/2015  . B12 deficiency 09/17/2014  . Hyponatremia 08/30/2014  . Fatigue 08/30/2014  . Hip hematoma, right 06/12/2014  . Rash and nonspecific skin eruption 03/12/2014  . PAC (premature atrial contraction) 03/23/2013  . Heart murmur 03/06/2013  . RBBB (right bundle branch block) 03/06/2013  . NEOPLASM OF UNCERTAIN BEHAVIOR OF SKIN 09/09/2010  . SOLAR KERATOSIS  03/24/2010  . ALLERGIC RHINITIS 09/26/2008  . Dyslipidemia 01/02/2008  . Anxiety state 01/02/2008  . GERD 01/02/2008  . Situational depression 09/26/2007  . Essential hypertension 09/26/2007  . OSTEOARTHRITIS 09/26/2007  . LOW BACK PAIN 09/26/2007  . OSTEOPENIA 09/26/2007    Past Surgical History:  Procedure Laterality Date  . BACK SURGERY    . CHOLECYSTECTOMY  2006  . TOTAL HIP ARTHROPLASTY  2008   Right    OB History    No data available       Home Medications    Prior to Admission medications   Medication Sig Start Date End Date Taking? Authorizing Provider  aspirin 81 MG tablet Take 81 mg by mouth daily.     Yes [provider]  clorazepate (TRANXENE) 3.75 MG tablet Take 3.75 mg by mouth 2 (two) times daily as needed for anxiety.   Yes [provider]  divalproex (DEPAKOTE) 250 MG DR tablet Take 250 mg by mouth 2 (two) times daily.   Yes [provider]  donepezil (ARICEPT) 10 MG tablet Take 1 tablet (10 mg total) by mouth at bedtime. Patient taking differently: Take 20 mg by mouth at bedtime.  04/14/16  Yes Plotnikov, Evie Lacks, MD  fexofenadine (ALLEGRA) 180 MG tablet Take 1 tablet (180 mg total) by mouth daily. 12/21/11  Yes Plotnikov, Evie Lacks, MD  LORazepam (ATIVAN) 1 MG tablet Take 1 mg by mouth 3 (three) times daily.   Yes [provider]  Magnesium Gluconate  550 MG TABS Take 1 tablet by mouth daily.    Yes [provider]  memantine (NAMENDA) 10 MG tablet Take 10 mg by mouth daily.  02/06/17  Yes [provider]  Multiple Vitamins-Minerals (PRESERVISION/LUTEIN) CAPS Take by mouth 2 (two) times daily.     Yes [provider]  olmesartan (BENICAR) 20 MG tablet Take 1 tablet (20 mg total) by mouth daily. 03/26/16  Yes Plotnikov, Evie Lacks, MD  PARoxetine (PAXIL) 10 MG tablet TAKE 1 TABLET (10 MG TOTAL) BY MOUTH DAILY. 08/19/15  Yes Plotnikov, Evie Lacks, MD  PARoxetine (PAXIL) 20 MG tablet Take 20 mg by mouth  daily.  02/06/17  Yes [provider]  vitamin B-12 (CYANOCOBALAMIN) 1000 MCG tablet Take 1 tablet (1,000 mcg total) by mouth daily. 12/25/15  Yes Plotnikov, Evie Lacks, MD  clorazepate (TRANXENE) 7.5 MG tablet Take 1 tablet (7.5 mg total) by mouth 2 (two) times daily as needed for anxiety or sleep. Patient not taking: Reported on 02/10/2017 05/10/16   Plotnikov, Evie Lacks, MD  fluticasone (FLONASE) 50 MCG/ACT nasal spray Place 1 spray into both nostrils daily. As needed Patient not taking: Reported on 02/10/2017 09/11/13   Plotnikov, Evie Lacks, MD  triamcinolone cream (KENALOG) 0.5 % Apply topically 2 (two) times daily. Patient not taking: Reported on 02/10/2017 03/12/14   Plotnikov, Evie Lacks, MD    Family History Family History  Problem Relation Age of Onset  . Hypertension Mother   . Hypertension Other     Social History Social History  Substance Use Topics  . Smoking status: Never Smoker  . Smokeless tobacco: Never Used  . Alcohol use No     Allergies   Latex; Ace inhibitors; Penicillins; and Yellow dye   Review of Systems Review of Systems  Constitutional: Negative for chills and fever.  HENT: Negative for facial swelling and sore throat.   Respiratory: Negative for shortness of breath.   Cardiovascular: Negative for chest pain.  Gastrointestinal: Negative for abdominal pain, nausea and vomiting.  Genitourinary: Negative for dysuria.  Musculoskeletal: Positive for back pain.  Skin: Negative for rash and wound.  Neurological: Negative for dizziness, light-headedness and headaches.  Psychiatric/Behavioral: The patient is not nervous/anxious.      Physical Exam Updated Vital Signs BP 134/63 (BP Location: Left Arm)   Pulse 73   Temp 98.2 F (36.8 C)   Resp 16   SpO2 92%   Physical Exam  Constitutional: She appears well-developed and well-nourished. No distress.  HENT:  Head: Normocephalic and atraumatic.  Mouth/Throat: Oropharynx is clear and moist. No  oropharyngeal exudate.  Eyes: Pupils are equal, round, and reactive to light. Conjunctivae are normal. Right eye exhibits no discharge. Left eye exhibits no discharge. No scleral icterus.  Neck: Normal range of motion. Neck supple. No thyromegaly present.  Cardiovascular: Normal rate, regular rhythm, normal heart sounds and intact distal pulses.  Exam reveals no gallop and no friction rub.   No murmur heard. Pulmonary/Chest: Effort normal and breath sounds normal. No stridor. No respiratory distress. She has no wheezes. She has no rales.  Abdominal: Soft. Bowel sounds are normal. She exhibits no distension. There is no tenderness. There is no rebound and no guarding.  Musculoskeletal: She exhibits no edema.  Midline lumbar tenderness; no midline cervical or thoracic tenderness; no tenderness to bilateral hips, legs, shoulders, arms, chest  Lymphadenopathy:    She has no cervical adenopathy.  Neurological: She is alert. Coordination normal.  Normal sensation, 5/5 strength to all  4 extremities  Skin: Skin is warm and dry. No rash noted. She is not diaphoretic. No pallor.  Minor abrasion to R frontal scalp, no active bleeding  Psychiatric: She has a normal mood and affect.  Nursing note and vitals reviewed.    ED Treatments / Results  Labs (all labs ordered are listed, but only abnormal results are displayed) Labs Reviewed  BASIC METABOLIC PANEL - Abnormal; Notable for the following:       Result Value   Sodium 133 (*)    Chloride 97 (*)    Calcium 8.6 (*)    All other components within normal limits  CBC WITH DIFFERENTIAL/PLATELET - Abnormal; Notable for the following:    RBC 3.84 (*)    Hemoglobin 11.8 (*)    HCT 34.5 (*)    All other components within normal limits  URINALYSIS, ROUTINE W REFLEX MICROSCOPIC - Abnormal; Notable for the following:    Ketones, ur 5 (*)    All other components within normal limits  RAPID URINE DRUG SCREEN, HOSP PERFORMED - Abnormal; Notable for the  following:    Benzodiazepines POSITIVE (*)    All other components within normal limits  I-STAT CHEM 8, ED - Abnormal; Notable for the following:    Sodium 133 (*)    Chloride 94 (*)    Calcium, Ion 1.12 (*)    All other components within normal limits  ETHANOL  PROTIME-INR  APTT  HEMOGLOBIN A1C  LIPID PANEL  I-STAT TROPOININ, ED    EKG  EKG Interpretation None       Radiology Ct Angio Head W Or Wo Contrast  Result Date: 02/10/2017 CLINICAL DATA:  Initial evaluation for bone witnessed fall. Concern for lacunar infarct. EXAM: CT ANGIOGRAPHY HEAD AND NECK TECHNIQUE: Multidetector CT imaging of the head and neck was performed using the standard protocol during bolus administration of intravenous contrast. Multiplanar CT image reconstructions and MIPs were obtained to evaluate the vascular anatomy. Carotid stenosis measurements (when applicable) are obtained utilizing NASCET criteria, using the distal internal carotid diameter as the denominator. CONTRAST:  100 cc of Isovue 370. COMPARISON:  Prior CT from earlier the same day. FINDINGS: CTA NECK FINDINGS Aortic arch: Visualized aortic arch of normal caliber with normal branch pattern. Scattered atheromatous plaque present within the aortic arch and about the origin the great vessels without flow-limiting stenosis. Visualized subclavian artery is are widely patent. Right carotid system: Right common carotid artery tortuous proximally but widely patent to the bifurcation. Mild atheromatous plaque about the proximal right ICA without flow-limiting stenosis. Right ICA tortuous but patent to the skullbase without stenosis, dissection, or occlusion. Left carotid system: Left common carotid artery tortuous proximally but widely patent to the bifurcation. No significant atheromatous narrowing about the left bifurcation/proximal left ICA. Left ICA patent distally to the skullbase without stenosis, dissection, or occlusion. Vertebral arteries: Both of  the vertebral arteries arise from the subclavian arteries. Vertebral artery is tortuous proximally. Right vertebral artery dominant. Minimal focal irregularity at the bilateral V3 segments favored to be chronic and reflect short-segment mild FMD. Vertebral arteries patent within the neck without stenosis, dissection, or occlusion. Skeleton: No acute osseous abnormality. No worrisome lytic or blastic osseous lesions. Other neck: Soft tissues of the neck demonstrate no acute abnormality. Salivary glands normal. No adenopathy. Subcentimeter hypodense right thyroid nodule noted, of doubtful significance. Thyroid otherwise unremarkable. Upper chest: Visualized upper chest within normal limits. Partially visualized lungs are clear. Review of the MIP images confirms the above  findings CTA HEAD FINDINGS Anterior circulation: The petrous, cavernous, and supraclinoid segments widely patent without flow-limiting stenosis. Minimal atherosclerotic change for age within the carotid siphons. ICA termini widely patent. A1 segments patent bilaterally. Anterior communicating artery normal. Anterior cerebral artery is widely patent to their distal aspects. M1 segments patent without stenosis or occlusion. MCA bifurcations normal. No proximal M2 occlusion. Distal MCA branches well opacified and symmetric. Posterior circulation: Vertebral artery is widely patent to the vertebrobasilar junction without stenosis. Posterior inferior cerebral arteries patent bilaterally. Basilar artery widely patent. Superior cerebral arteries patent bilaterally. Both of the posterior cerebral arteries primarily supplied via the basilar artery. Right PCA patent to its distal aspect without stenosis. There is a short-segment moderate to severe proximal left P2 stenosis (series 9, image 96). Left PCA and widely patent distally. Venous sinuses: Patent. Anatomic variants: No significant anatomic variant. No aneurysm or vascular malformation. Delayed phase: No  pathologic enhancement. Review of the MIP images confirms the above findings IMPRESSION: 1. Negative CTA of the head and neck. No acute large vessel or proximal arterial branch occlusion. 2. Single short-segment moderate to severe proximal left P2 stenosis. No other high-grade or correctable stenosis identified within the major arterial vasculature of the head and neck. Relatively mild atheromatous disease for patient age. 3. Short-segment mild irregularity involving the V3 segments bilaterally, suspected to be related to mild FMD. 4. Diffuse tortuosity of the major arterial vasculature, suggesting chronic underlying hypertension. Electronically Signed   By: Jeannine Boga M.D.   On: 02/10/2017 23:57   Dg Chest 2 View  Result Date: 02/10/2017 CLINICAL DATA:  83 y/o F; altered mental status, chest pain, shortness of breath. EXAM: CHEST  2 VIEW COMPARISON:  04/02/2016 chest radiograph FINDINGS: Normal cardiac silhouette. Aortic atherosclerosis with calcification. Clear lungs. No pleural effusion or pneumothorax. Bones are unremarkable. IMPRESSION: No acute pulmonary process identified. Electronically Signed   By: Kristine Garbe M.D.   On: 02/10/2017 19:34   Ct Head Wo Contrast  Result Date: 02/10/2017 CLINICAL DATA:  Pain after fall EXAM: CT HEAD WITHOUT CONTRAST CT CERVICAL SPINE WITHOUT CONTRAST TECHNIQUE: Multidetector CT imaging of the head and cervical spine was performed following the standard protocol without intravenous contrast. Multiplanar CT image reconstructions of the cervical spine were also generated. COMPARISON:  April 02, 2016 FINDINGS: CT HEAD FINDINGS Brain: No subdural, epidural, or subarachnoid hemorrhage. No mass effect or midline shift. Ventricles and sulci are prominent stable. Cerebellum, brainstem, and basal cisterns are normal. White matter changes are identified. There is a lacunar infarct in the left caudate, present previously. It is slightly more prominent  today, probably due to difference in slice selection. There is a lacunar infarct in the right thalamus is more prominent today also, possibly due to difference in slice selection. No acute cortical ischemia or infarct identified. Vascular: No hyperdense vessel or unexpected calcification. Skull: Normal. Negative for fracture or focal lesion. Sinuses/Orbits: No acute finding. Other: None. CT CERVICAL SPINE FINDINGS Alignment: There is 2 mm of anterolisthesis of C7 versus T1 identified. Minimal anterolisthesis of T2 versus T3 identified. No other malalignment. Skull base and vertebrae: No acute fracture. No primary bone lesion or focal pathologic process. Soft tissues and spinal canal: No prevertebral fluid or swelling. No visible canal hematoma. Disc levels:  Multilevel degenerative disc disease identified. Upper chest: Negative. Other: No other abnormalities. IMPRESSION: 1. No acute traumatic intracranial abnormality. 2. There is a lacunar infarct in the right thalamus which was not appreciated on the previous study. It  is possible the difference could be due to slice selection. An MRI could better assess for subtle acute infarcts. No other definitive acute abnormalities seen on today's study. 3. No fracture or traumatic malalignment in the cervical spine. Degenerative changes. Electronically Signed   By: Dorise Bullion III M.D   On: 02/10/2017 18:31   Ct Angio Neck W And/or Wo Contrast  Result Date: 02/10/2017 CLINICAL DATA:  Initial evaluation for bone witnessed fall. Concern for lacunar infarct. EXAM: CT ANGIOGRAPHY HEAD AND NECK TECHNIQUE: Multidetector CT imaging of the head and neck was performed using the standard protocol during bolus administration of intravenous contrast. Multiplanar CT image reconstructions and MIPs were obtained to evaluate the vascular anatomy. Carotid stenosis measurements (when applicable) are obtained utilizing NASCET criteria, using the distal internal carotid diameter as the  denominator. CONTRAST:  100 cc of Isovue 370. COMPARISON:  Prior CT from earlier the same day. FINDINGS: CTA NECK FINDINGS Aortic arch: Visualized aortic arch of normal caliber with normal branch pattern. Scattered atheromatous plaque present within the aortic arch and about the origin the great vessels without flow-limiting stenosis. Visualized subclavian artery is are widely patent. Right carotid system: Right common carotid artery tortuous proximally but widely patent to the bifurcation. Mild atheromatous plaque about the proximal right ICA without flow-limiting stenosis. Right ICA tortuous but patent to the skullbase without stenosis, dissection, or occlusion. Left carotid system: Left common carotid artery tortuous proximally but widely patent to the bifurcation. No significant atheromatous narrowing about the left bifurcation/proximal left ICA. Left ICA patent distally to the skullbase without stenosis, dissection, or occlusion. Vertebral arteries: Both of the vertebral arteries arise from the subclavian arteries. Vertebral artery is tortuous proximally. Right vertebral artery dominant. Minimal focal irregularity at the bilateral V3 segments favored to be chronic and reflect short-segment mild FMD. Vertebral arteries patent within the neck without stenosis, dissection, or occlusion. Skeleton: No acute osseous abnormality. No worrisome lytic or blastic osseous lesions. Other neck: Soft tissues of the neck demonstrate no acute abnormality. Salivary glands normal. No adenopathy. Subcentimeter hypodense right thyroid nodule noted, of doubtful significance. Thyroid otherwise unremarkable. Upper chest: Visualized upper chest within normal limits. Partially visualized lungs are clear. Review of the MIP images confirms the above findings CTA HEAD FINDINGS Anterior circulation: The petrous, cavernous, and supraclinoid segments widely patent without flow-limiting stenosis. Minimal atherosclerotic change for age within  the carotid siphons. ICA termini widely patent. A1 segments patent bilaterally. Anterior communicating artery normal. Anterior cerebral artery is widely patent to their distal aspects. M1 segments patent without stenosis or occlusion. MCA bifurcations normal. No proximal M2 occlusion. Distal MCA branches well opacified and symmetric. Posterior circulation: Vertebral artery is widely patent to the vertebrobasilar junction without stenosis. Posterior inferior cerebral arteries patent bilaterally. Basilar artery widely patent. Superior cerebral arteries patent bilaterally. Both of the posterior cerebral arteries primarily supplied via the basilar artery. Right PCA patent to its distal aspect without stenosis. There is a short-segment moderate to severe proximal left P2 stenosis (series 9, image 96). Left PCA and widely patent distally. Venous sinuses: Patent. Anatomic variants: No significant anatomic variant. No aneurysm or vascular malformation. Delayed phase: No pathologic enhancement. Review of the MIP images confirms the above findings IMPRESSION: 1. Negative CTA of the head and neck. No acute large vessel or proximal arterial branch occlusion. 2. Single short-segment moderate to severe proximal left P2 stenosis. No other high-grade or correctable stenosis identified within the major arterial vasculature of the head and neck. Relatively mild atheromatous  disease for patient age. 3. Short-segment mild irregularity involving the V3 segments bilaterally, suspected to be related to mild FMD. 4. Diffuse tortuosity of the major arterial vasculature, suggesting chronic underlying hypertension. Electronically Signed   By: Jeannine Boga M.D.   On: 02/10/2017 23:57   Ct Cervical Spine Wo Contrast  Result Date: 02/10/2017 CLINICAL DATA:  Pain after fall EXAM: CT HEAD WITHOUT CONTRAST CT CERVICAL SPINE WITHOUT CONTRAST TECHNIQUE: Multidetector CT imaging of the head and cervical spine was performed following the  standard protocol without intravenous contrast. Multiplanar CT image reconstructions of the cervical spine were also generated. COMPARISON:  April 02, 2016 FINDINGS: CT HEAD FINDINGS Brain: No subdural, epidural, or subarachnoid hemorrhage. No mass effect or midline shift. Ventricles and sulci are prominent stable. Cerebellum, brainstem, and basal cisterns are normal. White matter changes are identified. There is a lacunar infarct in the left caudate, present previously. It is slightly more prominent today, probably due to difference in slice selection. There is a lacunar infarct in the right thalamus is more prominent today also, possibly due to difference in slice selection. No acute cortical ischemia or infarct identified. Vascular: No hyperdense vessel or unexpected calcification. Skull: Normal. Negative for fracture or focal lesion. Sinuses/Orbits: No acute finding. Other: None. CT CERVICAL SPINE FINDINGS Alignment: There is 2 mm of anterolisthesis of C7 versus T1 identified. Minimal anterolisthesis of T2 versus T3 identified. No other malalignment. Skull base and vertebrae: No acute fracture. No primary bone lesion or focal pathologic process. Soft tissues and spinal canal: No prevertebral fluid or swelling. No visible canal hematoma. Disc levels:  Multilevel degenerative disc disease identified. Upper chest: Negative. Other: No other abnormalities. IMPRESSION: 1. No acute traumatic intracranial abnormality. 2. There is a lacunar infarct in the right thalamus which was not appreciated on the previous study. It is possible the difference could be due to slice selection. An MRI could better assess for subtle acute infarcts. No other definitive acute abnormalities seen on today's study. 3. No fracture or traumatic malalignment in the cervical spine. Degenerative changes. Electronically Signed   By: Dorise Bullion III M.D   On: 02/10/2017 18:31   Ct Lumbar Spine Wo Contrast  Result Date:  02/10/2017 CLINICAL DATA:  83 y/o  F; fall with lower back pain. EXAM: CT LUMBAR SPINE WITHOUT CONTRAST TECHNIQUE: Multidetector CT imaging of the lumbar spine was performed without intravenous contrast administration. Multiplanar CT image reconstructions were also generated. COMPARISON:  None. FINDINGS: Segmentation: 5 lumbar type vertebrae. Alignment: Normal. Vertebrae: L2 acute superior endplate fracture with moderate 50% loss of height and 7 mm retropulsion of the superior endplate. 8 mm anterior epidural soft tissue thickening at L2 level, probably a small epidural hematoma, with resultant moderate canal stenosis. No other acute fracture identified. Paraspinal and other soft tissues: Aortic and iliofemoral calcific atherosclerosis. Disc levels: Mild L3-4, moderate to severe L4-5, and moderate L5-S1 disc space narrowing. L2 through L5 spinous process articulation may represent Baastrup's disease. L2 through L5 small disc bulges. No high-grade bony canal stenosis or foraminal narrowing. IMPRESSION: L2 acute superior endplate fracture with 85% loss of height and 7 mm retropulsion of superior endplate. 8 mm anterior epidural soft tissue thickening at L2 level, probably a small epidural hematoma, with resultant moderate canal stenosis. These results were called by telephone at the time of interpretation on 02/10/2017 at 8:53 pm to Dr. Eliezer Mccoy , who verbally acknowledged these results. Electronically Signed   By: Kristine Garbe M.D.   On: 02/10/2017  20:56    Procedures Procedures (including critical care time)  Medications Ordered in ED Medications  iopamidol (ISOVUE-370) 76 % injection (not administered)  clorazepate (TRANXENE) tablet 3.75 mg (not administered)  divalproex (DEPAKOTE) DR tablet 250 mg (not administered)  memantine (NAMENDA) tablet 10 mg (not administered)  PARoxetine (PAXIL) tablet 20 mg (not administered)  donepezil (ARICEPT) tablet 20 mg (not administered)  irbesartan  (AVAPRO) tablet 150 mg (not administered)  vitamin B-12 (CYANOCOBALAMIN) tablet 1,000 mcg (not administered)  loratadine (CLARITIN) tablet 10 mg (not administered)  Magnesium Gluconate TABS 550 mg (not administered)  PRESERVISION/LUTEIN CAPS (not administered)  oxyCODONE-acetaminophen (PERCOCET/ROXICET) 5-325 MG per tablet 1 tablet (not administered)  morphine 2 MG/ML injection 1 mg (1 mg Intravenous Given 02/11/17 0116)  0.9 %  sodium chloride infusion (not administered)  LORazepam (ATIVAN) tablet 0.5 mg (not administered)   stroke: mapping our early stages of recovery book (not administered)  acetaminophen (TYLENOL) tablet 650 mg (not administered)    Or  acetaminophen (TYLENOL) solution 650 mg (not administered)    Or  acetaminophen (TYLENOL) suppository 650 mg (not administered)  polyethylene glycol (MIRALAX / GLYCOLAX) packet 17 g (not administered)  iopamidol (ISOVUE-370) 76 % injection 100 mL (100 mLs Intravenous Contrast Given 02/10/17 [phone]     Initial Impression / Assessment and Plan / ED Course  I have reviewed the triage vital signs and the nursing notes.  Pertinent labs & imaging results that were available during my care of the patient were reviewed by me and considered in my medical decision making (see chart for details).     CT lumbar spine shows L2 acute superior endplate fracture with 60% loss of height and 7 mm retropulsion of superior endplate. 8 mm anterior epidural soft tissue thickening at L2 level, probably a small epidural hematoma, with resultant moderate canal stenosis. CT of the head shows possible lacunar infarct that was not seen on previous study. No other acute findings on imaging. I consulted neurology who advised CT angiogram head and neck this evening, as MRI unavailable, and admission for MRI and echocardiogram tomorrow for unwitnessed fall/stroke workup. I spoke with Dr. Ronnald Ramp, neurosurgeon who advised a TLSO brace for patient's compression fracture and  follow-up to him outpatient. I consulted [facility] and spoke with Dr. Blaine Hamper who will admit the patient telemetry for further evaluation and treatment. Patient also evaluated by Dr. Sherry Ruffing who guided the patient's management and agrees with plan.  Final Clinical Impressions(s) / ED Diagnoses   Final diagnoses:  Fall, initial encounter  Closed compression fracture of second lumbar vertebra, initial encounter [facility])    New Prescriptions New Prescriptions   No medications on file      Frederica Kuster, Hershal Coria 02/11/17 0158    Tegeler, Gwenyth Allegra, MD 02/11/17 [phone]

## 2017-02-10 NOTE — ED Notes (Signed)
Bed: [facility] Expected date:  Expected time:  Means of arrival:  Comments: EMS-back pain from fall-triage

## 2017-02-11 ENCOUNTER — Inpatient Hospital Stay (HOSPITAL_COMMUNITY): Payer: Medicare Other

## 2017-02-11 ENCOUNTER — Encounter (HOSPITAL_COMMUNITY): Payer: Self-pay

## 2017-02-11 DIAGNOSIS — G8921 Chronic pain due to trauma: Secondary | ICD-10-CM | POA: Diagnosis not present

## 2017-02-11 DIAGNOSIS — S064X0A Epidural hemorrhage without loss of consciousness, initial encounter: Secondary | ICD-10-CM

## 2017-02-11 DIAGNOSIS — F0151 Vascular dementia with behavioral disturbance: Secondary | ICD-10-CM | POA: Diagnosis not present

## 2017-02-11 DIAGNOSIS — S32029A Unspecified fracture of second lumbar vertebra, initial encounter for closed fracture: Secondary | ICD-10-CM | POA: Diagnosis present

## 2017-02-11 DIAGNOSIS — I10 Essential (primary) hypertension: Secondary | ICD-10-CM

## 2017-02-11 DIAGNOSIS — R2681 Unsteadiness on feet: Secondary | ICD-10-CM | POA: Diagnosis not present

## 2017-02-11 DIAGNOSIS — S0990XA Unspecified injury of head, initial encounter: Secondary | ICD-10-CM | POA: Diagnosis not present

## 2017-02-11 DIAGNOSIS — H3530 Unspecified macular degeneration: Secondary | ICD-10-CM | POA: Diagnosis not present

## 2017-02-11 DIAGNOSIS — F0390 Unspecified dementia without behavioral disturbance: Secondary | ICD-10-CM | POA: Diagnosis not present

## 2017-02-11 DIAGNOSIS — Z91040 Latex allergy status: Secondary | ICD-10-CM | POA: Diagnosis not present

## 2017-02-11 DIAGNOSIS — Z888 Allergy status to other drugs, medicaments and biological substances status: Secondary | ICD-10-CM | POA: Diagnosis not present

## 2017-02-11 DIAGNOSIS — W19XXXD Unspecified fall, subsequent encounter: Secondary | ICD-10-CM | POA: Diagnosis not present

## 2017-02-11 DIAGNOSIS — Z880 Allergy status to penicillin: Secondary | ICD-10-CM | POA: Diagnosis not present

## 2017-02-11 DIAGNOSIS — Z79899 Other long term (current) drug therapy: Secondary | ICD-10-CM | POA: Diagnosis not present

## 2017-02-11 DIAGNOSIS — F0150 Vascular dementia without behavioral disturbance: Secondary | ICD-10-CM | POA: Diagnosis not present

## 2017-02-11 DIAGNOSIS — Z96641 Presence of right artificial hip joint: Secondary | ICD-10-CM | POA: Diagnosis present

## 2017-02-11 DIAGNOSIS — F5101 Primary insomnia: Secondary | ICD-10-CM | POA: Diagnosis not present

## 2017-02-11 DIAGNOSIS — S32028A Other fracture of second lumbar vertebra, initial encounter for closed fracture: Secondary | ICD-10-CM | POA: Diagnosis present

## 2017-02-11 DIAGNOSIS — M545 Low back pain: Secondary | ICD-10-CM | POA: Diagnosis not present

## 2017-02-11 DIAGNOSIS — M6281 Muscle weakness (generalized): Secondary | ICD-10-CM | POA: Diagnosis not present

## 2017-02-11 DIAGNOSIS — F3289 Other specified depressive episodes: Secondary | ICD-10-CM | POA: Diagnosis not present

## 2017-02-11 DIAGNOSIS — S32028D Other fracture of second lumbar vertebra, subsequent encounter for fracture with routine healing: Secondary | ICD-10-CM

## 2017-02-11 DIAGNOSIS — Z7982 Long term (current) use of aspirin: Secondary | ICD-10-CM | POA: Diagnosis not present

## 2017-02-11 DIAGNOSIS — S064X9A Epidural hemorrhage with loss of consciousness of unspecified duration, initial encounter: Secondary | ICD-10-CM | POA: Diagnosis present

## 2017-02-11 DIAGNOSIS — S22029D Unspecified fracture of second thoracic vertebra, subsequent encounter for fracture with routine healing: Secondary | ICD-10-CM | POA: Diagnosis not present

## 2017-02-11 DIAGNOSIS — I4510 Unspecified right bundle-branch block: Secondary | ICD-10-CM | POA: Diagnosis present

## 2017-02-11 DIAGNOSIS — S064X0D Epidural hemorrhage without loss of consciousness, subsequent encounter: Secondary | ICD-10-CM | POA: Diagnosis not present

## 2017-02-11 DIAGNOSIS — S32029D Unspecified fracture of second lumbar vertebra, subsequent encounter for fracture with routine healing: Secondary | ICD-10-CM | POA: Diagnosis not present

## 2017-02-11 DIAGNOSIS — S32021A Stable burst fracture of second lumbar vertebra, initial encounter for closed fracture: Secondary | ICD-10-CM | POA: Diagnosis not present

## 2017-02-11 DIAGNOSIS — G309 Alzheimer's disease, unspecified: Secondary | ICD-10-CM | POA: Diagnosis present

## 2017-02-11 DIAGNOSIS — S3992XA Unspecified injury of lower back, initial encounter: Secondary | ICD-10-CM | POA: Diagnosis not present

## 2017-02-11 DIAGNOSIS — Z9181 History of falling: Secondary | ICD-10-CM | POA: Diagnosis not present

## 2017-02-11 DIAGNOSIS — D510 Vitamin B12 deficiency anemia due to intrinsic factor deficiency: Secondary | ICD-10-CM | POA: Diagnosis not present

## 2017-02-11 DIAGNOSIS — M48061 Spinal stenosis, lumbar region without neurogenic claudication: Secondary | ICD-10-CM | POA: Diagnosis present

## 2017-02-11 DIAGNOSIS — S064XAA Epidural hemorrhage with loss of consciousness status unknown, initial encounter: Secondary | ICD-10-CM | POA: Diagnosis present

## 2017-02-11 DIAGNOSIS — I2510 Atherosclerotic heart disease of native coronary artery without angina pectoris: Secondary | ICD-10-CM | POA: Diagnosis not present

## 2017-02-11 DIAGNOSIS — W19XXXA Unspecified fall, initial encounter: Secondary | ICD-10-CM | POA: Diagnosis not present

## 2017-02-11 DIAGNOSIS — S32020D Wedge compression fracture of second lumbar vertebra, subsequent encounter for fracture with routine healing: Secondary | ICD-10-CM | POA: Diagnosis not present

## 2017-02-11 DIAGNOSIS — F418 Other specified anxiety disorders: Secondary | ICD-10-CM | POA: Diagnosis not present

## 2017-02-11 DIAGNOSIS — M549 Dorsalgia, unspecified: Secondary | ICD-10-CM | POA: Diagnosis not present

## 2017-02-11 DIAGNOSIS — W1830XA Fall on same level, unspecified, initial encounter: Secondary | ICD-10-CM | POA: Diagnosis present

## 2017-02-11 DIAGNOSIS — J3089 Other allergic rhinitis: Secondary | ICD-10-CM | POA: Diagnosis not present

## 2017-02-11 DIAGNOSIS — F0280 Dementia in other diseases classified elsewhere without behavioral disturbance: Secondary | ICD-10-CM | POA: Diagnosis present

## 2017-02-11 DIAGNOSIS — E612 Magnesium deficiency: Secondary | ICD-10-CM | POA: Diagnosis not present

## 2017-02-11 DIAGNOSIS — Z66 Do not resuscitate: Secondary | ICD-10-CM | POA: Diagnosis present

## 2017-02-11 DIAGNOSIS — I639 Cerebral infarction, unspecified: Secondary | ICD-10-CM | POA: Diagnosis not present

## 2017-02-11 DIAGNOSIS — I959 Hypotension, unspecified: Secondary | ICD-10-CM | POA: Diagnosis not present

## 2017-02-11 DIAGNOSIS — K5900 Constipation, unspecified: Secondary | ICD-10-CM | POA: Diagnosis not present

## 2017-02-11 DIAGNOSIS — E538 Deficiency of other specified B group vitamins: Secondary | ICD-10-CM | POA: Diagnosis present

## 2017-02-11 DIAGNOSIS — Z91048 Other nonmedicinal substance allergy status: Secondary | ICD-10-CM | POA: Diagnosis not present

## 2017-02-11 DIAGNOSIS — R278 Other lack of coordination: Secondary | ICD-10-CM | POA: Diagnosis not present

## 2017-02-11 LAB — LIPID PANEL
Cholesterol: 176 mg/dL (ref 0–200)
HDL: 73 mg/dL (ref >40)
LDL CALC: 92 mg/dL (ref 0–99)
TRIGLYCERIDES: 56 mg/dL (ref <150)
Total CHOL/HDL Ratio: 2.4 RATIO
VLDL: 11 mg/dL (ref 0–40)

## 2017-02-11 LAB — MRSA PCR SCREENING: MRSA BY PCR: NEGATIVE

## 2017-02-11 MED ORDER — PAROXETINE HCL 20 MG PO TABS
20.0000 mg | ORAL_TABLET | Freq: Every day | ORAL | Status: DC
  Administered 2017-02-11 – 2017-02-15 (×5): 20 mg via ORAL
  Filled 2017-02-11 (×5): qty 1

## 2017-02-11 MED ORDER — ACETAMINOPHEN 160 MG/5ML PO SOLN: 650.0000 mg | ORAL | Status: DC | PRN

## 2017-02-11 MED ORDER — DIVALPROEX SODIUM 250 MG PO DR TAB
250.0000 mg | DELAYED_RELEASE_TABLET | Freq: Two times a day (BID) | ORAL | Status: DC
  Administered 2017-02-11 – 2017-02-15 (×10): 250 mg via ORAL
  Filled 2017-02-11 (×10): qty 1

## 2017-02-11 MED ORDER — ACETAMINOPHEN 325 MG PO TABS
650.0000 mg | ORAL_TABLET | ORAL | Status: DC | PRN
  Administered 2017-02-12: 650 mg via ORAL
  Filled 2017-02-11: qty 2

## 2017-02-11 MED ORDER — STROKE: EARLY STAGES OF RECOVERY BOOK
Freq: Once | Status: DC
  Filled 2017-02-11: qty 1

## 2017-02-11 MED ORDER — MORPHINE SULFATE (PF) 2 MG/ML IV SOLN
1.0000 mg | INTRAVENOUS | Status: DC | PRN
  Administered 2017-02-11: 1 mg via INTRAVENOUS

## 2017-02-11 MED ORDER — MORPHINE SULFATE (PF) 4 MG/ML IV SOLN
1.0000 mg | INTRAVENOUS | Status: DC | PRN
  Administered 2017-02-11 – 2017-02-15 (×3): 1 mg via INTRAVENOUS
  Filled 2017-02-11 (×3): qty 1

## 2017-02-11 MED ORDER — PAROXETINE HCL 10 MG PO TABS: 10.0000 mg | ORAL_TABLET | Freq: Every day | ORAL | Status: DC

## 2017-02-11 MED ORDER — ACETAMINOPHEN 650 MG RE SUPP: 650.0000 mg | RECTAL | Status: DC | PRN

## 2017-02-11 MED ORDER — SODIUM CHLORIDE 0.9 % IV SOLN
INTRAVENOUS | Status: DC
  Administered 2017-02-11: 02:00:00 via INTRAVENOUS

## 2017-02-11 MED ORDER — LORATADINE 10 MG PO TABS
10.0000 mg | ORAL_TABLET | Freq: Every day | ORAL | Status: DC
  Administered 2017-02-11 – 2017-02-15 (×5): 10 mg via ORAL
  Filled 2017-02-11 (×5): qty 1

## 2017-02-11 MED ORDER — OXYCODONE-ACETAMINOPHEN 5-325 MG PO TABS
1.0000 | ORAL_TABLET | ORAL | Status: DC | PRN
  Administered 2017-02-11 – 2017-02-14 (×10): 1 via ORAL
  Filled 2017-02-11 (×10): qty 1

## 2017-02-11 MED ORDER — POLYETHYLENE GLYCOL 3350 17 G PO PACK: 17.0000 g | PACK | Freq: Every day | ORAL | Status: DC | PRN

## 2017-02-11 MED ORDER — CLORAZEPATE DIPOTASSIUM 3.75 MG PO TABS: 3.7500 mg | ORAL_TABLET | Freq: Two times a day (BID) | ORAL | Status: DC | PRN

## 2017-02-11 MED ORDER — LORAZEPAM 0.5 MG PO TABS
0.5000 mg | ORAL_TABLET | Freq: Three times a day (TID) | ORAL | Status: DC
  Administered 2017-02-11 – 2017-02-15 (×14): 0.5 mg via ORAL
  Filled 2017-02-11 (×15): qty 1

## 2017-02-11 MED ORDER — IRBESARTAN 150 MG PO TABS
150.0000 mg | ORAL_TABLET | Freq: Every day | ORAL | Status: DC
  Administered 2017-02-11 – 2017-02-12 (×2): 150 mg via ORAL
  Filled 2017-02-11 (×2): qty 1

## 2017-02-11 MED ORDER — MEMANTINE HCL 10 MG PO TABS
10.0000 mg | ORAL_TABLET | Freq: Every day | ORAL | Status: DC
  Administered 2017-02-11 – 2017-02-15 (×5): 10 mg via ORAL
  Filled 2017-02-11 (×5): qty 1

## 2017-02-11 MED ORDER — DONEPEZIL HCL 5 MG PO TABS
20.0000 mg | ORAL_TABLET | Freq: Every day | ORAL | Status: DC
  Administered 2017-02-11 – 2017-02-14 (×5): 20 mg via ORAL
  Filled 2017-02-11 (×6): qty 4

## 2017-02-11 MED ORDER — LORAZEPAM 1 MG PO TABS: 1.0000 mg | ORAL_TABLET | Freq: Three times a day (TID) | ORAL | Status: DC

## 2017-02-11 MED ORDER — PROSIGHT PO TABS
ORAL_TABLET | Freq: Two times a day (BID) | ORAL | Status: DC
  Administered 2017-02-11: 2 via ORAL
  Administered 2017-02-11 – 2017-02-15 (×9): 1 via ORAL
  Filled 2017-02-11 (×10): qty 1

## 2017-02-11 MED ORDER — MORPHINE SULFATE (PF) 2 MG/ML IV SOLN
2.0000 mg | Freq: Once | INTRAVENOUS | Status: DC
  Filled 2017-02-11: qty 1

## 2017-02-11 MED ORDER — HYDRALAZINE HCL 20 MG/ML IJ SOLN: 5.0000 mg | INTRAMUSCULAR | Status: DC | PRN

## 2017-02-11 MED ORDER — MAGNESIUM GLUCONATE 500 MG PO TABS
500.0000 mg | ORAL_TABLET | Freq: Every day | ORAL | Status: DC
  Administered 2017-02-11 – 2017-02-15 (×5): 500 mg via ORAL
  Filled 2017-02-11 (×5): qty 1

## 2017-02-11 MED ORDER — VITAMIN B-12 1000 MCG PO TABS
1000.0000 ug | ORAL_TABLET | Freq: Every day | ORAL | Status: DC
  Administered 2017-02-11 – 2017-02-15 (×5): 1000 ug via ORAL
  Filled 2017-02-11 (×5): qty 1

## 2017-02-11 NOTE — Progress Notes (Signed)
PT Cancellation Note  Patient Details Name: GEORGANN BRAMBLE MRN: [id number] DOB: 1928-11-16   Cancelled Treatment:    Reason Eval/Treat Not Completed: Other (comment) (awaiting TLSO prior to mobilizing pt, spoke with RN who states [employer] aware and to bring brace)   Elick Aguilera,KATHrine E 02/11/2017, 10:15 AM Carmelia Bake, PT, DPT 02/11/2017 Pager: [phone]

## 2017-02-11 NOTE — Progress Notes (Signed)
Assumed care from previous nurse. Agree with earlier assessment. Pt up with PT and to chair with back brace. Eulas Post, RN

## 2017-02-11 NOTE — Progress Notes (Signed)
PROGRESS NOTE                                                                                                                                                                                                             Patient Demographics:    Pamela Callahan, is a 83 y.o. female, DOB - June 09, 1929, SHF:[id number]  Admit date - 02/10/2017   Admitting Physician Ivor Costa, MD  Outpatient Primary MD for the patient is Plotnikov, Evie Lacks, MD  LOS - 0  Outpatient Specialists:none  Chief Complaint  Patient presents with  . Fall  . Back Pain       Brief Narrative   83 year old severely demented female with hypertension, hyperlipidemia, anxiety, depression and GERD presented with fall at the memory care unit and complained of back pain. Reportedly she fell while she was working to fix piece of furniture. She complained of pain in her back. No reported loss of consciousness or head injury. No fever, chills, dizziness, chest pain, shortness of breath, abdominal pain, nausea, vomiting, bowel or urinary symptoms. In the ED vitals were stable. CT scan of the lumbar spine showed L2 acute fracture with 50% loss of height and anterior epidural soft tissue thickening suggestive of epidural hematoma causing moderate canal stenosis. CT of the head and cervical spine showed a new lacunar infarct in the right thalamus. Patient admitted to hospitalist service, neurology and neurosurgery consulted.   Subjective:   Patient denies any pain at present. She is very confused.   Assessment  & Plan :    Principal Problem:  Fall with acute L2 compression fracture Has associated retropulsion and moderate canal stenosis. Imaging reviewed by neurosurgeon and considered not to be a surgical candidate. Recommended applying TLSO brace immobilizing her on it. Recommended that she does not need the brace while lying down and can be applied when she is sitting  and moving around. She should follow-up with him in 2 weeks after discharge. Once she has the brace applied and all recommend will order upright x-rays on the brace. PT evaluation pending. Pain control with when necessary Percocet.  Active Problems: ? Acute lacunar stroke MRI brain negative for acute stroke. No need for further stroke workup. Neurology signed off.    Essential hypertension Stable. Continue ARB.  Severe dementia Continue Namenda and Aricept  Anxiety and depression Stable.  Continue home medications.     Code Status : DO NOT RESUSCITATE  Family Communication  : Daughter at bedside  Disposition Plan  : Possibly return to memory care unit with PT on 7/14  Barriers For Discharge : Active symptoms  Consults  :   Neurology Neurosurgery (Dr. Ronnald Ramp)  Procedures  :  CT head, CT angiogram of the head and neck CT lumbar spine MRI brain  DVT Prophylaxis  :  SCDs  Lab Results  Component Value Date   PLT 163 02/10/2017    Antibiotics  :   Anti-infectives    None        Objective:   Vitals:   02/10/17 2246 02/11/17 0103 02/11/17 0223 02/11/17 1044  BP: 138/72 134/63 130/64 (!) 116/54  Pulse: 62 73 83 62  Resp: 17 16 16 15   Temp:   99 F (37.2 C) 98.2 F (36.8 C)  TempSrc:   Oral Oral  SpO2: 100% 92% 100% 97%  Weight:   65 kg (143 lb 4.8 oz)   Height:   5\' 10"  (1.778 m)     Wt Readings from Last 3 Encounters:  02/11/17 65 kg (143 lb 4.8 oz)  04/14/16 68.9 kg (152 lb)  04/02/16 68.9 kg (152 lb)     Intake/Output Summary (Last 24 hours) at 02/11/17 1223 Last data filed at 02/11/17 0900  Gross per 24 hour  Intake           503.75 ml  Output                0 ml  Net           503.75 ml     Physical Exam  Gen: Elderly female, confused, not in distress HEENT:  moist mucosa, supple neck Chest: clear b/l, no added sounds CVS: N S1&S2, no murmurs,  GI: soft, NT, ND, BS+ Musculoskeletal: warm, no edema, tenderness to pressure over lower  back CNS: AAOX1, and all extremities    Data Review:    CBC  Recent Labs Lab 02/10/17 1827 02/10/17 1927  WBC 8.6  --   HGB 11.8* 12.2  HCT 34.5* 36.0  PLT 163  --   MCV 89.8  --   MCH 30.7  --   MCHC 34.2  --   RDW 13.6  --   LYMPHSABS 0.9  --   MONOABS 0.7  --   EOSABS 0.0  --   BASOSABS 0.0  --     Chemistries   Recent Labs Lab 02/10/17 1827 02/10/17 1927  NA 133* 133*  K 4.1 4.0  CL 97* 94*  CO2 27  --   GLUCOSE 97 92  BUN 16 14  CREATININE 0.69 0.70  CALCIUM 8.6*  --    ------------------------------------------------------------------------------------------------------------------  Recent Labs  02/11/17 0448  CHOL 176  HDL 73  LDLCALC 92  TRIG 56  CHOLHDL 2.4    No results found for: HGBA1C ------------------------------------------------------------------------------------------------------------------ No results for input(s): TSH, T4TOTAL, T3FREE, THYROIDAB in the last 72 hours.  Invalid input(s): FREET3 ------------------------------------------------------------------------------------------------------------------ No results for input(s): VITAMINB12, FOLATE, FERRITIN, TIBC, IRON, RETICCTPCT in the last 72 hours.  Coagulation profile  Recent Labs Lab 02/10/17 1910  INR 1.05    No results for input(s): DDIMER in the last 72 hours.  Cardiac Enzymes No results for input(s): CKMB, TROPONINI, MYOGLOBIN in the last 168 hours.  Invalid input(s): CK ------------------------------------------------------------------------------------------------------------------ No results found for: BNP  Inpatient Medications  Scheduled Meds: .  stroke: mapping  our early stages of recovery book   Does not apply Once  . divalproex  250 mg Oral BID  . donepezil  20 mg Oral QHS  . irbesartan  150 mg Oral Daily  . loratadine  10 mg Oral Daily  . LORazepam  0.5 mg Oral TID  . magnesium gluconate  500 mg Oral Daily  . memantine  10 mg Oral Daily    . multivitamin   Oral BID  . PARoxetine  20 mg Oral Daily  . vitamin B-12  1,000 mcg Oral Daily   Continuous Infusions: . sodium chloride 75 mL/hr at 02/11/17 0229   PRN Meds:.acetaminophen **OR** acetaminophen (TYLENOL) oral liquid 160 mg/5 mL **OR** acetaminophen, clorazepate, hydrALAZINE, morphine injection, oxyCODONE-acetaminophen, polyethylene glycol  Micro Results Recent Results (from the past 240 hour(s))  MRSA PCR Screening     Status: None   Collection Time: 02/11/17  2:51 AM  Result Value Ref Range Status   MRSA by PCR NEGATIVE NEGATIVE Final    Comment:        The GeneXpert MRSA Assay (FDA approved for NASAL specimens only), is one component of a comprehensive MRSA colonization surveillance program. It is not intended to diagnose MRSA infection nor to guide or monitor treatment for MRSA infections.     Radiology Reports Ct Angio Head W Or Wo Contrast  Result Date: 02/10/2017 CLINICAL DATA:  Initial evaluation for bone witnessed fall. Concern for lacunar infarct. EXAM: CT ANGIOGRAPHY HEAD AND NECK TECHNIQUE: Multidetector CT imaging of the head and neck was performed using the standard protocol during bolus administration of intravenous contrast. Multiplanar CT image reconstructions and MIPs were obtained to evaluate the vascular anatomy. Carotid stenosis measurements (when applicable) are obtained utilizing NASCET criteria, using the distal internal carotid diameter as the denominator. CONTRAST:  100 cc of Isovue 370. COMPARISON:  Prior CT from earlier the same day. FINDINGS: CTA NECK FINDINGS Aortic arch: Visualized aortic arch of normal caliber with normal branch pattern. Scattered atheromatous plaque present within the aortic arch and about the origin the great vessels without flow-limiting stenosis. Visualized subclavian artery is are widely patent. Right carotid system: Right common carotid artery tortuous proximally but widely patent to the bifurcation. Mild  atheromatous plaque about the proximal right ICA without flow-limiting stenosis. Right ICA tortuous but patent to the skullbase without stenosis, dissection, or occlusion. Left carotid system: Left common carotid artery tortuous proximally but widely patent to the bifurcation. No significant atheromatous narrowing about the left bifurcation/proximal left ICA. Left ICA patent distally to the skullbase without stenosis, dissection, or occlusion. Vertebral arteries: Both of the vertebral arteries arise from the subclavian arteries. Vertebral artery is tortuous proximally. Right vertebral artery dominant. Minimal focal irregularity at the bilateral V3 segments favored to be chronic and reflect short-segment mild FMD. Vertebral arteries patent within the neck without stenosis, dissection, or occlusion. Skeleton: No acute osseous abnormality. No worrisome lytic or blastic osseous lesions. Other neck: Soft tissues of the neck demonstrate no acute abnormality. Salivary glands normal. No adenopathy. Subcentimeter hypodense right thyroid nodule noted, of doubtful significance. Thyroid otherwise unremarkable. Upper chest: Visualized upper chest within normal limits. Partially visualized lungs are clear. Review of the MIP images confirms the above findings CTA HEAD FINDINGS Anterior circulation: The petrous, cavernous, and supraclinoid segments widely patent without flow-limiting stenosis. Minimal atherosclerotic change for age within the carotid siphons. ICA termini widely patent. A1 segments patent bilaterally. Anterior communicating artery normal. Anterior cerebral artery is widely patent to their distal aspects. M1  segments patent without stenosis or occlusion. MCA bifurcations normal. No proximal M2 occlusion. Distal MCA branches well opacified and symmetric. Posterior circulation: Vertebral artery is widely patent to the vertebrobasilar junction without stenosis. Posterior inferior cerebral arteries patent bilaterally.  Basilar artery widely patent. Superior cerebral arteries patent bilaterally. Both of the posterior cerebral arteries primarily supplied via the basilar artery. Right PCA patent to its distal aspect without stenosis. There is a short-segment moderate to severe proximal left P2 stenosis (series 9, image 96). Left PCA and widely patent distally. Venous sinuses: Patent. Anatomic variants: No significant anatomic variant. No aneurysm or vascular malformation. Delayed phase: No pathologic enhancement. Review of the MIP images confirms the above findings IMPRESSION: 1. Negative CTA of the head and neck. No acute large vessel or proximal arterial branch occlusion. 2. Single short-segment moderate to severe proximal left P2 stenosis. No other high-grade or correctable stenosis identified within the major arterial vasculature of the head and neck. Relatively mild atheromatous disease for patient age. 3. Short-segment mild irregularity involving the V3 segments bilaterally, suspected to be related to mild FMD. 4. Diffuse tortuosity of the major arterial vasculature, suggesting chronic underlying hypertension. Electronically Signed   By: Jeannine Boga M.D.   On: 02/10/2017 23:57   Dg Chest 2 View  Result Date: 02/10/2017 CLINICAL DATA:  83 y/o F; altered mental status, chest pain, shortness of breath. EXAM: CHEST  2 VIEW COMPARISON:  04/02/2016 chest radiograph FINDINGS: Normal cardiac silhouette. Aortic atherosclerosis with calcification. Clear lungs. No pleural effusion or pneumothorax. Bones are unremarkable. IMPRESSION: No acute pulmonary process identified. Electronically Signed   By: Kristine Garbe M.D.   On: 02/10/2017 19:34   Ct Head Wo Contrast  Result Date: 02/10/2017 CLINICAL DATA:  Pain after fall EXAM: CT HEAD WITHOUT CONTRAST CT CERVICAL SPINE WITHOUT CONTRAST TECHNIQUE: Multidetector CT imaging of the head and cervical spine was performed following the standard protocol without  intravenous contrast. Multiplanar CT image reconstructions of the cervical spine were also generated. COMPARISON:  April 02, 2016 FINDINGS: CT HEAD FINDINGS Brain: No subdural, epidural, or subarachnoid hemorrhage. No mass effect or midline shift. Ventricles and sulci are prominent stable. Cerebellum, brainstem, and basal cisterns are normal. White matter changes are identified. There is a lacunar infarct in the left caudate, present previously. It is slightly more prominent today, probably due to difference in slice selection. There is a lacunar infarct in the right thalamus is more prominent today also, possibly due to difference in slice selection. No acute cortical ischemia or infarct identified. Vascular: No hyperdense vessel or unexpected calcification. Skull: Normal. Negative for fracture or focal lesion. Sinuses/Orbits: No acute finding. Other: None. CT CERVICAL SPINE FINDINGS Alignment: There is 2 mm of anterolisthesis of C7 versus T1 identified. Minimal anterolisthesis of T2 versus T3 identified. No other malalignment. Skull base and vertebrae: No acute fracture. No primary bone lesion or focal pathologic process. Soft tissues and spinal canal: No prevertebral fluid or swelling. No visible canal hematoma. Disc levels:  Multilevel degenerative disc disease identified. Upper chest: Negative. Other: No other abnormalities. IMPRESSION: 1. No acute traumatic intracranial abnormality. 2. There is a lacunar infarct in the right thalamus which was not appreciated on the previous study. It is possible the difference could be due to slice selection. An MRI could better assess for subtle acute infarcts. No other definitive acute abnormalities seen on today's study. 3. No fracture or traumatic malalignment in the cervical spine. Degenerative changes. Electronically Signed   By: Dorise Bullion III  M.D   On: 02/10/2017 18:31   Ct Angio Neck W And/or Wo Contrast  Result Date: 02/10/2017 CLINICAL DATA:  Initial  evaluation for bone witnessed fall. Concern for lacunar infarct. EXAM: CT ANGIOGRAPHY HEAD AND NECK TECHNIQUE: Multidetector CT imaging of the head and neck was performed using the standard protocol during bolus administration of intravenous contrast. Multiplanar CT image reconstructions and MIPs were obtained to evaluate the vascular anatomy. Carotid stenosis measurements (when applicable) are obtained utilizing NASCET criteria, using the distal internal carotid diameter as the denominator. CONTRAST:  100 cc of Isovue 370. COMPARISON:  Prior CT from earlier the same day. FINDINGS: CTA NECK FINDINGS Aortic arch: Visualized aortic arch of normal caliber with normal branch pattern. Scattered atheromatous plaque present within the aortic arch and about the origin the great vessels without flow-limiting stenosis. Visualized subclavian artery is are widely patent. Right carotid system: Right common carotid artery tortuous proximally but widely patent to the bifurcation. Mild atheromatous plaque about the proximal right ICA without flow-limiting stenosis. Right ICA tortuous but patent to the skullbase without stenosis, dissection, or occlusion. Left carotid system: Left common carotid artery tortuous proximally but widely patent to the bifurcation. No significant atheromatous narrowing about the left bifurcation/proximal left ICA. Left ICA patent distally to the skullbase without stenosis, dissection, or occlusion. Vertebral arteries: Both of the vertebral arteries arise from the subclavian arteries. Vertebral artery is tortuous proximally. Right vertebral artery dominant. Minimal focal irregularity at the bilateral V3 segments favored to be chronic and reflect short-segment mild FMD. Vertebral arteries patent within the neck without stenosis, dissection, or occlusion. Skeleton: No acute osseous abnormality. No worrisome lytic or blastic osseous lesions. Other neck: Soft tissues of the neck demonstrate no acute  abnormality. Salivary glands normal. No adenopathy. Subcentimeter hypodense right thyroid nodule noted, of doubtful significance. Thyroid otherwise unremarkable. Upper chest: Visualized upper chest within normal limits. Partially visualized lungs are clear. Review of the MIP images confirms the above findings CTA HEAD FINDINGS Anterior circulation: The petrous, cavernous, and supraclinoid segments widely patent without flow-limiting stenosis. Minimal atherosclerotic change for age within the carotid siphons. ICA termini widely patent. A1 segments patent bilaterally. Anterior communicating artery normal. Anterior cerebral artery is widely patent to their distal aspects. M1 segments patent without stenosis or occlusion. MCA bifurcations normal. No proximal M2 occlusion. Distal MCA branches well opacified and symmetric. Posterior circulation: Vertebral artery is widely patent to the vertebrobasilar junction without stenosis. Posterior inferior cerebral arteries patent bilaterally. Basilar artery widely patent. Superior cerebral arteries patent bilaterally. Both of the posterior cerebral arteries primarily supplied via the basilar artery. Right PCA patent to its distal aspect without stenosis. There is a short-segment moderate to severe proximal left P2 stenosis (series 9, image 96). Left PCA and widely patent distally. Venous sinuses: Patent. Anatomic variants: No significant anatomic variant. No aneurysm or vascular malformation. Delayed phase: No pathologic enhancement. Review of the MIP images confirms the above findings IMPRESSION: 1. Negative CTA of the head and neck. No acute large vessel or proximal arterial branch occlusion. 2. Single short-segment moderate to severe proximal left P2 stenosis. No other high-grade or correctable stenosis identified within the major arterial vasculature of the head and neck. Relatively mild atheromatous disease for patient age. 3. Short-segment mild irregularity involving the V3  segments bilaterally, suspected to be related to mild FMD. 4. Diffuse tortuosity of the major arterial vasculature, suggesting chronic underlying hypertension. Electronically Signed   By: Jeannine Boga M.D.   On: 02/10/2017 23:57  Ct Cervical Spine Wo Contrast  Result Date: 02/10/2017 CLINICAL DATA:  Pain after fall EXAM: CT HEAD WITHOUT CONTRAST CT CERVICAL SPINE WITHOUT CONTRAST TECHNIQUE: Multidetector CT imaging of the head and cervical spine was performed following the standard protocol without intravenous contrast. Multiplanar CT image reconstructions of the cervical spine were also generated. COMPARISON:  April 02, 2016 FINDINGS: CT HEAD FINDINGS Brain: No subdural, epidural, or subarachnoid hemorrhage. No mass effect or midline shift. Ventricles and sulci are prominent stable. Cerebellum, brainstem, and basal cisterns are normal. White matter changes are identified. There is a lacunar infarct in the left caudate, present previously. It is slightly more prominent today, probably due to difference in slice selection. There is a lacunar infarct in the right thalamus is more prominent today also, possibly due to difference in slice selection. No acute cortical ischemia or infarct identified. Vascular: No hyperdense vessel or unexpected calcification. Skull: Normal. Negative for fracture or focal lesion. Sinuses/Orbits: No acute finding. Other: None. CT CERVICAL SPINE FINDINGS Alignment: There is 2 mm of anterolisthesis of C7 versus T1 identified. Minimal anterolisthesis of T2 versus T3 identified. No other malalignment. Skull base and vertebrae: No acute fracture. No primary bone lesion or focal pathologic process. Soft tissues and spinal canal: No prevertebral fluid or swelling. No visible canal hematoma. Disc levels:  Multilevel degenerative disc disease identified. Upper chest: Negative. Other: No other abnormalities. IMPRESSION: 1. No acute traumatic intracranial abnormality. 2. There is a  lacunar infarct in the right thalamus which was not appreciated on the previous study. It is possible the difference could be due to slice selection. An MRI could better assess for subtle acute infarcts. No other definitive acute abnormalities seen on today's study. 3. No fracture or traumatic malalignment in the cervical spine. Degenerative changes. Electronically Signed   By: Dorise Bullion III M.D   On: 02/10/2017 18:31   Ct Lumbar Spine Wo Contrast  Result Date: 02/10/2017 CLINICAL DATA:  83 y/o  F; fall with lower back pain. EXAM: CT LUMBAR SPINE WITHOUT CONTRAST TECHNIQUE: Multidetector CT imaging of the lumbar spine was performed without intravenous contrast administration. Multiplanar CT image reconstructions were also generated. COMPARISON:  None. FINDINGS: Segmentation: 5 lumbar type vertebrae. Alignment: Normal. Vertebrae: L2 acute superior endplate fracture with moderate 50% loss of height and 7 mm retropulsion of the superior endplate. 8 mm anterior epidural soft tissue thickening at L2 level, probably a small epidural hematoma, with resultant moderate canal stenosis. No other acute fracture identified. Paraspinal and other soft tissues: Aortic and iliofemoral calcific atherosclerosis. Disc levels: Mild L3-4, moderate to severe L4-5, and moderate L5-S1 disc space narrowing. L2 through L5 spinous process articulation may represent Baastrup's disease. L2 through L5 small disc bulges. No high-grade bony canal stenosis or foraminal narrowing. IMPRESSION: L2 acute superior endplate fracture with 63% loss of height and 7 mm retropulsion of superior endplate. 8 mm anterior epidural soft tissue thickening at L2 level, probably a small epidural hematoma, with resultant moderate canal stenosis. These results were called by telephone at the time of interpretation on 02/10/2017 at 8:53 pm to Dr. Eliezer Mccoy , who verbally acknowledged these results. Electronically Signed   By: Kristine Garbe M.D.    On: 02/10/2017 20:56   Mr Brain Wo Contrast  Result Date: 02/11/2017 CLINICAL DATA:  Fall and mental status changes. Question age of right thalamic abnormality. EXAM: MRI HEAD WITHOUT CONTRAST TECHNIQUE: Multiplanar, multiecho pulse sequences of the brain and surrounding structures were obtained without intravenous contrast. COMPARISON:  CT examinations done yesterday and 04/02/2016. FINDINGS: Brain: Generalized brain atrophy. Diffusion imaging does not show any acute or subacute infarction. Mild chronic small-vessel changes of the pons. No focal cerebellar insult. Minimal chronic small-vessel change of the thalami and basal ganglia. Moderate chronic small-vessel changes of the cerebral hemispheric white matter. No large vessel territory infarction. No evidence of mass lesion, hemorrhage, obstructive hydrocephalus or extra-axial collection. Ventricular size is in proportion to the degree of generalized brain atrophy. Vascular: Major vessels at the base of the brain show flow. Skull and upper cervical spine: Negative Sinuses/Orbits: Clear/normal Other: None significant IMPRESSION: No acute or traumatic finding. Brain atrophy. Chronic small-vessel ischemic changes. No evidence of recent thalamic infarction. Electronically Signed   By: Nelson Chimes M.D.   On: 02/11/2017 08:13    Time Spent in minutes  20   Louellen Molder M.D on 02/11/2017 at 12:23 PM  Between 7am to 7pm - Pager - [phone]  After 7pm go to [email or web address] - password [facility]  [facility] -  Office  [phone]

## 2017-02-11 NOTE — Progress Notes (Signed)
Physical Therapy Evaluation Patient Details Name: Pamela Callahan MRN: [id number] DOB: [birth year] Today's Date: 02/11/2017   History of Present Illness   Pt is a 83 y.o. female with medical history significant of dementia, hypertension, hyperlipidemia, GERD, depression, anxiety, and diverticulitis. Pt currently admitted for a fall resulting in significant back pain and a compression fx of L2.  Clinical Impression  Pt admitted with back pain as a result of a fall. Pt currently with functional limitations due to the deficits listed below. Pt will benefit from skilled PT to increase their independence and safety with mobility to allow for discharge. Pt was given a TLSO to attempt conservative treatment but might need assistance to don and doff back brace in assisted living. Pt responded well to treatment but was overall minimal assist for transfers, bed mobility, and ambulation.     Follow Up Recommendations SNF;Home health PT (if ALF can provide assist, HHPT; if not then SNF)    Equipment Recommendations  None recommended by PT    Recommendations for Other Services       Precautions / Restrictions Precautions Precautions: Back;Fall Required Braces or Orthoses: Spinal Brace Spinal Brace: Thoracolumbosacral orthotic;Applied in sitting position Restrictions Weight Bearing Restrictions: No      Mobility  Bed Mobility Overal bed mobility: Needs Assistance Bed Mobility: Rolling;Sidelying to Sit Rolling: Min assist Sidelying to sit: Min assist       General bed mobility comments: Pt requires min assist in log rolling technique to transition from supine to sitting   Transfers Overall transfer level: Needs assistance Equipment used: Rolling walker (2 wheeled) Transfers: Sit to/from Stand Sit to Stand: Min assist         General transfer comment: Pt requires verbal cues and support for stability while transferring   Ambulation/Gait Ambulation/Gait assistance: Min  guard Ambulation Distance (Feet): 100 Feet Assistive device: Rolling walker (2 wheeled) Gait Pattern/deviations: Step-through pattern;Decreased stride length Gait velocity: decr   General Gait Details: Pt had decreased speed and stride length but tolerated ambulation well   Stairs            Wheelchair Mobility    Modified Rankin (Stroke Patients Only)       Balance Overall balance assessment: History of Falls;Needs assistance         Standing balance support: Bilateral upper extremity supported Standing balance-Leahy Scale: Poor Standing balance comment: Pt requires RW while standing                              Pertinent Vitals/Pain Pain Assessment: Faces Faces Pain Scale: Hurts little more Pain Location: back Pain Descriptors / Indicators: Aching;Sore Pain Intervention(s): Monitored during session;Repositioned;Premedicated before session    Home Living Family/patient expects to be discharged to:: Assisted living               Home Equipment: Walker - 2 wheels      Prior Function Level of Independence: Needs assistance   Gait / Transfers Assistance Needed: Pt was under supervision for all gait and transfers within a memory care unit   ADL's / Homemaking Assistance Needed: Pt was under supervision for bathing, dressing, and other ADLs        Hand Dominance        Extremity/Trunk Assessment   Upper Extremity Assessment Upper Extremity Assessment: Generalized weakness    Lower Extremity Assessment Lower Extremity Assessment: Generalized weakness       Communication   Communication:  HOH  Cognition Arousal/Alertness: Awake/alert Behavior During Therapy: WFL for tasks assessed/performed Overall Cognitive Status: History of cognitive impairments - at baseline                                 General Comments: Pt diagnosed with dementia       General Comments      Exercises     Assessment/Plan    PT  Assessment Patient needs continued PT services  PT Problem List Decreased strength;Decreased mobility;Decreased range of motion;Decreased activity tolerance;Decreased balance;Decreased knowledge of use of DME;Decreased knowledge of precautions       PT Treatment Interventions Gait training;DME instruction;Therapeutic activities;Therapeutic exercise;Patient/family education;Stair training;Balance training;Functional mobility training    PT Goals (Current goals can be found in the Care Plan section)  Acute Rehab PT Goals PT Goal Formulation: With patient/family Time For Goal Achievement: 02/25/17 Potential to Achieve Goals: Good    Frequency Min 3X/week   Barriers to discharge        Co-evaluation               AM-PAC PT "6 Clicks" Daily Activity  Outcome Measure Difficulty turning over in bed (including adjusting bedclothes, sheets and blankets)?: Total Difficulty moving from lying on back to sitting on the side of the bed? : Total Difficulty sitting down on and standing up from a chair with arms (e.g., wheelchair, bedside commode, etc,.)?: Total Help needed moving to and from a bed to chair (including a wheelchair)?: Total Help needed walking in hospital room?: A Little Help needed climbing 3-5 steps with a railing? : A Lot 6 Click Score: 9    End of Session Equipment Utilized During Treatment: Back brace;Gait belt Activity Tolerance: Patient tolerated treatment well Patient left: in chair;with call bell/phone within reach;with family/visitor present Nurse Communication: Mobility status (nurse was informed that pt would like to walk again and would also need to be moved back to the bed) PT Visit Diagnosis: Muscle weakness (generalized) (M62.81);Difficulty in walking, not elsewhere classified (R26.2)    Time: 3557-3220 PT Time Calculation (min) (ACUTE ONLY): 22 min   Charges:   PT Evaluation $PT Eval Low Complexity: 1 Procedure     PT G Codes:        Olegario Shearer, SPT  Reino Bellis 02/11/2017, 3:33 PM

## 2017-02-11 NOTE — Progress Notes (Signed)
Patient ID: Pamela Callahan, female   DOB: [birth year], 83 y.o.   MRN: [id number] I was called to evaluate the CT scan on this 83 year old who fell. This shows a moderate L2 compression fracture with some retropulsion and some canal stenosis. I do not find her to be a good surgical candidate. Hopefully this will heal in a brace over about 3 months time. She should be mobilized in a TLSO brace. Does not need the brace on in bed and can don the brace sitting. Should follow up with me on an outpatient basis 2 weeks after discharge. Would be helpful to have upright x-rays in the brace once it is delivered and once she can tolerate those.

## 2017-02-11 NOTE — ED Notes (Signed)
Pt son Chriss Czar was called and notified pt admission to Rm 1414

## 2017-02-11 NOTE — Care Management Note (Signed)
Case Management Note  Patient Details  Name: Pamela Callahan MRN: [id number] Date of Birth: [birth year]  Subjective/Objective: 83 y/o f admitted w/Fall. From ALF-[facility]. PT cons-await recc. CSW following.                   Action/Plan:d/c plan SNF.   Expected Discharge Date:                  Expected Discharge Plan:  Skilled Nursing Facility  In-House Referral:  Clinical Social Work  Discharge planning Services  CM Consult  Post Acute Care Choice:    Choice offered to:     DME Arranged:    DME Agency:     HH Arranged:    [place] Agency:     Status of Service:  In process, will continue to follow  If discussed at Long Length of Stay Meetings, dates discussed:    Additional Comments:  Dessa Phi, RN 02/11/2017, 1:35 PM

## 2017-02-11 NOTE — H&P (Signed)
History and Physical    Pamela Callahan IOE:[id number] DOB: 06-23-1929 DOA: 02/10/2017  Referring MD/NP/PA:   PCP: Cassandria Anger, MD   Patient coming from:  The patient is coming from SNF.  At baseline, pt is dependent for most of ADL.       Chief Complaint: fall and back pain  HPI: Pamela Callahan is a 83 y.o. female with medical history significant of dementia, hypertension, hyperlipidemia, GERD, depression, anxiety, diverticulitis, who presents with fall and back pain.   Per report, pt fell when she was working on fixing a piece of furniture. She developed moderated back pain. Patient denies hitting her head or losing consciousness. She dose not have fever, chills, headache, lightheadedness, dizziness, chest pain, shortness of breath, abdominal pain, nausea, vomiting, urinary symptoms. She moves all extremities normally.  ED Course: pt was found to have WBC 8.6, INR 1.05, creatinine normal, temperature normal, no tachycardia, oxygen saturation 96% on room air. Pt is admitted to telemetry bed as inpatient. Neurology and neurosurgery were consulted.  # CT-L spin showed: L2 acute superior endplate fracture with 18% loss of height and 7 mm retropulsion of superior endplate. 8 mm anterior epidural soft tissue thickening at L2 level, probably a small epidural hematoma, with resultant moderate canal stenosis.  # CT-head and C spin showed 1. No acute traumatic intracranial abnormality. 2. There is a lacunar infarct in the right thalamus which was not appreciated on the previous study. 3. No fracture or traumatic malalignment in the cervical spine.Degenerative changes.  # CTA of head and neck: 1. Negative CTA of the head and neck. No acute large vessel or proximal arterial branch occlusion. 2. Single short-segment moderate to severe proximal left P2 stenosis. No other high-grade or correctable stenosis identified within the major arterial vasculature of the head and neck. Relatively mild  atheromatous disease for patient age. 3. Short-segment mild irregularity involving the V3 segments bilaterally, suspected to be related to mild FMD. 4. Diffuse tortuosity of the major arterial vasculature, suggestingchronic underlying hypertension.   Review of Systems: Could not be reviewed accurately due to dementia.  Allergy:  Allergies  Allergen Reactions  . Latex Rash  . Ace Inhibitors     REACTION: cough  . Penicillins   . Yellow Dye     Other reaction(s): Other (See Comments)    Past Medical History:  Diagnosis Date  . Allergy   . Anxiety   . Depression   . Diverticulitis   . GERD (gastroesophageal reflux disease)   . Hyperlipidemia   . Hypertension   . Low back pain   . Macular degeneration of left eye   . Osteopenia   . PAC (premature atrial contraction)     Past Surgical History:  Procedure Laterality Date  . BACK SURGERY    . CHOLECYSTECTOMY  2006  . TOTAL HIP ARTHROPLASTY  2008   Right    Social History:  reports that she has never smoked. She has never used smokeless tobacco. She reports that she does not drink alcohol or use drugs.  Family History:  Family History  Problem Relation Age of Onset  . Hypertension Mother   . Hypertension Other      Prior to Admission medications   Medication Sig Start Date End Date Taking? Authorizing Provider  aspirin 81 MG tablet Take 81 mg by mouth daily.     Yes [provider]  clorazepate (TRANXENE) 3.75 MG tablet Take 3.75 mg by mouth 2 (two) times daily as  needed for anxiety.   Yes [provider]  divalproex (DEPAKOTE) 250 MG DR tablet Take 250 mg by mouth 2 (two) times daily.   Yes [provider]  donepezil (ARICEPT) 10 MG tablet Take 1 tablet (10 mg total) by mouth at bedtime. Patient taking differently: Take 20 mg by mouth at bedtime.  04/14/16  Yes Plotnikov, Evie Lacks, MD  fexofenadine (ALLEGRA) 180 MG tablet Take 1 tablet (180 mg total) by mouth daily. 12/21/11  Yes Plotnikov,  Evie Lacks, MD  LORazepam (ATIVAN) 1 MG tablet Take 1 mg by mouth 3 (three) times daily.   Yes [provider]  Magnesium Gluconate 550 MG TABS Take 1 tablet by mouth daily.    Yes [provider]  memantine (NAMENDA) 10 MG tablet Take 10 mg by mouth daily.  02/06/17  Yes [provider]  Multiple Vitamins-Minerals (PRESERVISION/LUTEIN) CAPS Take by mouth 2 (two) times daily.     Yes [provider]  olmesartan (BENICAR) 20 MG tablet Take 1 tablet (20 mg total) by mouth daily. 03/26/16  Yes Plotnikov, Evie Lacks, MD  PARoxetine (PAXIL) 10 MG tablet TAKE 1 TABLET (10 MG TOTAL) BY MOUTH DAILY. 08/19/15  Yes Plotnikov, Evie Lacks, MD  PARoxetine (PAXIL) 20 MG tablet Take 20 mg by mouth daily.  02/06/17  Yes [provider]  vitamin B-12 (CYANOCOBALAMIN) 1000 MCG tablet Take 1 tablet (1,000 mcg total) by mouth daily. 12/25/15  Yes Plotnikov, Evie Lacks, MD  clorazepate (TRANXENE) 7.5 MG tablet Take 1 tablet (7.5 mg total) by mouth 2 (two) times daily as needed for anxiety or sleep. Patient not taking: Reported on 02/10/2017 05/10/16   Plotnikov, Evie Lacks, MD  fluticasone (FLONASE) 50 MCG/ACT nasal spray Place 1 spray into both nostrils daily. As needed Patient not taking: Reported on 02/10/2017 09/11/13   Plotnikov, Evie Lacks, MD  triamcinolone cream (KENALOG) 0.5 % Apply topically 2 (two) times daily. Patient not taking: Reported on 02/10/2017 03/12/14   Cassandria Anger, MD    Physical Exam: Vitals:   02/10/17 2148 02/10/17 2246 02/11/17 0103 02/11/17 0223  BP:  138/72 134/63 130/64  Pulse:  62 73 83  Resp:  17 16 16   Temp: 98.2 F (36.8 C)   99 F (37.2 C)  TempSrc:    Oral  SpO2:  100% 92% 100%  Weight:    65 kg (143 lb 4.8 oz)   General: Not in acute distress HEENT:       Eyes: PERRL, EOMI, no scleral icterus.       ENT: No discharge from the ears and nose, no pharynx injection, no tonsillar enlargement.        Neck: No JVD, no bruit, no mass  felt. Heme: No neck lymph node enlargement. Cardiac: S1/S2, RRR, No murmurs, No gallops or rubs. Respiratory: No rales, wheezing, rhonchi or rubs. GI: Soft, nondistended, nontender, no rebound pain, no organomegaly, BS present. GU: No hematuria Ext: No pitting leg edema bilaterally. 2+DP/PT pulse bilaterally. Musculoskeletal: has lower back tenderness  Skin: No rashes.  Neuro: confused, not oriented X3, cranial nerves II-XII grossly intact, moves all extremities normally.  Psych: Patient is not psychotic, no suicidal or hemocidal ideation.  Labs on Admission: I have personally reviewed following labs and imaging studies  CBC:  Recent Labs Lab 02/10/17 1827 02/10/17 1927  WBC 8.6  --   NEUTROABS 6.9  --   HGB 11.8* 12.2  HCT 34.5* 36.0  MCV 89.8  --   PLT  163  --    Basic Metabolic Panel:  Recent Labs Lab 02/10/17 1827 02/10/17 1927  NA 133* 133*  K 4.1 4.0  CL 97* 94*  CO2 27  --   GLUCOSE 97 92  BUN 16 14  CREATININE 0.69 0.70  CALCIUM 8.6*  --    GFR: CrCl cannot be calculated (Unknown ideal weight.). Liver Function Tests: No results for input(s): AST, ALT, ALKPHOS, BILITOT, PROT, ALBUMIN in the last 168 hours. No results for input(s): LIPASE, AMYLASE in the last 168 hours. No results for input(s): AMMONIA in the last 168 hours. Coagulation Profile:  Recent Labs Lab 02/10/17 1910  INR 1.05   Cardiac Enzymes: No results for input(s): CKTOTAL, CKMB, CKMBINDEX, TROPONINI in the last 168 hours. BNP (last 3 results) No results for input(s): PROBNP in the last 8760 hours. HbA1C: No results for input(s): HGBA1C in the last 72 hours. CBG: No results for input(s): GLUCAP in the last 168 hours. Lipid Profile: No results for input(s): CHOL, HDL, LDLCALC, TRIG, CHOLHDL, LDLDIRECT in the last 72 hours. Thyroid Function Tests: No results for input(s): TSH, T4TOTAL, FREET4, T3FREE, THYROIDAB in the last 72 hours. Anemia Panel: No results for input(s):  VITAMINB12, FOLATE, FERRITIN, TIBC, IRON, RETICCTPCT in the last 72 hours. Urine analysis:    Component Value Date/Time   COLORURINE YELLOW 02/10/2017 [place] 02/10/2017 1735   LABSPEC 1.010 02/10/2017 1735   PHURINE 8.0 02/10/2017 1735   GLUCOSEU NEGATIVE 02/10/2017 1735   GLUCOSEU NEGATIVE 07/01/2015 1647   HGBUR NEGATIVE 02/10/2017 1735   BILIRUBINUR NEGATIVE 02/10/2017 1735   KETONESUR 5 (A) 02/10/2017 1735   PROTEINUR NEGATIVE 02/10/2017 1735   UROBILINOGEN 0.2 07/01/2015 1647   NITRITE NEGATIVE 02/10/2017 1735   LEUKOCYTESUR NEGATIVE 02/10/2017 1735   Sepsis Labs: @LABRCNTIP (procalcitonin:4,lacticidven:4) )No results found for this or any previous visit (from the past 240 hour(s)).   Radiological Exams on Admission: Ct Angio Head W Or Wo Contrast  Result Date: 02/10/2017 CLINICAL DATA:  Initial evaluation for bone witnessed fall. Concern for lacunar infarct. EXAM: CT ANGIOGRAPHY HEAD AND NECK TECHNIQUE: Multidetector CT imaging of the head and neck was performed using the standard protocol during bolus administration of intravenous contrast. Multiplanar CT image reconstructions and MIPs were obtained to evaluate the vascular anatomy. Carotid stenosis measurements (when applicable) are obtained utilizing NASCET criteria, using the distal internal carotid diameter as the denominator. CONTRAST:  100 cc of Isovue 370. COMPARISON:  Prior CT from earlier the same day. FINDINGS: CTA NECK FINDINGS Aortic arch: Visualized aortic arch of normal caliber with normal branch pattern. Scattered atheromatous plaque present within the aortic arch and about the origin the great vessels without flow-limiting stenosis. Visualized subclavian artery is are widely patent. Right carotid system: Right common carotid artery tortuous proximally but widely patent to the bifurcation. Mild atheromatous plaque about the proximal right ICA without flow-limiting stenosis. Right ICA tortuous but  patent to the skullbase without stenosis, dissection, or occlusion. Left carotid system: Left common carotid artery tortuous proximally but widely patent to the bifurcation. No significant atheromatous narrowing about the left bifurcation/proximal left ICA. Left ICA patent distally to the skullbase without stenosis, dissection, or occlusion. Vertebral arteries: Both of the vertebral arteries arise from the subclavian arteries. Vertebral artery is tortuous proximally. Right vertebral artery dominant. Minimal focal irregularity at the bilateral V3 segments favored to be chronic and reflect short-segment mild FMD. Vertebral arteries patent within the neck without stenosis, dissection, or occlusion. Skeleton: No acute osseous  abnormality. No worrisome lytic or blastic osseous lesions. Other neck: Soft tissues of the neck demonstrate no acute abnormality. Salivary glands normal. No adenopathy. Subcentimeter hypodense right thyroid nodule noted, of doubtful significance. Thyroid otherwise unremarkable. Upper chest: Visualized upper chest within normal limits. Partially visualized lungs are clear. Review of the MIP images confirms the above findings CTA HEAD FINDINGS Anterior circulation: The petrous, cavernous, and supraclinoid segments widely patent without flow-limiting stenosis. Minimal atherosclerotic change for age within the carotid siphons. ICA termini widely patent. A1 segments patent bilaterally. Anterior communicating artery normal. Anterior cerebral artery is widely patent to their distal aspects. M1 segments patent without stenosis or occlusion. MCA bifurcations normal. No proximal M2 occlusion. Distal MCA branches well opacified and symmetric. Posterior circulation: Vertebral artery is widely patent to the vertebrobasilar junction without stenosis. Posterior inferior cerebral arteries patent bilaterally. Basilar artery widely patent. Superior cerebral arteries patent bilaterally. Both of the posterior  cerebral arteries primarily supplied via the basilar artery. Right PCA patent to its distal aspect without stenosis. There is a short-segment moderate to severe proximal left P2 stenosis (series 9, image 96). Left PCA and widely patent distally. Venous sinuses: Patent. Anatomic variants: No significant anatomic variant. No aneurysm or vascular malformation. Delayed phase: No pathologic enhancement. Review of the MIP images confirms the above findings IMPRESSION: 1. Negative CTA of the head and neck. No acute large vessel or proximal arterial branch occlusion. 2. Single short-segment moderate to severe proximal left P2 stenosis. No other high-grade or correctable stenosis identified within the major arterial vasculature of the head and neck. Relatively mild atheromatous disease for patient age. 3. Short-segment mild irregularity involving the V3 segments bilaterally, suspected to be related to mild FMD. 4. Diffuse tortuosity of the major arterial vasculature, suggesting chronic underlying hypertension. Electronically Signed   By: Jeannine Boga M.D.   On: 02/10/2017 23:57   Dg Chest 2 View  Result Date: 02/10/2017 CLINICAL DATA:  83 y/o F; altered mental status, chest pain, shortness of breath. EXAM: CHEST  2 VIEW COMPARISON:  04/02/2016 chest radiograph FINDINGS: Normal cardiac silhouette. Aortic atherosclerosis with calcification. Clear lungs. No pleural effusion or pneumothorax. Bones are unremarkable. IMPRESSION: No acute pulmonary process identified. Electronically Signed   By: Kristine Garbe M.D.   On: 02/10/2017 19:34   Ct Head Wo Contrast  Result Date: 02/10/2017 CLINICAL DATA:  Pain after fall EXAM: CT HEAD WITHOUT CONTRAST CT CERVICAL SPINE WITHOUT CONTRAST TECHNIQUE: Multidetector CT imaging of the head and cervical spine was performed following the standard protocol without intravenous contrast. Multiplanar CT image reconstructions of the cervical spine were also generated.  COMPARISON:  April 02, 2016 FINDINGS: CT HEAD FINDINGS Brain: No subdural, epidural, or subarachnoid hemorrhage. No mass effect or midline shift. Ventricles and sulci are prominent stable. Cerebellum, brainstem, and basal cisterns are normal. White matter changes are identified. There is a lacunar infarct in the left caudate, present previously. It is slightly more prominent today, probably due to difference in slice selection. There is a lacunar infarct in the right thalamus is more prominent today also, possibly due to difference in slice selection. No acute cortical ischemia or infarct identified. Vascular: No hyperdense vessel or unexpected calcification. Skull: Normal. Negative for fracture or focal lesion. Sinuses/Orbits: No acute finding. Other: None. CT CERVICAL SPINE FINDINGS Alignment: There is 2 mm of anterolisthesis of C7 versus T1 identified. Minimal anterolisthesis of T2 versus T3 identified. No other malalignment. Skull base and vertebrae: No acute fracture. No primary bone lesion or focal  pathologic process. Soft tissues and spinal canal: No prevertebral fluid or swelling. No visible canal hematoma. Disc levels:  Multilevel degenerative disc disease identified. Upper chest: Negative. Other: No other abnormalities. IMPRESSION: 1. No acute traumatic intracranial abnormality. 2. There is a lacunar infarct in the right thalamus which was not appreciated on the previous study. It is possible the difference could be due to slice selection. An MRI could better assess for subtle acute infarcts. No other definitive acute abnormalities seen on today's study. 3. No fracture or traumatic malalignment in the cervical spine. Degenerative changes. Electronically Signed   By: Dorise Bullion III M.D   On: 02/10/2017 18:31   Ct Angio Neck W And/or Wo Contrast  Result Date: 02/10/2017 CLINICAL DATA:  Initial evaluation for bone witnessed fall. Concern for lacunar infarct. EXAM: CT ANGIOGRAPHY HEAD AND NECK  TECHNIQUE: Multidetector CT imaging of the head and neck was performed using the standard protocol during bolus administration of intravenous contrast. Multiplanar CT image reconstructions and MIPs were obtained to evaluate the vascular anatomy. Carotid stenosis measurements (when applicable) are obtained utilizing NASCET criteria, using the distal internal carotid diameter as the denominator. CONTRAST:  100 cc of Isovue 370. COMPARISON:  Prior CT from earlier the same day. FINDINGS: CTA NECK FINDINGS Aortic arch: Visualized aortic arch of normal caliber with normal branch pattern. Scattered atheromatous plaque present within the aortic arch and about the origin the great vessels without flow-limiting stenosis. Visualized subclavian artery is are widely patent. Right carotid system: Right common carotid artery tortuous proximally but widely patent to the bifurcation. Mild atheromatous plaque about the proximal right ICA without flow-limiting stenosis. Right ICA tortuous but patent to the skullbase without stenosis, dissection, or occlusion. Left carotid system: Left common carotid artery tortuous proximally but widely patent to the bifurcation. No significant atheromatous narrowing about the left bifurcation/proximal left ICA. Left ICA patent distally to the skullbase without stenosis, dissection, or occlusion. Vertebral arteries: Both of the vertebral arteries arise from the subclavian arteries. Vertebral artery is tortuous proximally. Right vertebral artery dominant. Minimal focal irregularity at the bilateral V3 segments favored to be chronic and reflect short-segment mild FMD. Vertebral arteries patent within the neck without stenosis, dissection, or occlusion. Skeleton: No acute osseous abnormality. No worrisome lytic or blastic osseous lesions. Other neck: Soft tissues of the neck demonstrate no acute abnormality. Salivary glands normal. No adenopathy. Subcentimeter hypodense right thyroid nodule noted, of  doubtful significance. Thyroid otherwise unremarkable. Upper chest: Visualized upper chest within normal limits. Partially visualized lungs are clear. Review of the MIP images confirms the above findings CTA HEAD FINDINGS Anterior circulation: The petrous, cavernous, and supraclinoid segments widely patent without flow-limiting stenosis. Minimal atherosclerotic change for age within the carotid siphons. ICA termini widely patent. A1 segments patent bilaterally. Anterior communicating artery normal. Anterior cerebral artery is widely patent to their distal aspects. M1 segments patent without stenosis or occlusion. MCA bifurcations normal. No proximal M2 occlusion. Distal MCA branches well opacified and symmetric. Posterior circulation: Vertebral artery is widely patent to the vertebrobasilar junction without stenosis. Posterior inferior cerebral arteries patent bilaterally. Basilar artery widely patent. Superior cerebral arteries patent bilaterally. Both of the posterior cerebral arteries primarily supplied via the basilar artery. Right PCA patent to its distal aspect without stenosis. There is a short-segment moderate to severe proximal left P2 stenosis (series 9, image 96). Left PCA and widely patent distally. Venous sinuses: Patent. Anatomic variants: No significant anatomic variant. No aneurysm or vascular malformation. Delayed phase: No pathologic enhancement.  Review of the MIP images confirms the above findings IMPRESSION: 1. Negative CTA of the head and neck. No acute large vessel or proximal arterial branch occlusion. 2. Single short-segment moderate to severe proximal left P2 stenosis. No other high-grade or correctable stenosis identified within the major arterial vasculature of the head and neck. Relatively mild atheromatous disease for patient age. 3. Short-segment mild irregularity involving the V3 segments bilaterally, suspected to be related to mild FMD. 4. Diffuse tortuosity of the major arterial  vasculature, suggesting chronic underlying hypertension. Electronically Signed   By: Jeannine Boga M.D.   On: 02/10/2017 23:57   Ct Cervical Spine Wo Contrast  Result Date: 02/10/2017 CLINICAL DATA:  Pain after fall EXAM: CT HEAD WITHOUT CONTRAST CT CERVICAL SPINE WITHOUT CONTRAST TECHNIQUE: Multidetector CT imaging of the head and cervical spine was performed following the standard protocol without intravenous contrast. Multiplanar CT image reconstructions of the cervical spine were also generated. COMPARISON:  April 02, 2016 FINDINGS: CT HEAD FINDINGS Brain: No subdural, epidural, or subarachnoid hemorrhage. No mass effect or midline shift. Ventricles and sulci are prominent stable. Cerebellum, brainstem, and basal cisterns are normal. White matter changes are identified. There is a lacunar infarct in the left caudate, present previously. It is slightly more prominent today, probably due to difference in slice selection. There is a lacunar infarct in the right thalamus is more prominent today also, possibly due to difference in slice selection. No acute cortical ischemia or infarct identified. Vascular: No hyperdense vessel or unexpected calcification. Skull: Normal. Negative for fracture or focal lesion. Sinuses/Orbits: No acute finding. Other: None. CT CERVICAL SPINE FINDINGS Alignment: There is 2 mm of anterolisthesis of C7 versus T1 identified. Minimal anterolisthesis of T2 versus T3 identified. No other malalignment. Skull base and vertebrae: No acute fracture. No primary bone lesion or focal pathologic process. Soft tissues and spinal canal: No prevertebral fluid or swelling. No visible canal hematoma. Disc levels:  Multilevel degenerative disc disease identified. Upper chest: Negative. Other: No other abnormalities. IMPRESSION: 1. No acute traumatic intracranial abnormality. 2. There is a lacunar infarct in the right thalamus which was not appreciated on the previous study. It is possible the  difference could be due to slice selection. An MRI could better assess for subtle acute infarcts. No other definitive acute abnormalities seen on today's study. 3. No fracture or traumatic malalignment in the cervical spine. Degenerative changes. Electronically Signed   By: Dorise Bullion III M.D   On: 02/10/2017 18:31   Ct Lumbar Spine Wo Contrast  Result Date: 02/10/2017 CLINICAL DATA:  83 y/o  F; fall with lower back pain. EXAM: CT LUMBAR SPINE WITHOUT CONTRAST TECHNIQUE: Multidetector CT imaging of the lumbar spine was performed without intravenous contrast administration. Multiplanar CT image reconstructions were also generated. COMPARISON:  None. FINDINGS: Segmentation: 5 lumbar type vertebrae. Alignment: Normal. Vertebrae: L2 acute superior endplate fracture with moderate 50% loss of height and 7 mm retropulsion of the superior endplate. 8 mm anterior epidural soft tissue thickening at L2 level, probably a small epidural hematoma, with resultant moderate canal stenosis. No other acute fracture identified. Paraspinal and other soft tissues: Aortic and iliofemoral calcific atherosclerosis. Disc levels: Mild L3-4, moderate to severe L4-5, and moderate L5-S1 disc space narrowing. L2 through L5 spinous process articulation may represent Baastrup's disease. L2 through L5 small disc bulges. No high-grade bony canal stenosis or foraminal narrowing. IMPRESSION: L2 acute superior endplate fracture with 74% loss of height and 7 mm retropulsion of superior endplate. 8 mm  anterior epidural soft tissue thickening at L2 level, probably a small epidural hematoma, with resultant moderate canal stenosis. These results were called by telephone at the time of interpretation on 02/10/2017 at 8:53 pm to Dr. Eliezer Mccoy , who verbally acknowledged these results. Electronically Signed   By: Kristine Garbe M.D.   On: 02/10/2017 20:56     EKG: Independently reviewed.  Sinus rhythm, QTC 513, early R-wave  progression, LAD, right bundle blockage.   Assessment/Plan Principal Problem:   Fall Active Problems:   Essential hypertension   RBBB (right bundle branch block)   Lacunar stroke ([place])   Depression with anxiety   Closed L2 vertebral fracture ([place])   Traumatic epidural hematoma   Fall and possible lacunar stroke: Etiology for fall is not clear. CT scan showed possible lacunar infarct in the right thalamus which was not appreciated on the previous study. CTA of head and neck negative. Neurology, Dr. Charise Carwin was Consulted-->recommended to get MRI of brain and 2d echo.  - will admit to tele bed as inpt - Obtain MRI of braion - No ASA due to epidural hematoma - fasting lipid panel and HbA1c  - 2D transthoracic echocardiography  - PT/OT consult  Closed L2 vertebral fracture and Traumatic epidural hematoma: Neurosurgery was consulted. -TLSO per Dr. Ronnald Ramp -prn percocet for pain  HTN: -irbesartan -IV hydralazine when necessary  Dementia: -contiue donepezil and namenda  Depression and anxiety: Stable, no suicidal or homicidal ideations. -Continue home medications: Paxil, Ativan, when necessary clorazepate   DVT ppx: SCD Code Status: DNR (I discussed with patient's son on the phone, and explained the meaning of CODE STATUS. Patient would want to be DNR) Family Communication: None at bed side. Disposition Plan:  Anticipate discharge back to previous SNF environment Consults called:  Neurology, Dr. Charise Carwin and  neurosurgeon,  Dr. Ronnald Ramp Admission status:  Inpatient/tele    Date of Service 02/11/2017    Ivor Costa [facility] Pager [phone]  If 7PM-7AM, please contact night-coverage [email or web address] Password [email or web address] 02/11/2017, 2:58 AM

## 2017-02-11 NOTE — ED Notes (Signed)
[employer] called and stated that he would place TLSO brace tomorrow, as pt was being admitted.

## 2017-02-11 NOTE — Progress Notes (Addendum)
MRI reviewed and no acute infarct noted. Discussed with attending. At this time no further neurological diagnostics recommended. Please call with any questions.  Neurology S/O  Etta Quill PA-C [facility] [phone]  M-F  (8:30 am- 4 PM)  02/11/2017, 8:36 AM

## 2017-02-12 ENCOUNTER — Inpatient Hospital Stay (HOSPITAL_COMMUNITY): Payer: Medicare Other

## 2017-02-12 DIAGNOSIS — S32020D Wedge compression fracture of second lumbar vertebra, subsequent encounter for fracture with routine healing: Secondary | ICD-10-CM

## 2017-02-12 DIAGNOSIS — F03C0 Unspecified dementia, severe, without behavioral disturbance, psychotic disturbance, mood disturbance, and anxiety: Secondary | ICD-10-CM | POA: Diagnosis present

## 2017-02-12 DIAGNOSIS — F0390 Unspecified dementia without behavioral disturbance: Secondary | ICD-10-CM | POA: Diagnosis present

## 2017-02-12 DIAGNOSIS — W19XXXD Unspecified fall, subsequent encounter: Secondary | ICD-10-CM

## 2017-02-12 LAB — HEMOGLOBIN A1C
Hgb A1c MFr Bld: 5.1 % (ref 4.8–5.6)
Mean Plasma Glucose: 100 mg/dL

## 2017-02-12 MED ORDER — LORAZEPAM 1 MG PO TABS: 1.0000 mg | ORAL_TABLET | Freq: Three times a day (TID) | ORAL | 0 refills | Status: DC

## 2017-02-12 MED ORDER — POLYETHYLENE GLYCOL 3350 17 G PO PACK: 17.0000 g | PACK | Freq: Every day | ORAL | 0 refills | Status: DC | PRN

## 2017-02-12 MED ORDER — OXYCODONE-ACETAMINOPHEN 5-325 MG PO TABS: 1.0000 | ORAL_TABLET | Freq: Four times a day (QID) | ORAL | 0 refills | Status: DC | PRN

## 2017-02-12 MED ORDER — SODIUM CHLORIDE 0.9 % IV BOLUS (SEPSIS)
500.0000 mL | Freq: Once | INTRAVENOUS | Status: AC
  Administered 2017-02-12: 500 mL via INTRAVENOUS

## 2017-02-12 NOTE — Progress Notes (Signed)
Discharge to ALF cancelled as pt cannot get PT at memory care unit. She will need SNF, Possibly on 7/16. Also has low BP. meds held. Check manual BP, if low will order IV fluids.

## 2017-02-12 NOTE — Discharge Summary (Signed)
Physician Discharge Summary  Pamela Callahan YQM:[id number] DOB: [birth year] DOA: 02/10/2017  PCP: Cassandria Anger, MD  Admit date: 02/10/2017 Discharge date: 02/12/2017  Admitted From: [facility], memory care Disposition:  Return to ALF, memory care unit with HHPT  Recommendations for Outpatient Follow-up:  1. Follow up with PCP in 1-2 weeks 2. Follow up with neurosurgery Dr Ronnald Ramp in 2 weeks  Home Health: PT Equipment/Devices: lumbar TLSO brace  Discharge Condition: FAIR CODE STATUS: DNR Diet recommendation: regular    Discharge Diagnoses:  Principal Problem:   Fall   Active Problems:   Essential hypertension   RBBB (right bundle branch block)   B12 deficiency   Depression with anxiety   Closed L2 vertebral fracture (HCC)   Traumatic epidural hematoma   Severe dementia  Brief narrative 83 year old severely demented female with hypertension, hyperlipidemia, anxiety, depression and GERD presented with fall at the memory care unit and complained of back pain. Reportedly she fell while she was working to fix piece of furniture. She complained of pain in her back. No reported loss of consciousness or head injury. No fever, chills, dizziness, chest pain, shortness of breath, abdominal pain, nausea, vomiting, bowel or urinary symptoms. In the ED vitals were stable. CT scan of the lumbar spine showed L2 acute fracture with 50% loss of height and anterior epidural soft tissue thickening suggestive of epidural hematoma causing moderate canal stenosis. CT of the head and cervical spine showed a new lacunar infarct in the right thalamus. Patient admitted to hospitalist service, neurology and neurosurgery consulted.  Hospital course  Principal Problem:  Fall with acute L2 compression fracture - associated retropulsion and moderate canal stenosis. Imaging reviewed by neurosurgeon and considered not to be a surgical candidate. Recommended applying TLSO brace mobilizing her on it.  Recommended that she does not need the brace while lying down and can be applied when she is sitting and moving around. She should follow-up with him in 2 weeks after discharge. -upright xray of lumbar spine done today with brace applied ,s hows stable L2 fracture. PT recommends HH. Case manager informed to arrange [facility] at memory care.  Pain control with when necessary Percocet.  Active Problems:  Abnormal head CT Head CT shows possible acute lacunar infarct. [facility] Kitchen MRI brain negative for acute stroke. No need for further stroke workup. Neurology signed off.    Essential hypertension Stable. Continue ARB.  Severe dementia Continue Namenda and Aricept  Anxiety and depression Stable. Continue home medications.     Family Communication  : Daughter at bedside  Disposition Plan  :  return to memory care unit with PT     Consults  :   Neurology Neurosurgery (Dr. Ronnald Ramp)  Procedures  :  CT head, CT angiogram of the head and neck CT lumbar spine MRI brain  Discharge Instructions   Allergies as of 02/12/2017      Reactions   Latex Rash   Ace Inhibitors    REACTION: cough   Penicillins    Yellow Dye    Other reaction(s): Other (See Comments)      Medication List    STOP taking these medications   triamcinolone cream 0.5 % Commonly known as:  KENALOG     TAKE these medications   aspirin 81 MG tablet Take 81 mg by mouth daily.   clorazepate 3.75 MG tablet Commonly known as:  TRANXENE Take 3.75 mg by mouth 2 (two) times daily as needed for anxiety. What changed:  Another medication with  the same name was removed. Continue taking this medication, and follow the directions you see here.   divalproex 250 MG DR tablet Commonly known as:  DEPAKOTE Take 250 mg by mouth 2 (two) times daily.   donepezil 10 MG tablet Commonly known as:  ARICEPT Take 1 tablet (10 mg total) by mouth at bedtime. What changed:  how much to take   fexofenadine 180 MG tablet Commonly  known as:  ALLEGRA Take 1 tablet (180 mg total) by mouth daily.   fluticasone 50 MCG/ACT nasal spray Commonly known as:  FLONASE Place 1 spray into both nostrils daily. As needed   LORazepam 1 MG tablet Commonly known as:  ATIVAN Take 1 tablet (1 mg total) by mouth 3 (three) times daily.   Magnesium Gluconate 550 MG Tabs Take 1 tablet by mouth daily.   memantine 10 MG tablet Commonly known as:  NAMENDA Take 10 mg by mouth daily.   olmesartan 20 MG tablet Commonly known as:  BENICAR Take 1 tablet (20 mg total) by mouth daily.   oxyCODONE-acetaminophen 5-325 MG tablet Commonly known as:  PERCOCET/ROXICET Take 1 tablet by mouth every 6 (six) hours as needed for moderate pain.   PARoxetine 20 MG tablet Commonly known as:  PAXIL Take 20 mg by mouth daily. What changed:  Another medication with the same name was removed. Continue taking this medication, and follow the directions you see here.   polyethylene glycol packet Commonly known as:  MIRALAX / GLYCOLAX Take 17 g by mouth daily as needed for mild constipation.   PRESERVISION/LUTEIN Caps Take by mouth 2 (two) times daily.   vitamin B-12 1000 MCG tablet Commonly known as:  CYANOCOBALAMIN Take 1 tablet (1,000 mcg total) by mouth daily.      Follow-up Information    MD at memory care Follow up in 1 week(s).        Pamela Moore, MD. Schedule an appointment as soon as possible for a visit in 2 week(s).   Specialty:  Neurosurgery Contact information: [address] [place]  [address] [phone]          Allergies  Allergen Reactions  . Latex Rash  . Ace Inhibitors     REACTION: cough  . Penicillins   . Yellow Dye     Other reaction(s): Other (See Comments)     Procedures/Studies: Ct Angio Head W Or Wo Contrast  Result Date: 02/10/2017 CLINICAL DATA:  Initial evaluation for bone witnessed fall. Concern for lacunar infarct. EXAM: CT ANGIOGRAPHY HEAD AND NECK TECHNIQUE:  Multidetector CT imaging of the head and neck was performed using the standard protocol during bolus administration of intravenous contrast. Multiplanar CT image reconstructions and MIPs were obtained to evaluate the vascular anatomy. Carotid stenosis measurements (when applicable) are obtained utilizing NASCET criteria, using the distal internal carotid diameter as the denominator. CONTRAST:  100 cc of Isovue 370. COMPARISON:  Prior CT from earlier the same day. FINDINGS: CTA NECK FINDINGS Aortic arch: Visualized aortic arch of normal caliber with normal branch pattern. Scattered atheromatous plaque present within the aortic arch and about the origin the great vessels without flow-limiting stenosis. Visualized subclavian artery is are widely patent. Right carotid system: Right common carotid artery tortuous proximally but widely patent to the bifurcation. Mild atheromatous plaque about the proximal right ICA without flow-limiting stenosis. Right ICA tortuous but patent to the skullbase without stenosis, dissection, or occlusion. Left carotid system: Left common carotid artery tortuous proximally but widely patent  to the bifurcation. No significant atheromatous narrowing about the left bifurcation/proximal left ICA. Left ICA patent distally to the skullbase without stenosis, dissection, or occlusion. Vertebral arteries: Both of the vertebral arteries arise from the subclavian arteries. Vertebral artery is tortuous proximally. Right vertebral artery dominant. Minimal focal irregularity at the bilateral V3 segments favored to be chronic and reflect short-segment mild FMD. Vertebral arteries patent within the neck without stenosis, dissection, or occlusion. Skeleton: No acute osseous abnormality. No worrisome lytic or blastic osseous lesions. Other neck: Soft tissues of the neck demonstrate no acute abnormality. Salivary glands normal. No adenopathy. Subcentimeter hypodense right thyroid nodule noted, of doubtful  significance. Thyroid otherwise unremarkable. Upper chest: Visualized upper chest within normal limits. Partially visualized lungs are clear. Review of the MIP images confirms the above findings CTA HEAD FINDINGS Anterior circulation: The petrous, cavernous, and supraclinoid segments widely patent without flow-limiting stenosis. Minimal atherosclerotic change for age within the carotid siphons. ICA termini widely patent. A1 segments patent bilaterally. Anterior communicating artery normal. Anterior cerebral artery is widely patent to their distal aspects. M1 segments patent without stenosis or occlusion. MCA bifurcations normal. No proximal M2 occlusion. Distal MCA branches well opacified and symmetric. Posterior circulation: Vertebral artery is widely patent to the vertebrobasilar junction without stenosis. Posterior inferior cerebral arteries patent bilaterally. Basilar artery widely patent. Superior cerebral arteries patent bilaterally. Both of the posterior cerebral arteries primarily supplied via the basilar artery. Right PCA patent to its distal aspect without stenosis. There is a short-segment moderate to severe proximal left P2 stenosis (series 9, image 96). Left PCA and widely patent distally. Venous sinuses: Patent. Anatomic variants: No significant anatomic variant. No aneurysm or vascular malformation. Delayed phase: No pathologic enhancement. Review of the MIP images confirms the above findings IMPRESSION: 1. Negative CTA of the head and neck. No acute large vessel or proximal arterial branch occlusion. 2. Single short-segment moderate to severe proximal left P2 stenosis. No other high-grade or correctable stenosis identified within the major arterial vasculature of the head and neck. Relatively mild atheromatous disease for patient age. 3. Short-segment mild irregularity involving the V3 segments bilaterally, suspected to be related to mild FMD. 4. Diffuse tortuosity of the major arterial vasculature,  suggesting chronic underlying hypertension. Electronically Signed   By: Jeannine Boga M.D.   On: 02/10/2017 23:57   Dg Chest 2 View  Result Date: 02/10/2017 CLINICAL DATA:  83 y/o F; altered mental status, chest pain, shortness of breath. EXAM: CHEST  2 VIEW COMPARISON:  04/02/2016 chest radiograph FINDINGS: Normal cardiac silhouette. Aortic atherosclerosis with calcification. Clear lungs. No pleural effusion or pneumothorax. Bones are unremarkable. IMPRESSION: No acute pulmonary process identified. Electronically Signed   By: Kristine Garbe M.D.   On: 02/10/2017 19:34   Ct Head Wo Contrast  Result Date: 02/10/2017 CLINICAL DATA:  Pain after fall EXAM: CT HEAD WITHOUT CONTRAST CT CERVICAL SPINE WITHOUT CONTRAST TECHNIQUE: Multidetector CT imaging of the head and cervical spine was performed following the standard protocol without intravenous contrast. Multiplanar CT image reconstructions of the cervical spine were also generated. COMPARISON:  April 02, 2016 FINDINGS: CT HEAD FINDINGS Brain: No subdural, epidural, or subarachnoid hemorrhage. No mass effect or midline shift. Ventricles and sulci are prominent stable. Cerebellum, brainstem, and basal cisterns are normal. White matter changes are identified. There is a lacunar infarct in the left caudate, present previously. It is slightly more prominent today, probably due to difference in slice selection. There is a lacunar infarct in the right thalamus is  more prominent today also, possibly due to difference in slice selection. No acute cortical ischemia or infarct identified. Vascular: No hyperdense vessel or unexpected calcification. Skull: Normal. Negative for fracture or focal lesion. Sinuses/Orbits: No acute finding. Other: None. CT CERVICAL SPINE FINDINGS Alignment: There is 2 mm of anterolisthesis of C7 versus T1 identified. Minimal anterolisthesis of T2 versus T3 identified. No other malalignment. Skull base and vertebrae: No acute  fracture. No primary bone lesion or focal pathologic process. Soft tissues and spinal canal: No prevertebral fluid or swelling. No visible canal hematoma. Disc levels:  Multilevel degenerative disc disease identified. Upper chest: Negative. Other: No other abnormalities. IMPRESSION: 1. No acute traumatic intracranial abnormality. 2. There is a lacunar infarct in the right thalamus which was not appreciated on the previous study. It is possible the difference could be due to slice selection. An MRI could better assess for subtle acute infarcts. No other definitive acute abnormalities seen on today's study. 3. No fracture or traumatic malalignment in the cervical spine. Degenerative changes. Electronically Signed   By: Dorise Bullion III M.D   On: 02/10/2017 18:31   Ct Angio Neck W And/or Wo Contrast  Result Date: 02/10/2017 CLINICAL DATA:  Initial evaluation for bone witnessed fall. Concern for lacunar infarct. EXAM: CT ANGIOGRAPHY HEAD AND NECK TECHNIQUE: Multidetector CT imaging of the head and neck was performed using the standard protocol during bolus administration of intravenous contrast. Multiplanar CT image reconstructions and MIPs were obtained to evaluate the vascular anatomy. Carotid stenosis measurements (when applicable) are obtained utilizing NASCET criteria, using the distal internal carotid diameter as the denominator. CONTRAST:  100 cc of Isovue 370. COMPARISON:  Prior CT from earlier the same day. FINDINGS: CTA NECK FINDINGS Aortic arch: Visualized aortic arch of normal caliber with normal branch pattern. Scattered atheromatous plaque present within the aortic arch and about the origin the great vessels without flow-limiting stenosis. Visualized subclavian artery is are widely patent. Right carotid system: Right common carotid artery tortuous proximally but widely patent to the bifurcation. Mild atheromatous plaque about the proximal right ICA without flow-limiting stenosis. Right ICA tortuous  but patent to the skullbase without stenosis, dissection, or occlusion. Left carotid system: Left common carotid artery tortuous proximally but widely patent to the bifurcation. No significant atheromatous narrowing about the left bifurcation/proximal left ICA. Left ICA patent distally to the skullbase without stenosis, dissection, or occlusion. Vertebral arteries: Both of the vertebral arteries arise from the subclavian arteries. Vertebral artery is tortuous proximally. Right vertebral artery dominant. Minimal focal irregularity at the bilateral V3 segments favored to be chronic and reflect short-segment mild FMD. Vertebral arteries patent within the neck without stenosis, dissection, or occlusion. Skeleton: No acute osseous abnormality. No worrisome lytic or blastic osseous lesions. Other neck: Soft tissues of the neck demonstrate no acute abnormality. Salivary glands normal. No adenopathy. Subcentimeter hypodense right thyroid nodule noted, of doubtful significance. Thyroid otherwise unremarkable. Upper chest: Visualized upper chest within normal limits. Partially visualized lungs are clear. Review of the MIP images confirms the above findings CTA HEAD FINDINGS Anterior circulation: The petrous, cavernous, and supraclinoid segments widely patent without flow-limiting stenosis. Minimal atherosclerotic change for age within the carotid siphons. ICA termini widely patent. A1 segments patent bilaterally. Anterior communicating artery normal. Anterior cerebral artery is widely patent to their distal aspects. M1 segments patent without stenosis or occlusion. MCA bifurcations normal. No proximal M2 occlusion. Distal MCA branches well opacified and symmetric. Posterior circulation: Vertebral artery is widely patent to the vertebrobasilar junction  without stenosis. Posterior inferior cerebral arteries patent bilaterally. Basilar artery widely patent. Superior cerebral arteries patent bilaterally. Both of the posterior  cerebral arteries primarily supplied via the basilar artery. Right PCA patent to its distal aspect without stenosis. There is a short-segment moderate to severe proximal left P2 stenosis (series 9, image 96). Left PCA and widely patent distally. Venous sinuses: Patent. Anatomic variants: No significant anatomic variant. No aneurysm or vascular malformation. Delayed phase: No pathologic enhancement. Review of the MIP images confirms the above findings IMPRESSION: 1. Negative CTA of the head and neck. No acute large vessel or proximal arterial branch occlusion. 2. Single short-segment moderate to severe proximal left P2 stenosis. No other high-grade or correctable stenosis identified within the major arterial vasculature of the head and neck. Relatively mild atheromatous disease for patient age. 3. Short-segment mild irregularity involving the V3 segments bilaterally, suspected to be related to mild FMD. 4. Diffuse tortuosity of the major arterial vasculature, suggesting chronic underlying hypertension. Electronically Signed   By: Jeannine Boga M.D.   On: 02/10/2017 23:57   Ct Cervical Spine Wo Contrast  Result Date: 02/10/2017 CLINICAL DATA:  Pain after fall EXAM: CT HEAD WITHOUT CONTRAST CT CERVICAL SPINE WITHOUT CONTRAST TECHNIQUE: Multidetector CT imaging of the head and cervical spine was performed following the standard protocol without intravenous contrast. Multiplanar CT image reconstructions of the cervical spine were also generated. COMPARISON:  April 02, 2016 FINDINGS: CT HEAD FINDINGS Brain: No subdural, epidural, or subarachnoid hemorrhage. No mass effect or midline shift. Ventricles and sulci are prominent stable. Cerebellum, brainstem, and basal cisterns are normal. White matter changes are identified. There is a lacunar infarct in the left caudate, present previously. It is slightly more prominent today, probably due to difference in slice selection. There is a lacunar infarct in the  right thalamus is more prominent today also, possibly due to difference in slice selection. No acute cortical ischemia or infarct identified. Vascular: No hyperdense vessel or unexpected calcification. Skull: Normal. Negative for fracture or focal lesion. Sinuses/Orbits: No acute finding. Other: None. CT CERVICAL SPINE FINDINGS Alignment: There is 2 mm of anterolisthesis of C7 versus T1 identified. Minimal anterolisthesis of T2 versus T3 identified. No other malalignment. Skull base and vertebrae: No acute fracture. No primary bone lesion or focal pathologic process. Soft tissues and spinal canal: No prevertebral fluid or swelling. No visible canal hematoma. Disc levels:  Multilevel degenerative disc disease identified. Upper chest: Negative. Other: No other abnormalities. IMPRESSION: 1. No acute traumatic intracranial abnormality. 2. There is a lacunar infarct in the right thalamus which was not appreciated on the previous study. It is possible the difference could be due to slice selection. An MRI could better assess for subtle acute infarcts. No other definitive acute abnormalities seen on today's study. 3. No fracture or traumatic malalignment in the cervical spine. Degenerative changes. Electronically Signed   By: Dorise Bullion III M.D   On: 02/10/2017 18:31   Ct Lumbar Spine Wo Contrast  Result Date: 02/10/2017 CLINICAL DATA:  83 y/o  F; fall with lower back pain. EXAM: CT LUMBAR SPINE WITHOUT CONTRAST TECHNIQUE: Multidetector CT imaging of the lumbar spine was performed without intravenous contrast administration. Multiplanar CT image reconstructions were also generated. COMPARISON:  None. FINDINGS: Segmentation: 5 lumbar type vertebrae. Alignment: Normal. Vertebrae: L2 acute superior endplate fracture with moderate 50% loss of height and 7 mm retropulsion of the superior endplate. 8 mm anterior epidural soft tissue thickening at L2 level, probably a small epidural hematoma,  with resultant moderate  canal stenosis. No other acute fracture identified. Paraspinal and other soft tissues: Aortic and iliofemoral calcific atherosclerosis. Disc levels: Mild L3-4, moderate to severe L4-5, and moderate L5-S1 disc space narrowing. L2 through L5 spinous process articulation may represent Baastrup's disease. L2 through L5 small disc bulges. No high-grade bony canal stenosis or foraminal narrowing. IMPRESSION: L2 acute superior endplate fracture with 36% loss of height and 7 mm retropulsion of superior endplate. 8 mm anterior epidural soft tissue thickening at L2 level, probably a small epidural hematoma, with resultant moderate canal stenosis. These results were called by telephone at the time of interpretation on 02/10/2017 at 8:53 pm to Dr. Eliezer Mccoy , who verbally acknowledged these results. Electronically Signed   By: Kristine Garbe M.D.   On: 02/10/2017 20:56   Mr Brain Wo Contrast  Result Date: 02/11/2017 CLINICAL DATA:  Fall and mental status changes. Question age of right thalamic abnormality. EXAM: MRI HEAD WITHOUT CONTRAST TECHNIQUE: Multiplanar, multiecho pulse sequences of the brain and surrounding structures were obtained without intravenous contrast. COMPARISON:  CT examinations done yesterday and 04/02/2016. FINDINGS: Brain: Generalized brain atrophy. Diffusion imaging does not show any acute or subacute infarction. Mild chronic small-vessel changes of the pons. No focal cerebellar insult. Minimal chronic small-vessel change of the thalami and basal ganglia. Moderate chronic small-vessel changes of the cerebral hemispheric white matter. No large vessel territory infarction. No evidence of mass lesion, hemorrhage, obstructive hydrocephalus or extra-axial collection. Ventricular size is in proportion to the degree of generalized brain atrophy. Vascular: Major vessels at the base of the brain show flow. Skull and upper cervical spine: Negative Sinuses/Orbits: Clear/normal Other: None  significant IMPRESSION: No acute or traumatic finding. Brain atrophy. Chronic small-vessel ischemic changes. No evidence of recent thalamic infarction. Electronically Signed   By: Nelson Chimes M.D.   On: 02/11/2017 08:13       Subjective: Pain better. Lumbar TLSO brace applied and tolerating well.   Discharge Exam: Vitals:   02/11/17 2114 02/12/17 0458  BP: (!) 96/48 (!) 118/55  Pulse: 63 61  Resp: 14 16  Temp: 97.9 F (36.6 C) 98.5 F (36.9 C)   Vitals:   02/11/17 1044 02/11/17 1400 02/11/17 2114 02/12/17 0458  BP: (!) 116/54 (!) 110/52 (!) 96/48 (!) 118/55  Pulse: 62 64 63 61  Resp: 15 16 14 16   Temp: 98.2 F (36.8 C) 99.1 F (37.3 C) 97.9 F (36.6 C) 98.5 F (36.9 C)  TempSrc: Oral Oral Oral Oral  SpO2: 97% 97% 93% 93%  Weight:      Height:        Gen: Elderly female, confused, not in distress HEENT:  moist mucosa, supple neck Chest: clear b/l, no added sounds CVS: N S1&S2, no murmurs,  GI: soft, NT, ND Musculoskeletal: warm, no edema, tenderness to pressure over lower back CNS: AAOX1, non focal    The results of significant diagnostics from this hospitalization (including imaging, microbiology, ancillary and laboratory) are listed below for reference.     Microbiology: Recent Results (from the past 240 hour(s))  MRSA PCR Screening     Status: None   Collection Time: 02/11/17  2:51 AM  Result Value Ref Range Status   MRSA by PCR NEGATIVE NEGATIVE Final    Comment:        The GeneXpert MRSA Assay (FDA approved for NASAL specimens only), is one component of a comprehensive MRSA colonization surveillance program. It is not intended to diagnose MRSA infection nor to  guide or monitor treatment for MRSA infections.      Labs: BNP (last 3 results) No results for input(s): BNP in the last 8760 hours. Basic Metabolic Panel:  Recent Labs Lab 02/10/17 1827 02/10/17 1927  NA 133* 133*  K 4.1 4.0  CL 97* 94*  CO2 27  --   GLUCOSE 97 92  BUN 16 14   CREATININE 0.69 0.70  CALCIUM 8.6*  --    Liver Function Tests: No results for input(s): AST, ALT, ALKPHOS, BILITOT, PROT, ALBUMIN in the last 168 hours. No results for input(s): LIPASE, AMYLASE in the last 168 hours. No results for input(s): AMMONIA in the last 168 hours. CBC:  Recent Labs Lab 02/10/17 1827 02/10/17 1927  WBC 8.6  --   NEUTROABS 6.9  --   HGB 11.8* 12.2  HCT 34.5* 36.0  MCV 89.8  --   PLT 163  --    Cardiac Enzymes: No results for input(s): CKTOTAL, CKMB, CKMBINDEX, TROPONINI in the last 168 hours. BNP: Invalid input(s): POCBNP CBG: No results for input(s): GLUCAP in the last 168 hours. D-Dimer No results for input(s): DDIMER in the last 72 hours. Hgb A1c  Recent Labs  02/11/17 0448  HGBA1C 5.1   Lipid Profile  Recent Labs  02/11/17 0448  CHOL 176  HDL 73  LDLCALC 92  TRIG 56  CHOLHDL 2.4   Thyroid function studies No results for input(s): TSH, T4TOTAL, T3FREE, THYROIDAB in the last 72 hours.  Invalid input(s): FREET3 Anemia work up No results for input(s): VITAMINB12, FOLATE, FERRITIN, TIBC, IRON, RETICCTPCT in the last 72 hours. Urinalysis    Component Value Date/Time   COLORURINE YELLOW 02/10/2017 1735   APPEARANCEUR CLEAR 02/10/2017 1735   LABSPEC 1.010 02/10/2017 1735   PHURINE 8.0 02/10/2017 1735   GLUCOSEU NEGATIVE 02/10/2017 1735   GLUCOSEU NEGATIVE 07/01/2015 1647   HGBUR NEGATIVE 02/10/2017 1735   BILIRUBINUR NEGATIVE 02/10/2017 1735   KETONESUR 5 (A) 02/10/2017 1735   PROTEINUR NEGATIVE 02/10/2017 1735   UROBILINOGEN 0.2 07/01/2015 1647   NITRITE NEGATIVE 02/10/2017 1735   LEUKOCYTESUR NEGATIVE 02/10/2017 1735   Sepsis Labs Invalid input(s): PROCALCITONIN,  WBC,  LACTICIDVEN Microbiology Recent Results (from the past 240 hour(s))  MRSA PCR Screening     Status: None   Collection Time: 02/11/17  2:51 AM  Result Value Ref Range Status   MRSA by PCR NEGATIVE NEGATIVE Final    Comment:        The GeneXpert MRSA  Assay (FDA approved for NASAL specimens only), is one component of a comprehensive MRSA colonization surveillance program. It is not intended to diagnose MRSA infection nor to guide or monitor treatment for MRSA infections.      Time coordinating discharge: <30 minutes  SIGNED:   Louellen Molder, MD  [facility] 02/12/2017, 9:40 AM Pager   If 7PM-7AM, please contact night-coverage [email or web address] Password [email or web address]

## 2017-02-12 NOTE — Clinical Social Work Note (Signed)
Clinical Social Work Assessment  Patient Details  Name: Pamela Callahan MRN: [id number] Date of Birth: 10/03/1928  Date of referral:  02/12/17               Reason for consult:  Facility Placement                Permission sought to share information with:  Facility Art therapist granted to share information::  Yes, Verbal Permission Granted  Name::        Agency::     Relationship::     Contact Information:     Housing/Transportation Living arrangements for the past 2 months:  [place] of Information:  Adult Children (SonJori Callahan ) Patient Interpreter Needed:  None Criminal Activity/Legal Involvement Pertinent to Current Situation/Hospitalization:    Significant Relationships:  Adult Children Lives with:  Facility Resident ([facility] ) Do you feel safe going back to the place where you live?  No Need for family participation in patient care:  Yes (Comment)  Care giving concerns:  Son would prefer SNF placement before returning to [place] Worker assessment / plan:  CSW spoke with patients son, Pamela Callahan, via phone regarding discharge plans. Patient is a currently resident at [facility]. PT is currently recommending SNF. Patients family lives in [place] and would prefer a facility in that area but are open to SNF's in [place]. Son agreeable to CSW completing FL2 and sending referrals to requested counties. CSW will complete FL2/pasrr and follow up with bed offers.   Employment status:  Retired Forensic scientist:  [employer] PT Recommendations:  [place] / Referral to community resources:  [place]  Patient/Family's Response to care: Son appreciated CSW.  Patient/Family's Understanding of and Emotional Response to Diagnosis, Current Treatment, and Prognosis:  Understands current treatment and prognosis.   Emotional Assessment Appearance:  Appears  stated age Attitude/Demeanor/Rapport:  Unable to Assess Affect (typically observed):  Unable to Assess Orientation:  Oriented to Self, Oriented to Place Alcohol / Substance use:    Psych involvement (Current and /or in the community):  No (Comment)  Discharge Needs  Concerns to be addressed:  No discharge needs identified Readmission within the last 30 days:  No Current discharge risk:  None Barriers to Discharge:  No Barriers Identified   Pamela Anna, LCSW 02/12/2017, 3:02 PM

## 2017-02-12 NOTE — NC FL2 (Signed)
[place] MEDICAID FL2 LEVEL OF CARE SCREENING TOOL     IDENTIFICATION  Patient Name: Pamela Callahan Birthdate: 12-Oct-1928 Sex: female Admission Date (Current Location): 02/10/2017  [facility] [place] and [place] Number:  Herbalist and Address:  [facility],  [place] [address] [place]      Provider Number: [id number]  Attending Physician Name and Address:  Louellen Molder, MD  Relative Name and Phone Number:       Current Level of Care: Hospital Recommended Level of Care: [place] Prior Approval Number:    Date Approved/Denied:   PASRR Number:    Discharge Plan: SNF    Current Diagnoses: Patient Active Problem List   Diagnosis Date Noted  . Severe dementia 02/12/2017  . Fall 02/11/2017  . Depression with anxiety 02/11/2017  . Closed L2 vertebral fracture ([place]) 02/11/2017  . Traumatic epidural hematoma 02/11/2017  . Memory loss 03/26/2016  . Osteoarthritis of left knee 12/25/2015  . Hearing loss of aging 12/25/2015  . Cough 08/06/2015  . Leg weakness, bilateral 07/01/2015  . Subacute confusional state 07/01/2015  . B12 deficiency 09/17/2014  . Hyponatremia 08/30/2014  . Fatigue 08/30/2014  . Hip hematoma, right 06/12/2014  . Rash and nonspecific skin eruption 03/12/2014  . PAC (premature atrial contraction) 03/23/2013  . Heart murmur 03/06/2013  . RBBB (right bundle branch block) 03/06/2013  . NEOPLASM OF UNCERTAIN BEHAVIOR OF SKIN 09/09/2010  . SOLAR KERATOSIS 03/24/2010  . ALLERGIC RHINITIS 09/26/2008  . Dyslipidemia 01/02/2008  . Anxiety state 01/02/2008  . GERD 01/02/2008  . Situational depression 09/26/2007  . Essential hypertension 09/26/2007  . OSTEOARTHRITIS 09/26/2007  . LOW BACK PAIN 09/26/2007  . OSTEOPENIA 09/26/2007    Orientation RESPIRATION BLADDER Height & Weight     Self, Situation  Normal Incontinent Weight: 143 lb 4.8 oz (65 kg) Height:  5\' 10"  (177.8 cm)  BEHAVIORAL SYMPTOMS/MOOD  NEUROLOGICAL BOWEL NUTRITION STATUS        Diet (Regular )  AMBULATORY STATUS COMMUNICATION OF NEEDS Skin   Limited Assist Verbally Normal                       Personal Care Assistance Level of Assistance  Bathing, Feeding, Dressing Bathing Assistance: Limited assistance Feeding assistance: Independent Dressing Assistance: Limited assistance     Functional Limitations Info             SPECIAL CARE FACTORS FREQUENCY  PT (By licensed PT), OT (By licensed OT)     PT Frequency: 5 OT Frequency: 5            Contractures      Additional Factors Info  Code Status, Allergies Code Status Info: DNR  Allergies Info: LATEX, ACE INHIBITORS, PENICILLINS, YELLOW DYE            Current Medications (02/12/2017):  This is the current hospital active medication list Current Facility-Administered Medications  Medication Dose Route Frequency Provider Last Rate Last Dose  .  stroke: mapping our early stages of recovery book   Does not apply Once Ivor Costa, MD      . acetaminophen (TYLENOL) tablet 650 mg  650 mg Oral Q4H PRN Ivor Costa, MD   650 mg at 02/12/17 1442   Or  . acetaminophen (TYLENOL) solution 650 mg  650 mg Per Tube Q4H PRN Ivor Costa, MD       Or  . acetaminophen (TYLENOL) suppository 650 mg  650 mg Rectal Q4H PRN  Ivor Costa, MD      . clorazepate (TRANXENE) tablet 3.75 mg  3.75 mg Oral BID PRN Ivor Costa, MD      . divalproex (DEPAKOTE) DR tablet 250 mg  250 mg Oral BID Ivor Costa, MD   250 mg at 02/12/17 0908  . donepezil (ARICEPT) tablet 20 mg  20 mg Oral QHS Ivor Costa, MD   20 mg at 02/11/17 2121  . hydrALAZINE (APRESOLINE) injection 5 mg  5 mg Intravenous Q2H PRN Ivor Costa, MD      . loratadine (CLARITIN) tablet 10 mg  10 mg Oral Daily Ivor Costa, MD   10 mg at 02/12/17 0908  . LORazepam (ATIVAN) tablet 0.5 mg  0.5 mg Oral TID Ivor Costa, MD   0.5 mg at 02/12/17 0908  . magnesium gluconate (MAGONATE) tablet 500 mg  500 mg Oral Daily Ivor Costa, MD   500 mg  at 02/12/17 5170  . memantine (NAMENDA) tablet 10 mg  10 mg Oral Daily Ivor Costa, MD   10 mg at 02/12/17 0907  . morphine 4 MG/ML injection 1 mg  1 mg Intravenous Q4H PRN Dhungel, Nishant, MD   1 mg at 02/12/17 0306  . multivitamin (PROSIGHT) tablet   Oral BID Ivor Costa, MD   1 tablet at 02/12/17 [phone]  . oxyCODONE-acetaminophen (PERCOCET/ROXICET) 5-325 MG per tablet 1 tablet  1 tablet Oral Q4H PRN Ivor Costa, MD   1 tablet at 02/12/17 0908  . PARoxetine (PAXIL) tablet 20 mg  20 mg Oral Daily Ivor Costa, MD   20 mg at 02/12/17 0908  . polyethylene glycol (MIRALAX / GLYCOLAX) packet 17 g  17 g Oral Daily PRN Ivor Costa, MD      . sodium chloride 0.9 % bolus 500 mL  500 mL Intravenous Once Dhungel, Nishant, MD 500 mL/hr at 02/12/17 1447 500 mL at 02/12/17 1447  . vitamin B-12 (CYANOCOBALAMIN) tablet 1,000 mcg  1,000 mcg Oral Daily Ivor Costa, MD   1,000 mcg at 02/12/17 9449     Discharge Medications: Please see discharge summary for a list of discharge medications.  Relevant Imaging Results:  Relevant Lab Results:   Additional Information SS#: [id number]  [place]on Anna, LCSW

## 2017-02-12 NOTE — Progress Notes (Signed)
OT Cancellation Note  Patient Details Name: PIERRA SKORA MRN: [id number] DOB: 12-30-1928   Cancelled Treatment:    Reason Eval/Treat Not Completed: Medical issues which prohibited therapy  Noted BP 80/60. Will check on pt later in day or next day for OT eval   Rayshard Schirtzinger, Mickel Baas, [place] [phone] 02/12/2017, 2:02 PM

## 2017-02-12 NOTE — Progress Notes (Signed)
BP 88/60 manual. Pt lethargic but arouses to voice stimuli. MD updated. New orders carried out.

## 2017-02-13 DIAGNOSIS — I959 Hypotension, unspecified: Secondary | ICD-10-CM

## 2017-02-13 MED ORDER — SODIUM CHLORIDE 0.9 % IV SOLN
INTRAVENOUS | Status: AC
  Administered 2017-02-13 – 2017-02-14 (×3): via INTRAVENOUS

## 2017-02-13 NOTE — Progress Notes (Signed)
PROGRESS NOTE                                                                                                                                                                                                             Patient Demographics:    Pamela Callahan, is a 83 y.o. female, DOB - Sep 19, 1928, IRJ:[id number]  Admit date - 02/10/2017   Admitting Physician Ivor Costa, MD  Outpatient Primary MD for the patient is Plotnikov, Evie Lacks, MD  LOS - 2  Outpatient Specialists:None  Chief Complaint  Patient presents with  . Fall  . Back Pain       Brief Narrative   83 year old female with severe dementia, hypertension, hyperlipidemia presented with fall at the memory care unit and complained of back pain. No loss of consciousness. In the ED CT of the head was concerning for lacunar infarct. CT of the lumbar spine showed L2 acute compression fracture and epidural hematoma with moderate canal stenosis. Neurosurgery was consulted who recommended TLSO brace and patient to be a high surgical risk candidate. Recommended outpatient follow-up in 2 weeks. MRI of the brain was negative for acute stroke..   Subjective:    Patient was hypertensive yesterday and given fluid bolus with improvement.   Assessment  & Plan :   Principal Problem: Fall with acute L2 compression fracture - associated retropulsion and moderate canal stenosis. Imaging reviewed by neurosurgeon and considered not to be a surgical candidate. Recommended applying TLSO brace mobilizing her on it. Recommended that she does not need the brace while lying down and can be applied when she is sitting and moving around. She should follow-up with him in 2 weeks after discharge. -upright xray of lumbar spine done with brace applied ,shows stable L2 fracture.  Memory care unit cannot provide PT. Izell  to go to SNF for therapy after discharge..  Active Problems:  Abnormal  head CT Head CT shows possible acute lacunar infarct. Marland Kitchen MRI brain negative for acute stroke. No need for further stroke workup. Neurology signed off.  Hypotension Hold ARB given low blood pressure. Received IV fluid bolus, currently low normal. Will monitor on maintenance fluids.  Severe dementia Continue Namenda and Aricept  Anxiety and depression Stable. Continue home medications.     Family Communication :None at bedside today  Disposition Plan:  to SNF  Consults : Neurology Neurosurgery (Dr. Ronnald Ramp)  Procedures :  CT head, CT angiogram of the head and neck CT lumbar spine MRI brain   DVT Prophylaxis  :  SCDs  Lab Results  Component Value Date   PLT 163 02/10/2017    Antibiotics  :    Anti-infectives    None        Objective:   Vitals:   02/12/17 1300 02/12/17 1600 02/12/17 2029 02/13/17 0501  BP: (!) 86/54 134/70 (!) 141/57 97/78  Pulse: 61 66 61 67  Resp:   18 16  Temp:   98.3 F (36.8 C) 98.8 F (37.1 C)  TempSrc:   Oral Oral  SpO2:   92% 96%  Weight:      Height:        Wt Readings from Last 3 Encounters:  02/11/17 65 kg (143 lb 4.8 oz)  04/14/16 68.9 kg (152 lb)  04/02/16 68.9 kg (152 lb)     Intake/Output Summary (Last 24 hours) at 02/13/17 1043 Last data filed at 02/13/17 0700  Gross per 24 hour  Intake             1200 ml  Output              700 ml  Net              500 ml     Physical Exam Gen.: Elderly female not in distress, very confused HEENT: Moist mucosa, supple neck Chest: Clear bilaterally  CVS: Normal S1 and S2, no murmurs GI: Soft, nondistended, nontender Musculoskeletal: Warm, no edema, mild low back tenderness on exam     Data Review:    CBC  Recent Labs Lab 02/10/17 1827 02/10/17 1927  WBC 8.6  --   HGB 11.8* 12.2  HCT 34.5* 36.0  PLT 163  --   MCV 89.8  --   MCH 30.7  --   MCHC 34.2  --   RDW 13.6  --   LYMPHSABS 0.9  --   MONOABS 0.7  --   EOSABS 0.0  --     BASOSABS 0.0  --     Chemistries   Recent Labs Lab 02/10/17 1827 02/10/17 1927  NA 133* 133*  K 4.1 4.0  CL 97* 94*  CO2 27  --   GLUCOSE 97 92  BUN 16 14  CREATININE 0.69 0.70  CALCIUM 8.6*  --    ------------------------------------------------------------------------------------------------------------------  Recent Labs  02/11/17 0448  CHOL 176  HDL 73  LDLCALC 92  TRIG 56  CHOLHDL 2.4    Lab Results  Component Value Date   HGBA1C 5.1 02/11/2017   ------------------------------------------------------------------------------------------------------------------ No results for input(s): TSH, T4TOTAL, T3FREE, THYROIDAB in the last 72 hours.  Invalid input(s): FREET3 ------------------------------------------------------------------------------------------------------------------ No results for input(s): VITAMINB12, FOLATE, FERRITIN, TIBC, IRON, RETICCTPCT in the last 72 hours.  Coagulation profile  Recent Labs Lab 02/10/17 1910  INR 1.05    No results for input(s): DDIMER in the last 72 hours.  Cardiac Enzymes No results for input(s): CKMB, TROPONINI, MYOGLOBIN in the last 168 hours.  Invalid input(s): CK ------------------------------------------------------------------------------------------------------------------ No results found for: BNP  Inpatient Medications  Scheduled Meds: .  stroke: mapping our early stages of recovery book   Does not apply Once  . divalproex  250 mg Oral BID  . donepezil  20 mg Oral QHS  . loratadine  10 mg Oral Daily  . LORazepam  0.5 mg Oral TID  . magnesium gluconate  500 mg Oral Daily  . memantine  10 mg Oral Daily  . multivitamin   Oral BID  . PARoxetine  20 mg Oral Daily  . vitamin B-12  1,000 mcg Oral Daily   Continuous Infusions: PRN Meds:.acetaminophen **OR** acetaminophen (TYLENOL) oral liquid 160 mg/5 mL **OR** acetaminophen, clorazepate, hydrALAZINE, morphine injection, oxyCODONE-acetaminophen,  polyethylene glycol  Micro Results Recent Results (from the past 240 hour(s))  MRSA PCR Screening     Status: None   Collection Time: 02/11/17  2:51 AM  Result Value Ref Range Status   MRSA by PCR NEGATIVE NEGATIVE Final    Comment:        The GeneXpert MRSA Assay (FDA approved for NASAL specimens only), is one component of a comprehensive MRSA colonization surveillance program. It is not intended to diagnose MRSA infection nor to guide or monitor treatment for MRSA infections.     Radiology Reports Ct Angio Head W Or Wo Contrast  Result Date: 02/10/2017 CLINICAL DATA:  Initial evaluation for bone witnessed fall. Concern for lacunar infarct. EXAM: CT ANGIOGRAPHY HEAD AND NECK TECHNIQUE: Multidetector CT imaging of the head and neck was performed using the standard protocol during bolus administration of intravenous contrast. Multiplanar CT image reconstructions and MIPs were obtained to evaluate the vascular anatomy. Carotid stenosis measurements (when applicable) are obtained utilizing NASCET criteria, using the distal internal carotid diameter as the denominator. CONTRAST:  100 cc of Isovue 370. COMPARISON:  Prior CT from earlier the same day. FINDINGS: CTA NECK FINDINGS Aortic arch: Visualized aortic arch of normal caliber with normal branch pattern. Scattered atheromatous plaque present within the aortic arch and about the origin the great vessels without flow-limiting stenosis. Visualized subclavian artery is are widely patent. Right carotid system: Right common carotid artery tortuous proximally but widely patent to the bifurcation. Mild atheromatous plaque about the proximal right ICA without flow-limiting stenosis. Right ICA tortuous but patent to the skullbase without stenosis, dissection, or occlusion. Left carotid system: Left common carotid artery tortuous proximally but widely patent to the bifurcation. No significant atheromatous narrowing about the left bifurcation/proximal  left ICA. Left ICA patent distally to the skullbase without stenosis, dissection, or occlusion. Vertebral arteries: Both of the vertebral arteries arise from the subclavian arteries. Vertebral artery is tortuous proximally. Right vertebral artery dominant. Minimal focal irregularity at the bilateral V3 segments favored to be chronic and reflect short-segment mild FMD. Vertebral arteries patent within the neck without stenosis, dissection, or occlusion. Skeleton: No acute osseous abnormality. No worrisome lytic or blastic osseous lesions. Other neck: Soft tissues of the neck demonstrate no acute abnormality. Salivary glands normal. No adenopathy. Subcentimeter hypodense right thyroid nodule noted, of doubtful significance. Thyroid otherwise unremarkable. Upper chest: Visualized upper chest within normal limits. Partially visualized lungs are clear. Review of the MIP images confirms the above findings CTA HEAD FINDINGS Anterior circulation: The petrous, cavernous, and supraclinoid segments widely patent without flow-limiting stenosis. Minimal atherosclerotic change for age within the carotid siphons. ICA termini widely patent. A1 segments patent bilaterally. Anterior communicating artery normal. Anterior cerebral artery is widely patent to their distal aspects. M1 segments patent without stenosis or occlusion. MCA bifurcations normal. No proximal M2 occlusion. Distal MCA branches well opacified and symmetric. Posterior circulation: Vertebral artery is widely patent to the vertebrobasilar junction without stenosis. Posterior inferior cerebral arteries patent bilaterally. Basilar artery widely patent. Superior cerebral arteries patent bilaterally. Both of the posterior cerebral arteries primarily supplied via the basilar artery. Right PCA patent to its distal aspect without  stenosis. There is a short-segment moderate to severe proximal left P2 stenosis (series 9, image 96). Left PCA and widely patent distally. Venous  sinuses: Patent. Anatomic variants: No significant anatomic variant. No aneurysm or vascular malformation. Delayed phase: No pathologic enhancement. Review of the MIP images confirms the above findings IMPRESSION: 1. Negative CTA of the head and neck. No acute large vessel or proximal arterial branch occlusion. 2. Single short-segment moderate to severe proximal left P2 stenosis. No other high-grade or correctable stenosis identified within the major arterial vasculature of the head and neck. Relatively mild atheromatous disease for patient age. 3. Short-segment mild irregularity involving the V3 segments bilaterally, suspected to be related to mild FMD. 4. Diffuse tortuosity of the major arterial vasculature, suggesting chronic underlying hypertension. Electronically Signed   By: Jeannine Boga M.D.   On: 02/10/2017 23:57   Dg Chest 2 View  Result Date: 02/10/2017 CLINICAL DATA:  83 y/o F; altered mental status, chest pain, shortness of breath. EXAM: CHEST  2 VIEW COMPARISON:  04/02/2016 chest radiograph FINDINGS: Normal cardiac silhouette. Aortic atherosclerosis with calcification. Clear lungs. No pleural effusion or pneumothorax. Bones are unremarkable. IMPRESSION: No acute pulmonary process identified. Electronically Signed   By: Kristine Garbe M.D.   On: 02/10/2017 19:34   Dg Lumbar Spine Complete  Result Date: 02/12/2017 CLINICAL DATA:  Followup L2 vertebral body fracture. EXAM: LUMBAR SPINE - COMPLETE 4+ VIEW COMPARISON:  Lumbar spine CT dated 02/10/2018. FINDINGS: Five non-rib-bearing lumbar vertebrae. Diffuse osteopenia. Stable 50% L2 superior endplate compression fracture with previously described bony retropulsion. No acute fracture and no subluxation. Atheromatous arterial calcifications. Bilateral hip prostheses. Cholecystectomy clips. IMPRESSION: Stable L2 compression fracture. Electronically Signed   By: Claudie Revering M.D.   On: 02/12/2017 10:49   Ct Head Wo Contrast  Result  Date: 02/10/2017 CLINICAL DATA:  Pain after fall EXAM: CT HEAD WITHOUT CONTRAST CT CERVICAL SPINE WITHOUT CONTRAST TECHNIQUE: Multidetector CT imaging of the head and cervical spine was performed following the standard protocol without intravenous contrast. Multiplanar CT image reconstructions of the cervical spine were also generated. COMPARISON:  April 02, 2016 FINDINGS: CT HEAD FINDINGS Brain: No subdural, epidural, or subarachnoid hemorrhage. No mass effect or midline shift. Ventricles and sulci are prominent stable. Cerebellum, brainstem, and basal cisterns are normal. White matter changes are identified. There is a lacunar infarct in the left caudate, present previously. It is slightly more prominent today, probably due to difference in slice selection. There is a lacunar infarct in the right thalamus is more prominent today also, possibly due to difference in slice selection. No acute cortical ischemia or infarct identified. Vascular: No hyperdense vessel or unexpected calcification. Skull: Normal. Negative for fracture or focal lesion. Sinuses/Orbits: No acute finding. Other: None. CT CERVICAL SPINE FINDINGS Alignment: There is 2 mm of anterolisthesis of C7 versus T1 identified. Minimal anterolisthesis of T2 versus T3 identified. No other malalignment. Skull base and vertebrae: No acute fracture. No primary bone lesion or focal pathologic process. Soft tissues and spinal canal: No prevertebral fluid or swelling. No visible canal hematoma. Disc levels:  Multilevel degenerative disc disease identified. Upper chest: Negative. Other: No other abnormalities. IMPRESSION: 1. No acute traumatic intracranial abnormality. 2. There is a lacunar infarct in the right thalamus which was not appreciated on the previous study. It is possible the difference could be due to slice selection. An MRI could better assess for subtle acute infarcts. No other definitive acute abnormalities seen on today's study. 3. No fracture or  traumatic malalignment in the cervical spine. Degenerative changes. Electronically Signed   By: Dorise Bullion III M.D   On: 02/10/2017 18:31   Ct Angio Neck W And/or Wo Contrast  Result Date: 02/10/2017 CLINICAL DATA:  Initial evaluation for bone witnessed fall. Concern for lacunar infarct. EXAM: CT ANGIOGRAPHY HEAD AND NECK TECHNIQUE: Multidetector CT imaging of the head and neck was performed using the standard protocol during bolus administration of intravenous contrast. Multiplanar CT image reconstructions and MIPs were obtained to evaluate the vascular anatomy. Carotid stenosis measurements (when applicable) are obtained utilizing NASCET criteria, using the distal internal carotid diameter as the denominator. CONTRAST:  100 cc of Isovue 370. COMPARISON:  Prior CT from earlier the same day. FINDINGS: CTA NECK FINDINGS Aortic arch: Visualized aortic arch of normal caliber with normal branch pattern. Scattered atheromatous plaque present within the aortic arch and about the origin the great vessels without flow-limiting stenosis. Visualized subclavian artery is are widely patent. Right carotid system: Right common carotid artery tortuous proximally but widely patent to the bifurcation. Mild atheromatous plaque about the proximal right ICA without flow-limiting stenosis. Right ICA tortuous but patent to the skullbase without stenosis, dissection, or occlusion. Left carotid system: Left common carotid artery tortuous proximally but widely patent to the bifurcation. No significant atheromatous narrowing about the left bifurcation/proximal left ICA. Left ICA patent distally to the skullbase without stenosis, dissection, or occlusion. Vertebral arteries: Both of the vertebral arteries arise from the subclavian arteries. Vertebral artery is tortuous proximally. Right vertebral artery dominant. Minimal focal irregularity at the bilateral V3 segments favored to be chronic and reflect short-segment mild FMD.  Vertebral arteries patent within the neck without stenosis, dissection, or occlusion. Skeleton: No acute osseous abnormality. No worrisome lytic or blastic osseous lesions. Other neck: Soft tissues of the neck demonstrate no acute abnormality. Salivary glands normal. No adenopathy. Subcentimeter hypodense right thyroid nodule noted, of doubtful significance. Thyroid otherwise unremarkable. Upper chest: Visualized upper chest within normal limits. Partially visualized lungs are clear. Review of the MIP images confirms the above findings CTA HEAD FINDINGS Anterior circulation: The petrous, cavernous, and supraclinoid segments widely patent without flow-limiting stenosis. Minimal atherosclerotic change for age within the carotid siphons. ICA termini widely patent. A1 segments patent bilaterally. Anterior communicating artery normal. Anterior cerebral artery is widely patent to their distal aspects. M1 segments patent without stenosis or occlusion. MCA bifurcations normal. No proximal M2 occlusion. Distal MCA branches well opacified and symmetric. Posterior circulation: Vertebral artery is widely patent to the vertebrobasilar junction without stenosis. Posterior inferior cerebral arteries patent bilaterally. Basilar artery widely patent. Superior cerebral arteries patent bilaterally. Both of the posterior cerebral arteries primarily supplied via the basilar artery. Right PCA patent to its distal aspect without stenosis. There is a short-segment moderate to severe proximal left P2 stenosis (series 9, image 96). Left PCA and widely patent distally. Venous sinuses: Patent. Anatomic variants: No significant anatomic variant. No aneurysm or vascular malformation. Delayed phase: No pathologic enhancement. Review of the MIP images confirms the above findings IMPRESSION: 1. Negative CTA of the head and neck. No acute large vessel or proximal arterial branch occlusion. 2. Single short-segment moderate to severe proximal left P2  stenosis. No other high-grade or correctable stenosis identified within the major arterial vasculature of the head and neck. Relatively mild atheromatous disease for patient age. 3. Short-segment mild irregularity involving the V3 segments bilaterally, suspected to be related to mild FMD. 4. Diffuse tortuosity of the major arterial vasculature, suggesting chronic underlying hypertension.  Electronically Signed   By: Jeannine Boga M.D.   On: 02/10/2017 23:57   Ct Cervical Spine Wo Contrast  Result Date: 02/10/2017 CLINICAL DATA:  Pain after fall EXAM: CT HEAD WITHOUT CONTRAST CT CERVICAL SPINE WITHOUT CONTRAST TECHNIQUE: Multidetector CT imaging of the head and cervical spine was performed following the standard protocol without intravenous contrast. Multiplanar CT image reconstructions of the cervical spine were also generated. COMPARISON:  April 02, 2016 FINDINGS: CT HEAD FINDINGS Brain: No subdural, epidural, or subarachnoid hemorrhage. No mass effect or midline shift. Ventricles and sulci are prominent stable. Cerebellum, brainstem, and basal cisterns are normal. White matter changes are identified. There is a lacunar infarct in the left caudate, present previously. It is slightly more prominent today, probably due to difference in slice selection. There is a lacunar infarct in the right thalamus is more prominent today also, possibly due to difference in slice selection. No acute cortical ischemia or infarct identified. Vascular: No hyperdense vessel or unexpected calcification. Skull: Normal. Negative for fracture or focal lesion. Sinuses/Orbits: No acute finding. Other: None. CT CERVICAL SPINE FINDINGS Alignment: There is 2 mm of anterolisthesis of C7 versus T1 identified. Minimal anterolisthesis of T2 versus T3 identified. No other malalignment. Skull base and vertebrae: No acute fracture. No primary bone lesion or focal pathologic process. Soft tissues and spinal canal: No prevertebral fluid or  swelling. No visible canal hematoma. Disc levels:  Multilevel degenerative disc disease identified. Upper chest: Negative. Other: No other abnormalities. IMPRESSION: 1. No acute traumatic intracranial abnormality. 2. There is a lacunar infarct in the right thalamus which was not appreciated on the previous study. It is possible the difference could be due to slice selection. An MRI could better assess for subtle acute infarcts. No other definitive acute abnormalities seen on today's study. 3. No fracture or traumatic malalignment in the cervical spine. Degenerative changes. Electronically Signed   By: Dorise Bullion III M.D   On: 02/10/2017 18:31   Ct Lumbar Spine Wo Contrast  Result Date: 02/10/2017 CLINICAL DATA:  83 y/o  F; fall with lower back pain. EXAM: CT LUMBAR SPINE WITHOUT CONTRAST TECHNIQUE: Multidetector CT imaging of the lumbar spine was performed without intravenous contrast administration. Multiplanar CT image reconstructions were also generated. COMPARISON:  None. FINDINGS: Segmentation: 5 lumbar type vertebrae. Alignment: Normal. Vertebrae: L2 acute superior endplate fracture with moderate 50% loss of height and 7 mm retropulsion of the superior endplate. 8 mm anterior epidural soft tissue thickening at L2 level, probably a small epidural hematoma, with resultant moderate canal stenosis. No other acute fracture identified. Paraspinal and other soft tissues: Aortic and iliofemoral calcific atherosclerosis. Disc levels: Mild L3-4, moderate to severe L4-5, and moderate L5-S1 disc space narrowing. L2 through L5 spinous process articulation may represent Baastrup's disease. L2 through L5 small disc bulges. No high-grade bony canal stenosis or foraminal narrowing. IMPRESSION: L2 acute superior endplate fracture with 16% loss of height and 7 mm retropulsion of superior endplate. 8 mm anterior epidural soft tissue thickening at L2 level, probably a small epidural hematoma, with resultant moderate canal  stenosis. These results were called by telephone at the time of interpretation on 02/10/2017 at 8:53 pm to Dr. Eliezer Mccoy , who verbally acknowledged these results. Electronically Signed   By: Kristine Garbe M.D.   On: 02/10/2017 20:56   Mr Brain Wo Contrast  Result Date: 02/11/2017 CLINICAL DATA:  Fall and mental status changes. Question age of right thalamic abnormality. EXAM: MRI HEAD WITHOUT CONTRAST TECHNIQUE: Multiplanar,  multiecho pulse sequences of the brain and surrounding structures were obtained without intravenous contrast. COMPARISON:  CT examinations done yesterday and 04/02/2016. FINDINGS: Brain: Generalized brain atrophy. Diffusion imaging does not show any acute or subacute infarction. Mild chronic small-vessel changes of the pons. No focal cerebellar insult. Minimal chronic small-vessel change of the thalami and basal ganglia. Moderate chronic small-vessel changes of the cerebral hemispheric white matter. No large vessel territory infarction. No evidence of mass lesion, hemorrhage, obstructive hydrocephalus or extra-axial collection. Ventricular size is in proportion to the degree of generalized brain atrophy. Vascular: Major vessels at the base of the brain show flow. Skull and upper cervical spine: Negative Sinuses/Orbits: Clear/normal Other: None significant IMPRESSION: No acute or traumatic finding. Brain atrophy. Chronic small-vessel ischemic changes. No evidence of recent thalamic infarction. Electronically Signed   By: Nelson Chimes M.D.   On: 02/11/2017 08:13    Time Spent in minutes  25   Louellen Molder M.D on 02/13/2017 at 10:43 AM  Between 7am to 7pm - Pager - [phone]  After 7pm go to [email or web address] - password [facility]  [facility] -  Office  [phone]

## 2017-02-13 NOTE — Evaluation (Signed)
Occupational Therapy Evaluation Patient Details Name: Pamela Callahan MRN: [id number] DOB: Apr 14, 1929 Today's Date: 02/13/2017    History of Present Illness  Pt is a 83 y.o. female with medical history significant of dementia, hypertension, hyperlipidemia, GERD, depression, anxiety, and diverticulitis. Pt currently admitted for a fall resulting in significant back pain and a compression fx of L2.   Clinical Impression   Pt admitted s/p a fall. Pt currently with functional limitations due to the deficits listed below (see OT Problem List).  Pt will benefit from skilled OT to increase their safety and independence with ADL and functional mobility for ADL to facilitate discharge to venue listed below.      Follow Up Recommendations  SNF    Equipment Recommendations  None recommended by OT       Precautions / Restrictions Precautions Precautions: Back;Fall Required Braces or Orthoses: Spinal Brace Spinal Brace: Thoracolumbosacral orthotic;Applied in sitting position Restrictions Weight Bearing Restrictions: No      Mobility Bed Mobility Overal bed mobility: Needs Assistance Bed Mobility: Rolling;Sidelying to Sit;Sit to Supine Rolling: Min assist Sidelying to sit: Min assist   Sit to supine: Min assist   General bed mobility comments: Pt requires min assist in log rolling technique to transition from supine to sitting   Transfers                 General transfer comment: Pt requires verbal cues and support for stability while transferring     Balance Overall balance assessment: History of Falls;Needs assistance         Standing balance support: Bilateral upper extremity supported Standing balance-Leahy Scale: Poor Standing balance comment: Pt requires RW while standing                            ADL either performed or assessed with clinical judgement   ADL Overall ADL's : Needs assistance/impaired     Grooming: Moderate assistance;Bed level                                 General ADL Comments: Upon transitioning to EOB pt wanted to return to supine.  Pt stated she was cold and would not participate further     Vision Patient Visual Report: No change from baseline              Pertinent Vitals/Pain Faces Pain Scale: Hurts a little bit Pain Location: back Pain Descriptors / Indicators: Aching;Sore Pain Intervention(s): Monitored during session;Limited activity within patient's tolerance           Communication Communication Communication: HOH   Cognition Arousal/Alertness: Awake/alert Behavior During Therapy: WFL for tasks assessed/performed Overall Cognitive Status: History of cognitive impairments - at baseline                                 General Comments: Pt diagnosed with dementia    General Comments               Home Living Family/patient expects to be discharged to:: Assisted living                             Home Equipment: Walker - 2 wheels          Prior Functioning/Environment Level of Independence: Needs assistance  Gait / Transfers Assistance Needed: Pt was under supervision for all gait and transfers within a memory care unit  ADL's / Homemaking Assistance Needed: Pt was under supervision for bathing, dressing, and other ADLs            OT Problem List: Decreased strength;Decreased activity tolerance;Pain;Impaired balance (sitting and/or standing);Decreased safety awareness      OT Treatment/Interventions: Self-care/ADL training;Patient/family education;DME and/or AE instruction    OT Goals(Current goals can be found in the care plan section) Acute Rehab OT Goals Patient Stated Goal: did not state OT Goal Formulation: With patient Time For Goal Achievement: 02/27/17 Potential to Achieve Goals: Good  OT Frequency: Min 2X/week   Barriers to D/C: Decreased caregiver support          Co-evaluation              AM-PAC PT "6  Clicks" Daily Activity     Outcome Measure Help from another person eating meals?: A Little Help from another person taking care of personal grooming?: A Little Help from another person toileting, which includes using toliet, bedpan, or urinal?: Total Help from another person bathing (including washing, rinsing, drying)?: Total Help from another person to put on and taking off regular upper body clothing?: A Lot Help from another person to put on and taking off regular lower body clothing?: Total 6 Click Score: 11   End of Session Nurse Communication: Mobility status  Activity Tolerance: Patient limited by fatigue Patient left: in bed;with call bell/phone within reach;with bed alarm set  OT Visit Diagnosis: Muscle weakness (generalized) (M62.81);Unsteadiness on feet (R26.81);History of falling (Z91.81)                Time: 8250-5397 OT Time Calculation (min): 14 min Charges:  OT General Charges $OT Visit: 1 Procedure OT Evaluation $OT Eval Moderate Complexity: 1 Procedure G-Codes:     Kari Baars, OT [phone]  Payton Mccallum D 02/13/2017, 12:44 PM

## 2017-02-14 DIAGNOSIS — F0390 Unspecified dementia without behavioral disturbance: Secondary | ICD-10-CM

## 2017-02-14 NOTE — Care Management Note (Signed)
Case Management Note  Patient Details  Name: Pamela Callahan MRN: [id number] Date of Birth: 09/27/1928  Subjective/Objective:  83 y/o f admitted w/Fall. From ALF. PT recc SNF. CSW following for SNF-awaiting Passr, hand mitts.                  Action/Plan:d/c SNF.   Expected Discharge Date:                Expected Discharge Plan:  Skilled Nursing Facility  In-House Referral:  Clinical Social Work  Discharge planning Services  CM Consult  Post Acute Care Choice:    Choice offered to:     DME Arranged:    DME Agency:     HH Arranged:    [place] Agency:     Status of Service:  Completed, signed off  If discussed at [employer], dates discussed:    Additional Comments:  Dessa Phi, RN 02/14/2017, 2:24 PM

## 2017-02-14 NOTE — Progress Notes (Signed)
CSW assisting with dc planning, SNF bed offers provided to patient's son Kinnedy Mongiello [phone]). Patient currently in restraints (hand mitts), need to be discontinued for 24 hours prior to dc to SNF. Patient awaiting [facility]. CSW will continue to follow to assist with discharge planning.  Abundio Miu, [place] Social Worker [facility] Cell#: [phone]

## 2017-02-14 NOTE — Progress Notes (Signed)
PROGRESS NOTE                                                                                                                                                                                                             Patient Demographics:    Pamela Callahan, is a 83 y.o. female, DOB - 10-27-1928, PQZ:[id number]  Admit date - 02/10/2017   Admitting Physician Ivor Costa, MD  Outpatient Primary MD for the patient is Plotnikov, Evie Lacks, MD  LOS - 3  Outpatient Specialists:None  Chief Complaint  Patient presents with  . Fall  . Back Pain       Brief Narrative   83 year old female with severe dementia, hypertension, hyperlipidemia presented with fall at the memory care unit and complained of back pain. No loss of consciousness. In the ED CT of the head was concerning for lacunar infarct. CT of the lumbar spine showed L2 acute compression fracture and epidural hematoma with moderate canal stenosis. Neurosurgery was consulted who recommended TLSO brace and patient to be a high surgical risk candidate. Recommended outpatient follow-up in 2 weeks. MRI of the brain was negative for acute stroke..   Subjective:    No overnight events. Blood pressure stable.   Assessment  & Plan :   Principal Problem: Fall with acute L2 compression fracture - associated retropulsion and moderate canal stenosis. Imaging reviewed by neurosurgeon and considered not to be a surgical candidate. Recommended applying TLSO brace mobilizing her on it. Recommended that she does not need the brace while lying down and can be applied when she is sitting and moving around. She should follow-up with him in 2 weeks after discharge. -upright xray of lumbar spine done with brace applied ,shows stable L2 fracture.  Memory care unit cannot provide PT. Needs to go to SNF for physical therapy. Patient has mittens applied since been trying to remove her IV line out.  She needs to be off them for 24 hours before she can be discharged to SNF. Will discontinue them.  Active Problems:  Abnormal head CT Head CT shows possible acute lacunar infarct. [facility] Kitchen MRI brain negative for acute stroke. No need for further stroke workup. Neurology signed off.  Hypotension ARB held due to low blood pressure. Improved with IV fluids.   Severe dementia Continue Namenda and Aricept  Anxiety and depression Stable.  Continue home medications.     Family Communication :None at bedside  Disposition Plan:  SNF tomorrow    Consults : Neurology Neurosurgery (Dr. Ronnald Ramp)  Procedures :  CT head, CT angiogram of the head and neck CT lumbar spine MRI brain   DVT Prophylaxis  :  SCDs  Lab Results  Component Value Date   PLT 163 02/10/2017    Antibiotics  :    Anti-infectives    None        Objective:   Vitals:   02/13/17 1415 02/13/17 2105 02/14/17 0441 02/14/17 1349  BP: 110/66 (!) 142/64 (!) 143/62 139/61  Pulse: 67 66 60 65  Resp: 18 18 17 16   Temp: 97.7 F (36.5 C) 99 F (37.2 C) 97.8 F (36.6 C) 98.6 F (37 C)  TempSrc: Oral Oral Oral Oral  SpO2: 95% 95% 98% 97%  Weight:      Height:        Wt Readings from Last 3 Encounters:  02/11/17 65 kg (143 lb 4.8 oz)  04/14/16 68.9 kg (152 lb)  04/02/16 68.9 kg (152 lb)     Intake/Output Summary (Last 24 hours) at 02/14/17 1419 Last data filed at 02/14/17 0826  Gross per 24 hour  Intake             1610 ml  Output             1250 ml  Net              360 ml     Physical Exam Gen.: Not in distress, confused HEENT: Moist mucosa, supple neck Chest: Clear bilaterally CVS: Normal S1 and S2 is GI: Soft, nondistended, nontender Musculoskeletal: Warm, no edema     Data Review:    CBC  Recent Labs Lab 02/10/17 1827 02/10/17 1927  WBC 8.6  --   HGB 11.8* 12.2  HCT 34.5* 36.0  PLT 163  --   MCV 89.8  --   MCH 30.7  --   MCHC 34.2  --   RDW 13.6  --     LYMPHSABS 0.9  --   MONOABS 0.7  --   EOSABS 0.0  --   BASOSABS 0.0  --     Chemistries   Recent Labs Lab 02/10/17 1827 02/10/17 1927  NA 133* 133*  K 4.1 4.0  CL 97* 94*  CO2 27  --   GLUCOSE 97 92  BUN 16 14  CREATININE 0.69 0.70  CALCIUM 8.6*  --    ------------------------------------------------------------------------------------------------------------------ No results for input(s): CHOL, HDL, LDLCALC, TRIG, CHOLHDL, LDLDIRECT in the last 72 hours.  Lab Results  Component Value Date   HGBA1C 5.1 02/11/2017   ------------------------------------------------------------------------------------------------------------------ No results for input(s): TSH, T4TOTAL, T3FREE, THYROIDAB in the last 72 hours.  Invalid input(s): FREET3 ------------------------------------------------------------------------------------------------------------------ No results for input(s): VITAMINB12, FOLATE, FERRITIN, TIBC, IRON, RETICCTPCT in the last 72 hours.  Coagulation profile  Recent Labs Lab 02/10/17 1910  INR 1.05    No results for input(s): DDIMER in the last 72 hours.  Cardiac Enzymes No results for input(s): CKMB, TROPONINI, MYOGLOBIN in the last 168 hours.  Invalid input(s): CK ------------------------------------------------------------------------------------------------------------------ No results found for: BNP  Inpatient Medications  Scheduled Meds: .  stroke: mapping our early stages of recovery book   Does not apply Once  . divalproex  250 mg Oral BID  . donepezil  20 mg Oral QHS  . loratadine  10 mg Oral Daily  . LORazepam  0.5  mg Oral TID  . magnesium gluconate  500 mg Oral Daily  . memantine  10 mg Oral Daily  . multivitamin   Oral BID  . PARoxetine  20 mg Oral Daily  . vitamin B-12  1,000 mcg Oral Daily   Continuous Infusions: PRN Meds:.acetaminophen **OR** acetaminophen (TYLENOL) oral liquid 160 mg/5 mL **OR** acetaminophen, clorazepate,  morphine injection, oxyCODONE-acetaminophen, polyethylene glycol  Micro Results Recent Results (from the past 240 hour(s))  MRSA PCR Screening     Status: None   Collection Time: 02/11/17  2:51 AM  Result Value Ref Range Status   MRSA by PCR NEGATIVE NEGATIVE Final    Comment:        The GeneXpert MRSA Assay (FDA approved for NASAL specimens only), is one component of a comprehensive MRSA colonization surveillance program. It is not intended to diagnose MRSA infection nor to guide or monitor treatment for MRSA infections.     Radiology Reports Ct Angio Head W Or Wo Contrast  Result Date: 02/10/2017 CLINICAL DATA:  Initial evaluation for bone witnessed fall. Concern for lacunar infarct. EXAM: CT ANGIOGRAPHY HEAD AND NECK TECHNIQUE: Multidetector CT imaging of the head and neck was performed using the standard protocol during bolus administration of intravenous contrast. Multiplanar CT image reconstructions and MIPs were obtained to evaluate the vascular anatomy. Carotid stenosis measurements (when applicable) are obtained utilizing NASCET criteria, using the distal internal carotid diameter as the denominator. CONTRAST:  100 cc of Isovue 370. COMPARISON:  Prior CT from earlier the same day. FINDINGS: CTA NECK FINDINGS Aortic arch: Visualized aortic arch of normal caliber with normal branch pattern. Scattered atheromatous plaque present within the aortic arch and about the origin the great vessels without flow-limiting stenosis. Visualized subclavian artery is are widely patent. Right carotid system: Right common carotid artery tortuous proximally but widely patent to the bifurcation. Mild atheromatous plaque about the proximal right ICA without flow-limiting stenosis. Right ICA tortuous but patent to the skullbase without stenosis, dissection, or occlusion. Left carotid system: Left common carotid artery tortuous proximally but widely patent to the bifurcation. No significant atheromatous  narrowing about the left bifurcation/proximal left ICA. Left ICA patent distally to the skullbase without stenosis, dissection, or occlusion. Vertebral arteries: Both of the vertebral arteries arise from the subclavian arteries. Vertebral artery is tortuous proximally. Right vertebral artery dominant. Minimal focal irregularity at the bilateral V3 segments favored to be chronic and reflect short-segment mild FMD. Vertebral arteries patent within the neck without stenosis, dissection, or occlusion. Skeleton: No acute osseous abnormality. No worrisome lytic or blastic osseous lesions. Other neck: Soft tissues of the neck demonstrate no acute abnormality. Salivary glands normal. No adenopathy. Subcentimeter hypodense right thyroid nodule noted, of doubtful significance. Thyroid otherwise unremarkable. Upper chest: Visualized upper chest within normal limits. Partially visualized lungs are clear. Review of the MIP images confirms the above findings CTA HEAD FINDINGS Anterior circulation: The petrous, cavernous, and supraclinoid segments widely patent without flow-limiting stenosis. Minimal atherosclerotic change for age within the carotid siphons. ICA termini widely patent. A1 segments patent bilaterally. Anterior communicating artery normal. Anterior cerebral artery is widely patent to their distal aspects. M1 segments patent without stenosis or occlusion. MCA bifurcations normal. No proximal M2 occlusion. Distal MCA branches well opacified and symmetric. Posterior circulation: Vertebral artery is widely patent to the vertebrobasilar junction without stenosis. Posterior inferior cerebral arteries patent bilaterally. Basilar artery widely patent. Superior cerebral arteries patent bilaterally. Both of the posterior cerebral arteries primarily supplied via the basilar artery. Right  PCA patent to its distal aspect without stenosis. There is a short-segment moderate to severe proximal left P2 stenosis (series 9, image 96).  Left PCA and widely patent distally. Venous sinuses: Patent. Anatomic variants: No significant anatomic variant. No aneurysm or vascular malformation. Delayed phase: No pathologic enhancement. Review of the MIP images confirms the above findings IMPRESSION: 1. Negative CTA of the head and neck. No acute large vessel or proximal arterial branch occlusion. 2. Single short-segment moderate to severe proximal left P2 stenosis. No other high-grade or correctable stenosis identified within the major arterial vasculature of the head and neck. Relatively mild atheromatous disease for patient age. 3. Short-segment mild irregularity involving the V3 segments bilaterally, suspected to be related to mild FMD. 4. Diffuse tortuosity of the major arterial vasculature, suggesting chronic underlying hypertension. Electronically Signed   By: Jeannine Boga M.D.   On: 02/10/2017 23:57   Dg Chest 2 View  Result Date: 02/10/2017 CLINICAL DATA:  83 y/o F; altered mental status, chest pain, shortness of breath. EXAM: CHEST  2 VIEW COMPARISON:  04/02/2016 chest radiograph FINDINGS: Normal cardiac silhouette. Aortic atherosclerosis with calcification. Clear lungs. No pleural effusion or pneumothorax. Bones are unremarkable. IMPRESSION: No acute pulmonary process identified. Electronically Signed   By: Kristine Garbe M.D.   On: 02/10/2017 19:34   Dg Lumbar Spine Complete  Result Date: 02/12/2017 CLINICAL DATA:  Followup L2 vertebral body fracture. EXAM: LUMBAR SPINE - COMPLETE 4+ VIEW COMPARISON:  Lumbar spine CT dated 02/10/2018. FINDINGS: Five non-rib-bearing lumbar vertebrae. Diffuse osteopenia. Stable 50% L2 superior endplate compression fracture with previously described bony retropulsion. No acute fracture and no subluxation. Atheromatous arterial calcifications. Bilateral hip prostheses. Cholecystectomy clips. IMPRESSION: Stable L2 compression fracture. Electronically Signed   By: Claudie Revering M.D.   On:  02/12/2017 10:49   Ct Head Wo Contrast  Result Date: 02/10/2017 CLINICAL DATA:  Pain after fall EXAM: CT HEAD WITHOUT CONTRAST CT CERVICAL SPINE WITHOUT CONTRAST TECHNIQUE: Multidetector CT imaging of the head and cervical spine was performed following the standard protocol without intravenous contrast. Multiplanar CT image reconstructions of the cervical spine were also generated. COMPARISON:  April 02, 2016 FINDINGS: CT HEAD FINDINGS Brain: No subdural, epidural, or subarachnoid hemorrhage. No mass effect or midline shift. Ventricles and sulci are prominent stable. Cerebellum, brainstem, and basal cisterns are normal. White matter changes are identified. There is a lacunar infarct in the left caudate, present previously. It is slightly more prominent today, probably due to difference in slice selection. There is a lacunar infarct in the right thalamus is more prominent today also, possibly due to difference in slice selection. No acute cortical ischemia or infarct identified. Vascular: No hyperdense vessel or unexpected calcification. Skull: Normal. Negative for fracture or focal lesion. Sinuses/Orbits: No acute finding. Other: None. CT CERVICAL SPINE FINDINGS Alignment: There is 2 mm of anterolisthesis of C7 versus T1 identified. Minimal anterolisthesis of T2 versus T3 identified. No other malalignment. Skull base and vertebrae: No acute fracture. No primary bone lesion or focal pathologic process. Soft tissues and spinal canal: No prevertebral fluid or swelling. No visible canal hematoma. Disc levels:  Multilevel degenerative disc disease identified. Upper chest: Negative. Other: No other abnormalities. IMPRESSION: 1. No acute traumatic intracranial abnormality. 2. There is a lacunar infarct in the right thalamus which was not appreciated on the previous study. It is possible the difference could be due to slice selection. An MRI could better assess for subtle acute infarcts. No other definitive acute  abnormalities seen  on today's study. 3. No fracture or traumatic malalignment in the cervical spine. Degenerative changes. Electronically Signed   By: Dorise Bullion III M.D   On: 02/10/2017 18:31   Ct Angio Neck W And/or Wo Contrast  Result Date: 02/10/2017 CLINICAL DATA:  Initial evaluation for bone witnessed fall. Concern for lacunar infarct. EXAM: CT ANGIOGRAPHY HEAD AND NECK TECHNIQUE: Multidetector CT imaging of the head and neck was performed using the standard protocol during bolus administration of intravenous contrast. Multiplanar CT image reconstructions and MIPs were obtained to evaluate the vascular anatomy. Carotid stenosis measurements (when applicable) are obtained utilizing NASCET criteria, using the distal internal carotid diameter as the denominator. CONTRAST:  100 cc of Isovue 370. COMPARISON:  Prior CT from earlier the same day. FINDINGS: CTA NECK FINDINGS Aortic arch: Visualized aortic arch of normal caliber with normal branch pattern. Scattered atheromatous plaque present within the aortic arch and about the origin the great vessels without flow-limiting stenosis. Visualized subclavian artery is are widely patent. Right carotid system: Right common carotid artery tortuous proximally but widely patent to the bifurcation. Mild atheromatous plaque about the proximal right ICA without flow-limiting stenosis. Right ICA tortuous but patent to the skullbase without stenosis, dissection, or occlusion. Left carotid system: Left common carotid artery tortuous proximally but widely patent to the bifurcation. No significant atheromatous narrowing about the left bifurcation/proximal left ICA. Left ICA patent distally to the skullbase without stenosis, dissection, or occlusion. Vertebral arteries: Both of the vertebral arteries arise from the subclavian arteries. Vertebral artery is tortuous proximally. Right vertebral artery dominant. Minimal focal irregularity at the bilateral V3 segments favored to  be chronic and reflect short-segment mild FMD. Vertebral arteries patent within the neck without stenosis, dissection, or occlusion. Skeleton: No acute osseous abnormality. No worrisome lytic or blastic osseous lesions. Other neck: Soft tissues of the neck demonstrate no acute abnormality. Salivary glands normal. No adenopathy. Subcentimeter hypodense right thyroid nodule noted, of doubtful significance. Thyroid otherwise unremarkable. Upper chest: Visualized upper chest within normal limits. Partially visualized lungs are clear. Review of the MIP images confirms the above findings CTA HEAD FINDINGS Anterior circulation: The petrous, cavernous, and supraclinoid segments widely patent without flow-limiting stenosis. Minimal atherosclerotic change for age within the carotid siphons. ICA termini widely patent. A1 segments patent bilaterally. Anterior communicating artery normal. Anterior cerebral artery is widely patent to their distal aspects. M1 segments patent without stenosis or occlusion. MCA bifurcations normal. No proximal M2 occlusion. Distal MCA branches well opacified and symmetric. Posterior circulation: Vertebral artery is widely patent to the vertebrobasilar junction without stenosis. Posterior inferior cerebral arteries patent bilaterally. Basilar artery widely patent. Superior cerebral arteries patent bilaterally. Both of the posterior cerebral arteries primarily supplied via the basilar artery. Right PCA patent to its distal aspect without stenosis. There is a short-segment moderate to severe proximal left P2 stenosis (series 9, image 96). Left PCA and widely patent distally. Venous sinuses: Patent. Anatomic variants: No significant anatomic variant. No aneurysm or vascular malformation. Delayed phase: No pathologic enhancement. Review of the MIP images confirms the above findings IMPRESSION: 1. Negative CTA of the head and neck. No acute large vessel or proximal arterial branch occlusion. 2. Single  short-segment moderate to severe proximal left P2 stenosis. No other high-grade or correctable stenosis identified within the major arterial vasculature of the head and neck. Relatively mild atheromatous disease for patient age. 3. Short-segment mild irregularity involving the V3 segments bilaterally, suspected to be related to mild FMD. 4. Diffuse tortuosity of the  major arterial vasculature, suggesting chronic underlying hypertension. Electronically Signed   By: Jeannine Boga M.D.   On: 02/10/2017 23:57   Ct Cervical Spine Wo Contrast  Result Date: 02/10/2017 CLINICAL DATA:  Pain after fall EXAM: CT HEAD WITHOUT CONTRAST CT CERVICAL SPINE WITHOUT CONTRAST TECHNIQUE: Multidetector CT imaging of the head and cervical spine was performed following the standard protocol without intravenous contrast. Multiplanar CT image reconstructions of the cervical spine were also generated. COMPARISON:  April 02, 2016 FINDINGS: CT HEAD FINDINGS Brain: No subdural, epidural, or subarachnoid hemorrhage. No mass effect or midline shift. Ventricles and sulci are prominent stable. Cerebellum, brainstem, and basal cisterns are normal. White matter changes are identified. There is a lacunar infarct in the left caudate, present previously. It is slightly more prominent today, probably due to difference in slice selection. There is a lacunar infarct in the right thalamus is more prominent today also, possibly due to difference in slice selection. No acute cortical ischemia or infarct identified. Vascular: No hyperdense vessel or unexpected calcification. Skull: Normal. Negative for fracture or focal lesion. Sinuses/Orbits: No acute finding. Other: None. CT CERVICAL SPINE FINDINGS Alignment: There is 2 mm of anterolisthesis of C7 versus T1 identified. Minimal anterolisthesis of T2 versus T3 identified. No other malalignment. Skull base and vertebrae: No acute fracture. No primary bone lesion or focal pathologic process. Soft  tissues and spinal canal: No prevertebral fluid or swelling. No visible canal hematoma. Disc levels:  Multilevel degenerative disc disease identified. Upper chest: Negative. Other: No other abnormalities. IMPRESSION: 1. No acute traumatic intracranial abnormality. 2. There is a lacunar infarct in the right thalamus which was not appreciated on the previous study. It is possible the difference could be due to slice selection. An MRI could better assess for subtle acute infarcts. No other definitive acute abnormalities seen on today's study. 3. No fracture or traumatic malalignment in the cervical spine. Degenerative changes. Electronically Signed   By: Dorise Bullion III M.D   On: 02/10/2017 18:31   Ct Lumbar Spine Wo Contrast  Result Date: 02/10/2017 CLINICAL DATA:  83 y/o  F; fall with lower back pain. EXAM: CT LUMBAR SPINE WITHOUT CONTRAST TECHNIQUE: Multidetector CT imaging of the lumbar spine was performed without intravenous contrast administration. Multiplanar CT image reconstructions were also generated. COMPARISON:  None. FINDINGS: Segmentation: 5 lumbar type vertebrae. Alignment: Normal. Vertebrae: L2 acute superior endplate fracture with moderate 50% loss of height and 7 mm retropulsion of the superior endplate. 8 mm anterior epidural soft tissue thickening at L2 level, probably a small epidural hematoma, with resultant moderate canal stenosis. No other acute fracture identified. Paraspinal and other soft tissues: Aortic and iliofemoral calcific atherosclerosis. Disc levels: Mild L3-4, moderate to severe L4-5, and moderate L5-S1 disc space narrowing. L2 through L5 spinous process articulation may represent Baastrup's disease. L2 through L5 small disc bulges. No high-grade bony canal stenosis or foraminal narrowing. IMPRESSION: L2 acute superior endplate fracture with 56% loss of height and 7 mm retropulsion of superior endplate. 8 mm anterior epidural soft tissue thickening at L2 level, probably a  small epidural hematoma, with resultant moderate canal stenosis. These results were called by telephone at the time of interpretation on 02/10/2017 at 8:53 pm to Dr. Eliezer Mccoy , who verbally acknowledged these results. Electronically Signed   By: Kristine Garbe M.D.   On: 02/10/2017 20:56   Mr Brain Wo Contrast  Result Date: 02/11/2017 CLINICAL DATA:  Fall and mental status changes. Question age of right thalamic abnormality.  EXAM: MRI HEAD WITHOUT CONTRAST TECHNIQUE: Multiplanar, multiecho pulse sequences of the brain and surrounding structures were obtained without intravenous contrast. COMPARISON:  CT examinations done yesterday and 04/02/2016. FINDINGS: Brain: Generalized brain atrophy. Diffusion imaging does not show any acute or subacute infarction. Mild chronic small-vessel changes of the pons. No focal cerebellar insult. Minimal chronic small-vessel change of the thalami and basal ganglia. Moderate chronic small-vessel changes of the cerebral hemispheric white matter. No large vessel territory infarction. No evidence of mass lesion, hemorrhage, obstructive hydrocephalus or extra-axial collection. Ventricular size is in proportion to the degree of generalized brain atrophy. Vascular: Major vessels at the base of the brain show flow. Skull and upper cervical spine: Negative Sinuses/Orbits: Clear/normal Other: None significant IMPRESSION: No acute or traumatic finding. Brain atrophy. Chronic small-vessel ischemic changes. No evidence of recent thalamic infarction. Electronically Signed   By: Nelson Chimes M.D.   On: 02/11/2017 08:13    Time Spent in minutes  25   Louellen Molder M.D on 02/14/2017 at 2:19 PM  Between 7am to 7pm - Pager - [phone]  After 7pm go to [email or web address] - password [facility]  [facility] -  Office  [phone]

## 2017-02-15 DIAGNOSIS — I2510 Atherosclerotic heart disease of native coronary artery without angina pectoris: Secondary | ICD-10-CM | POA: Diagnosis not present

## 2017-02-15 DIAGNOSIS — S3992XA Unspecified injury of lower back, initial encounter: Secondary | ICD-10-CM | POA: Diagnosis not present

## 2017-02-15 DIAGNOSIS — F3289 Other specified depressive episodes: Secondary | ICD-10-CM | POA: Diagnosis not present

## 2017-02-15 DIAGNOSIS — M549 Dorsalgia, unspecified: Secondary | ICD-10-CM | POA: Diagnosis not present

## 2017-02-15 DIAGNOSIS — J3089 Other allergic rhinitis: Secondary | ICD-10-CM | POA: Diagnosis not present

## 2017-02-15 DIAGNOSIS — M6281 Muscle weakness (generalized): Secondary | ICD-10-CM | POA: Diagnosis not present

## 2017-02-15 DIAGNOSIS — Z9181 History of falling: Secondary | ICD-10-CM | POA: Diagnosis not present

## 2017-02-15 DIAGNOSIS — E612 Magnesium deficiency: Secondary | ICD-10-CM | POA: Diagnosis not present

## 2017-02-15 DIAGNOSIS — F5101 Primary insomnia: Secondary | ICD-10-CM | POA: Diagnosis not present

## 2017-02-15 DIAGNOSIS — D510 Vitamin B12 deficiency anemia due to intrinsic factor deficiency: Secondary | ICD-10-CM | POA: Diagnosis not present

## 2017-02-15 DIAGNOSIS — S32029A Unspecified fracture of second lumbar vertebra, initial encounter for closed fracture: Secondary | ICD-10-CM | POA: Diagnosis not present

## 2017-02-15 DIAGNOSIS — S22029D Unspecified fracture of second thoracic vertebra, subsequent encounter for fracture with routine healing: Secondary | ICD-10-CM | POA: Diagnosis not present

## 2017-02-15 DIAGNOSIS — F0151 Vascular dementia with behavioral disturbance: Secondary | ICD-10-CM | POA: Diagnosis not present

## 2017-02-15 DIAGNOSIS — I10 Essential (primary) hypertension: Secondary | ICD-10-CM | POA: Diagnosis not present

## 2017-02-15 DIAGNOSIS — G8921 Chronic pain due to trauma: Secondary | ICD-10-CM | POA: Diagnosis not present

## 2017-02-15 DIAGNOSIS — K5900 Constipation, unspecified: Secondary | ICD-10-CM | POA: Diagnosis not present

## 2017-02-15 DIAGNOSIS — S32029D Unspecified fracture of second lumbar vertebra, subsequent encounter for fracture with routine healing: Secondary | ICD-10-CM | POA: Diagnosis not present

## 2017-02-15 DIAGNOSIS — F0150 Vascular dementia without behavioral disturbance: Secondary | ICD-10-CM | POA: Diagnosis not present

## 2017-02-15 DIAGNOSIS — F418 Other specified anxiety disorders: Secondary | ICD-10-CM | POA: Diagnosis not present

## 2017-02-15 DIAGNOSIS — R2681 Unsteadiness on feet: Secondary | ICD-10-CM | POA: Diagnosis not present

## 2017-02-15 DIAGNOSIS — F0280 Dementia in other diseases classified elsewhere without behavioral disturbance: Secondary | ICD-10-CM | POA: Diagnosis not present

## 2017-02-15 DIAGNOSIS — R278 Other lack of coordination: Secondary | ICD-10-CM | POA: Diagnosis not present

## 2017-02-15 DIAGNOSIS — S32020D Wedge compression fracture of second lumbar vertebra, subsequent encounter for fracture with routine healing: Secondary | ICD-10-CM | POA: Diagnosis not present

## 2017-02-15 DIAGNOSIS — H3530 Unspecified macular degeneration: Secondary | ICD-10-CM | POA: Diagnosis not present

## 2017-02-15 NOTE — Plan of Care (Signed)
Problem: Education: Goal: Knowledge of [facility] Education information/materials will improve Outcome: Progressing Spoke with pt. Son and updated on the plan care. Pt will discharged to rehab facility.  Problem: Education: Goal: Knowledge of disease or condition will improve Outcome: Progressing Son educated over the phone.

## 2017-02-15 NOTE — Care Management Important Message (Signed)
Important Message  Patient Details  Name: Pamela Callahan MRN: [id number] Date of Birth: [birth year]   Medicare Important Message Given:  Yes    Kerin Salen 02/15/2017, 11:15 AMImportant Message  Patient Details  Name: Pamela Callahan MRN: [id number] Date of Birth: 07-19-1929   Medicare Important Message Given:  Yes    Kerin Salen 02/15/2017, 11:15 AM

## 2017-02-15 NOTE — Care Management Note (Signed)
Case Management Note  Patient Details  Name: Pamela Callahan MRN: [id number] Date of Birth: May 15, 1929  Subjective/Objective:                    Action/Plan:dc SNF.   Expected Discharge Date:  02/15/17               Expected Discharge Plan:  Skilled Nursing Facility  In-House Referral:  Clinical Social Work  Discharge planning Services  CM Consult  Post Acute Care Choice:    Choice offered to:     DME Arranged:    DME Agency:     HH Arranged:    [place] Agency:     Status of Service:  Completed, signed off  If discussed at [employer], dates discussed:    Additional Comments:  Dessa Phi, RN 02/15/2017, 12:15 PM

## 2017-02-15 NOTE — Discharge Summary (Signed)
Physician Discharge Summary  Pamela Callahan ZOX:[id number] DOB: 12/28/1928 DOA: 02/10/2017  PCP: Cassandria Anger, MD  Admit date: 02/10/2017 Discharge date: 02/15/2017  Admitted From: [facility], memory care Disposition: Skilled nursing facility  Recommendations for Outpatient Follow-up:  1. Follow up with M.D. at SNF in 1 week 2. Follow up with neurosurgery Dr Ronnald Ramp in 2 weeks  Equipment/Devices: lumbar TLSO brace  Discharge Condition: FAIR CODE STATUS: DNR Diet recommendation: regular    Discharge Diagnoses:  Principal Problem:   Fall   Active Problems:   Essential hypertension   RBBB (right bundle branch block)   B12 deficiency   Depression with anxiety   Closed L2 vertebral fracture (HCC)   Traumatic epidural hematoma   Severe dementia  Brief narrative 83 year old severely demented female with hypertension, hyperlipidemia, anxiety, depression and GERD presented with fall at the memory care unit and complained of back pain. Reportedly she fell while she was working to fix piece of furniture. She complained of pain in her back. No reported loss of consciousness or head injury. No fever, chills, dizziness, chest pain, shortness of breath, abdominal pain, nausea, vomiting, bowel or urinary symptoms. In the ED vitals were stable. CT scan of the lumbar spine showed L2 acute fracture with 50% loss of height and anterior epidural soft tissue thickening suggestive of epidural hematoma causing moderate canal stenosis. CT of the head and cervical spine showed a new lacunar infarct in the right thalamus. Patient admitted to hospitalist service, neurology and neurosurgery consulted.  Hospital course  Principal Problem:  Fall with acute L2 compression fracture - associated retropulsion and moderate canal stenosis. Imaging reviewed by neurosurgeon and considered not to be a surgical candidate. Recommended applying TLSO brace mobilizing her on it. Recommended that she does not  need the brace while lying down and can be applied when she is sitting and moving around. She should follow-up with him in 2 weeks after discharge. -upright xray of lumbar spine done with brace applied ,shows stable L2 fracture. Agent has tolerated the brace while in the hospital. PT recommends [facility] at memory care but since we are not able to provide therapy patient would be discharged to SNF.  Pain control with when necessary Percocet.  Active Problems:  Abnormal head CT Head CT shows possible acute lacunar infarct. [facility] Kitchen MRI brain negative for acute stroke. No need for further stroke workup. Neurology signed off.    Essential hypertension Stable. Continue ARB.  Severe dementia Continue Namenda and Aricept  Anxiety and depression Stable. Continue home medications.     Family Communication  :  no family at bedside today. Discussed with daughter let patient in the hospital.  Disposition Plan  :  SNF    Consults  :   Neurology Neurosurgery (Dr. Ronnald Ramp)  Procedures  :  CT head, CT angiogram of the head and neck CT lumbar spine MRI brain  Discharge Instructions   Allergies as of 02/15/2017      Reactions   Latex Rash   Ace Inhibitors    REACTION: cough   Penicillins    Yellow Dye    Other reaction(s): Other (See Comments)      Medication List    STOP taking these medications   triamcinolone cream 0.5 % Commonly known as:  KENALOG     TAKE these medications   aspirin 81 MG tablet Take 81 mg by mouth daily.   clorazepate 3.75 MG tablet Commonly known as:  TRANXENE Take 3.75 mg by mouth 2 (  two) times daily as needed for anxiety. What changed:  Another medication with the same name was removed. Continue taking this medication, and follow the directions you see here.   divalproex 250 MG DR tablet Commonly known as:  DEPAKOTE Take 250 mg by mouth 2 (two) times daily.   donepezil 10 MG tablet Commonly known as:  ARICEPT Take 1 tablet (10 mg total) by  mouth at bedtime. What changed:  how much to take   fexofenadine 180 MG tablet Commonly known as:  ALLEGRA Take 1 tablet (180 mg total) by mouth daily.   fluticasone 50 MCG/ACT nasal spray Commonly known as:  FLONASE Place 1 spray into both nostrils daily. As needed   LORazepam 1 MG tablet Commonly known as:  ATIVAN Take 1 tablet (1 mg total) by mouth 3 (three) times daily.   Magnesium Gluconate 550 MG Tabs Take 1 tablet by mouth daily.   memantine 10 MG tablet Commonly known as:  NAMENDA Take 10 mg by mouth daily.   olmesartan 20 MG tablet Commonly known as:  BENICAR Take 1 tablet (20 mg total) by mouth daily.   oxyCODONE-acetaminophen 5-325 MG tablet Commonly known as:  PERCOCET/ROXICET Take 1 tablet by mouth every 6 (six) hours as needed for moderate pain.   PARoxetine 20 MG tablet Commonly known as:  PAXIL Take 20 mg by mouth daily. What changed:  Another medication with the same name was removed. Continue taking this medication, and follow the directions you see here.   polyethylene glycol packet Commonly known as:  MIRALAX / GLYCOLAX Take 17 g by mouth daily as needed for mild constipation.   PRESERVISION/LUTEIN Caps Take by mouth 2 (two) times daily.   vitamin B-12 1000 MCG tablet Commonly known as:  CYANOCOBALAMIN Take 1 tablet (1,000 mcg total) by mouth daily.       Contact information for follow-up providers    MD at memory care Follow up in 1 week(s).        [name], MD. Schedule an appointment as soon as possible for a visit in 2 week(s).   Specialty:  Neurosurgery Contact information: [address] [place] [address] [phone]            Contact information for after-discharge care    Destination    [facility] .   Specialty:  [place] information: [address] [phone]                 Allergies  Allergen Reactions  .  Latex Rash  . Ace Inhibitors     REACTION: cough  . Penicillins   . Yellow Dye     Other reaction(s): Other (See Comments)     Procedures/Studies: Ct Angio Head W Or Wo Contrast  Result Date: 02/10/2017 CLINICAL DATA:  Initial evaluation for bone witnessed fall. Concern for lacunar infarct. EXAM: CT ANGIOGRAPHY HEAD AND NECK TECHNIQUE: Multidetector CT imaging of the head and neck was performed using the standard protocol during bolus administration of intravenous contrast. Multiplanar CT image reconstructions and MIPs were obtained to evaluate the vascular anatomy. Carotid stenosis measurements (when applicable) are obtained utilizing NASCET criteria, using the distal internal carotid diameter as the denominator. CONTRAST:  100 cc of Isovue 370. COMPARISON:  Prior CT from earlier the same day. FINDINGS: CTA NECK FINDINGS Aortic arch: Visualized aortic arch of normal caliber with normal branch pattern. Scattered atheromatous plaque present within the aortic arch  and about the origin the great vessels without flow-limiting stenosis. Visualized subclavian artery is are widely patent. Right carotid system: Right common carotid artery tortuous proximally but widely patent to the bifurcation. Mild atheromatous plaque about the proximal right ICA without flow-limiting stenosis. Right ICA tortuous but patent to the skullbase without stenosis, dissection, or occlusion. Left carotid system: Left common carotid artery tortuous proximally but widely patent to the bifurcation. No significant atheromatous narrowing about the left bifurcation/proximal left ICA. Left ICA patent distally to the skullbase without stenosis, dissection, or occlusion. Vertebral arteries: Both of the vertebral arteries arise from the subclavian arteries. Vertebral artery is tortuous proximally. Right vertebral artery dominant. Minimal focal irregularity at the bilateral V3 segments favored to be chronic and reflect short-segment mild FMD.  Vertebral arteries patent within the neck without stenosis, dissection, or occlusion. Skeleton: No acute osseous abnormality. No worrisome lytic or blastic osseous lesions. Other neck: Soft tissues of the neck demonstrate no acute abnormality. Salivary glands normal. No adenopathy. Subcentimeter hypodense right thyroid nodule noted, of doubtful significance. Thyroid otherwise unremarkable. Upper chest: Visualized upper chest within normal limits. Partially visualized lungs are clear. Review of the MIP images confirms the above findings CTA HEAD FINDINGS Anterior circulation: The petrous, cavernous, and supraclinoid segments widely patent without flow-limiting stenosis. Minimal atherosclerotic change for age within the carotid siphons. ICA termini widely patent. A1 segments patent bilaterally. Anterior communicating artery normal. Anterior cerebral artery is widely patent to their distal aspects. M1 segments patent without stenosis or occlusion. MCA bifurcations normal. No proximal M2 occlusion. Distal MCA branches well opacified and symmetric. Posterior circulation: Vertebral artery is widely patent to the vertebrobasilar junction without stenosis. Posterior inferior cerebral arteries patent bilaterally. Basilar artery widely patent. Superior cerebral arteries patent bilaterally. Both of the posterior cerebral arteries primarily supplied via the basilar artery. Right PCA patent to its distal aspect without stenosis. There is a short-segment moderate to severe proximal left P2 stenosis (series 9, image 96). Left PCA and widely patent distally. Venous sinuses: Patent. Anatomic variants: No significant anatomic variant. No aneurysm or vascular malformation. Delayed phase: No pathologic enhancement. Review of the MIP images confirms the above findings IMPRESSION: 1. Negative CTA of the head and neck. No acute large vessel or proximal arterial branch occlusion. 2. Single short-segment moderate to severe proximal left P2  stenosis. No other high-grade or correctable stenosis identified within the major arterial vasculature of the head and neck. Relatively mild atheromatous disease for patient age. 3. Short-segment mild irregularity involving the V3 segments bilaterally, suspected to be related to mild FMD. 4. Diffuse tortuosity of the major arterial vasculature, suggesting chronic underlying hypertension. Electronically Signed   By: Jeannine Boga M.D.   On: 02/10/2017 23:57   Dg Chest 2 View  Result Date: 02/10/2017 CLINICAL DATA:  83 y/o F; altered mental status, chest pain, shortness of breath. EXAM: CHEST  2 VIEW COMPARISON:  04/02/2016 chest radiograph FINDINGS: Normal cardiac silhouette. Aortic atherosclerosis with calcification. Clear lungs. No pleural effusion or pneumothorax. Bones are unremarkable. IMPRESSION: No acute pulmonary process identified. Electronically Signed   By: Kristine Garbe M.D.   On: 02/10/2017 19:34   Dg Lumbar Spine Complete  Result Date: 02/12/2017 CLINICAL DATA:  Followup L2 vertebral body fracture. EXAM: LUMBAR SPINE - COMPLETE 4+ VIEW COMPARISON:  Lumbar spine CT dated 02/10/2018. FINDINGS: Five non-rib-bearing lumbar vertebrae. Diffuse osteopenia. Stable 50% L2 superior endplate compression fracture with previously described bony retropulsion. No acute fracture and no subluxation. Atheromatous arterial calcifications. Bilateral hip  prostheses. Cholecystectomy clips. IMPRESSION: Stable L2 compression fracture. Electronically Signed   By: Claudie Revering M.D.   On: 02/12/2017 10:49   Ct Head Wo Contrast  Result Date: 02/10/2017 CLINICAL DATA:  Pain after fall EXAM: CT HEAD WITHOUT CONTRAST CT CERVICAL SPINE WITHOUT CONTRAST TECHNIQUE: Multidetector CT imaging of the head and cervical spine was performed following the standard protocol without intravenous contrast. Multiplanar CT image reconstructions of the cervical spine were also generated. COMPARISON:  April 02, 2016  FINDINGS: CT HEAD FINDINGS Brain: No subdural, epidural, or subarachnoid hemorrhage. No mass effect or midline shift. Ventricles and sulci are prominent stable. Cerebellum, brainstem, and basal cisterns are normal. White matter changes are identified. There is a lacunar infarct in the left caudate, present previously. It is slightly more prominent today, probably due to difference in slice selection. There is a lacunar infarct in the right thalamus is more prominent today also, possibly due to difference in slice selection. No acute cortical ischemia or infarct identified. Vascular: No hyperdense vessel or unexpected calcification. Skull: Normal. Negative for fracture or focal lesion. Sinuses/Orbits: No acute finding. Other: None. CT CERVICAL SPINE FINDINGS Alignment: There is 2 mm of anterolisthesis of C7 versus T1 identified. Minimal anterolisthesis of T2 versus T3 identified. No other malalignment. Skull base and vertebrae: No acute fracture. No primary bone lesion or focal pathologic process. Soft tissues and spinal canal: No prevertebral fluid or swelling. No visible canal hematoma. Disc levels:  Multilevel degenerative disc disease identified. Upper chest: Negative. Other: No other abnormalities. IMPRESSION: 1. No acute traumatic intracranial abnormality. 2. There is a lacunar infarct in the right thalamus which was not appreciated on the previous study. It is possible the difference could be due to slice selection. An MRI could better assess for subtle acute infarcts. No other definitive acute abnormalities seen on today's study. 3. No fracture or traumatic malalignment in the cervical spine. Degenerative changes. Electronically Signed   By: Dorise Bullion III M.D   On: 02/10/2017 18:31   Ct Angio Neck W And/or Wo Contrast  Result Date: 02/10/2017 CLINICAL DATA:  Initial evaluation for bone witnessed fall. Concern for lacunar infarct. EXAM: CT ANGIOGRAPHY HEAD AND NECK TECHNIQUE: Multidetector CT imaging  of the head and neck was performed using the standard protocol during bolus administration of intravenous contrast. Multiplanar CT image reconstructions and MIPs were obtained to evaluate the vascular anatomy. Carotid stenosis measurements (when applicable) are obtained utilizing NASCET criteria, using the distal internal carotid diameter as the denominator. CONTRAST:  100 cc of Isovue 370. COMPARISON:  Prior CT from earlier the same day. FINDINGS: CTA NECK FINDINGS Aortic arch: Visualized aortic arch of normal caliber with normal branch pattern. Scattered atheromatous plaque present within the aortic arch and about the origin the great vessels without flow-limiting stenosis. Visualized subclavian artery is are widely patent. Right carotid system: Right common carotid artery tortuous proximally but widely patent to the bifurcation. Mild atheromatous plaque about the proximal right ICA without flow-limiting stenosis. Right ICA tortuous but patent to the skullbase without stenosis, dissection, or occlusion. Left carotid system: Left common carotid artery tortuous proximally but widely patent to the bifurcation. No significant atheromatous narrowing about the left bifurcation/proximal left ICA. Left ICA patent distally to the skullbase without stenosis, dissection, or occlusion. Vertebral arteries: Both of the vertebral arteries arise from the subclavian arteries. Vertebral artery is tortuous proximally. Right vertebral artery dominant. Minimal focal irregularity at the bilateral V3 segments favored to be chronic and reflect short-segment mild FMD.  Vertebral arteries patent within the neck without stenosis, dissection, or occlusion. Skeleton: No acute osseous abnormality. No worrisome lytic or blastic osseous lesions. Other neck: Soft tissues of the neck demonstrate no acute abnormality. Salivary glands normal. No adenopathy. Subcentimeter hypodense right thyroid nodule noted, of doubtful significance. Thyroid  otherwise unremarkable. Upper chest: Visualized upper chest within normal limits. Partially visualized lungs are clear. Review of the MIP images confirms the above findings CTA HEAD FINDINGS Anterior circulation: The petrous, cavernous, and supraclinoid segments widely patent without flow-limiting stenosis. Minimal atherosclerotic change for age within the carotid siphons. ICA termini widely patent. A1 segments patent bilaterally. Anterior communicating artery normal. Anterior cerebral artery is widely patent to their distal aspects. M1 segments patent without stenosis or occlusion. MCA bifurcations normal. No proximal M2 occlusion. Distal MCA branches well opacified and symmetric. Posterior circulation: Vertebral artery is widely patent to the vertebrobasilar junction without stenosis. Posterior inferior cerebral arteries patent bilaterally. Basilar artery widely patent. Superior cerebral arteries patent bilaterally. Both of the posterior cerebral arteries primarily supplied via the basilar artery. Right PCA patent to its distal aspect without stenosis. There is a short-segment moderate to severe proximal left P2 stenosis (series 9, image 96). Left PCA and widely patent distally. Venous sinuses: Patent. Anatomic variants: No significant anatomic variant. No aneurysm or vascular malformation. Delayed phase: No pathologic enhancement. Review of the MIP images confirms the above findings IMPRESSION: 1. Negative CTA of the head and neck. No acute large vessel or proximal arterial branch occlusion. 2. Single short-segment moderate to severe proximal left P2 stenosis. No other high-grade or correctable stenosis identified within the major arterial vasculature of the head and neck. Relatively mild atheromatous disease for patient age. 3. Short-segment mild irregularity involving the V3 segments bilaterally, suspected to be related to mild FMD. 4. Diffuse tortuosity of the major arterial vasculature, suggesting chronic  underlying hypertension. Electronically Signed   By: Jeannine Boga M.D.   On: 02/10/2017 23:57   Ct Cervical Spine Wo Contrast  Result Date: 02/10/2017 CLINICAL DATA:  Pain after fall EXAM: CT HEAD WITHOUT CONTRAST CT CERVICAL SPINE WITHOUT CONTRAST TECHNIQUE: Multidetector CT imaging of the head and cervical spine was performed following the standard protocol without intravenous contrast. Multiplanar CT image reconstructions of the cervical spine were also generated. COMPARISON:  April 02, 2016 FINDINGS: CT HEAD FINDINGS Brain: No subdural, epidural, or subarachnoid hemorrhage. No mass effect or midline shift. Ventricles and sulci are prominent stable. Cerebellum, brainstem, and basal cisterns are normal. White matter changes are identified. There is a lacunar infarct in the left caudate, present previously. It is slightly more prominent today, probably due to difference in slice selection. There is a lacunar infarct in the right thalamus is more prominent today also, possibly due to difference in slice selection. No acute cortical ischemia or infarct identified. Vascular: No hyperdense vessel or unexpected calcification. Skull: Normal. Negative for fracture or focal lesion. Sinuses/Orbits: No acute finding. Other: None. CT CERVICAL SPINE FINDINGS Alignment: There is 2 mm of anterolisthesis of C7 versus T1 identified. Minimal anterolisthesis of T2 versus T3 identified. No other malalignment. Skull base and vertebrae: No acute fracture. No primary bone lesion or focal pathologic process. Soft tissues and spinal canal: No prevertebral fluid or swelling. No visible canal hematoma. Disc levels:  Multilevel degenerative disc disease identified. Upper chest: Negative. Other: No other abnormalities. IMPRESSION: 1. No acute traumatic intracranial abnormality. 2. There is a lacunar infarct in the right thalamus which was not appreciated on the previous study.  It is possible the difference could be due to slice  selection. An MRI could better assess for subtle acute infarcts. No other definitive acute abnormalities seen on today's study. 3. No fracture or traumatic malalignment in the cervical spine. Degenerative changes. Electronically Signed   By: Dorise Bullion III M.D   On: 02/10/2017 18:31   Ct Lumbar Spine Wo Contrast  Result Date: 02/10/2017 CLINICAL DATA:  83 y/o  F; fall with lower back pain. EXAM: CT LUMBAR SPINE WITHOUT CONTRAST TECHNIQUE: Multidetector CT imaging of the lumbar spine was performed without intravenous contrast administration. Multiplanar CT image reconstructions were also generated. COMPARISON:  None. FINDINGS: Segmentation: 5 lumbar type vertebrae. Alignment: Normal. Vertebrae: L2 acute superior endplate fracture with moderate 50% loss of height and 7 mm retropulsion of the superior endplate. 8 mm anterior epidural soft tissue thickening at L2 level, probably a small epidural hematoma, with resultant moderate canal stenosis. No other acute fracture identified. Paraspinal and other soft tissues: Aortic and iliofemoral calcific atherosclerosis. Disc levels: Mild L3-4, moderate to severe L4-5, and moderate L5-S1 disc space narrowing. L2 through L5 spinous process articulation may represent Baastrup's disease. L2 through L5 small disc bulges. No high-grade bony canal stenosis or foraminal narrowing. IMPRESSION: L2 acute superior endplate fracture with 31% loss of height and 7 mm retropulsion of superior endplate. 8 mm anterior epidural soft tissue thickening at L2 level, probably a small epidural hematoma, with resultant moderate canal stenosis. These results were called by telephone at the time of interpretation on 02/10/2017 at 8:53 pm to Dr. Eliezer Mccoy , who verbally acknowledged these results. Electronically Signed   By: Kristine Garbe M.D.   On: 02/10/2017 20:56   Mr Brain Wo Contrast  Result Date: 02/11/2017 CLINICAL DATA:  Fall and mental status changes. Question age of  right thalamic abnormality. EXAM: MRI HEAD WITHOUT CONTRAST TECHNIQUE: Multiplanar, multiecho pulse sequences of the brain and surrounding structures were obtained without intravenous contrast. COMPARISON:  CT examinations done yesterday and 04/02/2016. FINDINGS: Brain: Generalized brain atrophy. Diffusion imaging does not show any acute or subacute infarction. Mild chronic small-vessel changes of the pons. No focal cerebellar insult. Minimal chronic small-vessel change of the thalami and basal ganglia. Moderate chronic small-vessel changes of the cerebral hemispheric white matter. No large vessel territory infarction. No evidence of mass lesion, hemorrhage, obstructive hydrocephalus or extra-axial collection. Ventricular size is in proportion to the degree of generalized brain atrophy. Vascular: Major vessels at the base of the brain show flow. Skull and upper cervical spine: Negative Sinuses/Orbits: Clear/normal Other: None significant IMPRESSION: No acute or traumatic finding. Brain atrophy. Chronic small-vessel ischemic changes. No evidence of recent thalamic infarction. Electronically Signed   By: Nelson Chimes M.D.   On: 02/11/2017 08:13      Subjective: Pain better. Lumbar TLSO brace applied and tolerating well.   Discharge Exam: Vitals:   02/14/17 2125 02/15/17 0532  BP: (!) 126/97 (!) 156/60  Pulse: 65 (!) 56  Resp: 18 18  Temp: 98.8 F (37.1 C) 98.1 F (36.7 C)   Vitals:   02/14/17 0441 02/14/17 1349 02/14/17 2125 02/15/17 0532  BP: (!) 143/62 139/61 (!) 126/97 (!) 156/60  Pulse: 60 65 65 (!) 56  Resp: 17 16 18 18   Temp: 97.8 F (36.6 C) 98.6 F (37 C) 98.8 F (37.1 C) 98.1 F (36.7 C)  TempSrc: Oral Oral Oral Oral  SpO2: 98% 97% 96% 97%  Weight:      Height:  Gen: Elderly female, confused, not in distress HEENT:  moist mucosa, supple neck Chest: clear b/l, no added sounds CVS: N S1&S2, no murmurs,  GI: soft, NT, ND Musculoskeletal: warm, no edema, Low back  tenderness CNS: AAOX0, non focal    The results of significant diagnostics from this hospitalization (including imaging, microbiology, ancillary and laboratory) are listed below for reference.     Microbiology: Recent Results (from the past 240 hour(s))  MRSA PCR Screening     Status: None   Collection Time: 02/11/17  2:51 AM  Result Value Ref Range Status   MRSA by PCR NEGATIVE NEGATIVE Final    Comment:        The GeneXpert MRSA Assay (FDA approved for NASAL specimens only), is one component of a comprehensive MRSA colonization surveillance program. It is not intended to diagnose MRSA infection nor to guide or monitor treatment for MRSA infections.      Labs: BNP (last 3 results) No results for input(s): BNP in the last 8760 hours. Basic Metabolic Panel:  Recent Labs Lab 02/10/17 1827 02/10/17 1927  NA 133* 133*  K 4.1 4.0  CL 97* 94*  CO2 27  --   GLUCOSE 97 92  BUN 16 14  CREATININE 0.69 0.70  CALCIUM 8.6*  --    Liver Function Tests: No results for input(s): AST, ALT, ALKPHOS, BILITOT, PROT, ALBUMIN in the last 168 hours. No results for input(s): LIPASE, AMYLASE in the last 168 hours. No results for input(s): AMMONIA in the last 168 hours. CBC:  Recent Labs Lab 02/10/17 1827 02/10/17 1927  WBC 8.6  --   NEUTROABS 6.9  --   HGB 11.8* 12.2  HCT 34.5* 36.0  MCV 89.8  --   PLT 163  --    Cardiac Enzymes: No results for input(s): CKTOTAL, CKMB, CKMBINDEX, TROPONINI in the last 168 hours. BNP: Invalid input(s): POCBNP CBG: No results for input(s): GLUCAP in the last 168 hours. D-Dimer No results for input(s): DDIMER in the last 72 hours. Hgb A1c No results for input(s): HGBA1C in the last 72 hours. Lipid Profile No results for input(s): CHOL, HDL, LDLCALC, TRIG, CHOLHDL, LDLDIRECT in the last 72 hours. Thyroid function studies No results for input(s): TSH, T4TOTAL, T3FREE, THYROIDAB in the last 72 hours.  Invalid input(s): FREET3 Anemia work  up No results for input(s): VITAMINB12, FOLATE, FERRITIN, TIBC, IRON, RETICCTPCT in the last 72 hours. Urinalysis    Component Value Date/Time   COLORURINE YELLOW 02/10/2017 1735   APPEARANCEUR CLEAR 02/10/2017 1735   LABSPEC 1.010 02/10/2017 1735   PHURINE 8.0 02/10/2017 1735   GLUCOSEU NEGATIVE 02/10/2017 1735   GLUCOSEU NEGATIVE 07/01/2015 1647   HGBUR NEGATIVE 02/10/2017 1735   BILIRUBINUR NEGATIVE 02/10/2017 1735   KETONESUR 5 (A) 02/10/2017 1735   PROTEINUR NEGATIVE 02/10/2017 1735   UROBILINOGEN 0.2 07/01/2015 1647   NITRITE NEGATIVE 02/10/2017 1735   LEUKOCYTESUR NEGATIVE 02/10/2017 1735   Sepsis Labs Invalid input(s): PROCALCITONIN,  WBC,  LACTICIDVEN Microbiology Recent Results (from the past 240 hour(s))  MRSA PCR Screening     Status: None   Collection Time: 02/11/17  2:51 AM  Result Value Ref Range Status   MRSA by PCR NEGATIVE NEGATIVE Final    Comment:        The GeneXpert MRSA Assay (FDA approved for NASAL specimens only), is one component of a comprehensive MRSA colonization surveillance program. It is not intended to diagnose MRSA infection nor to guide or monitor treatment for MRSA infections.  Time coordinating discharge: <30 minutes  SIGNED:   Louellen Molder, MD  [facility] 02/15/2017, 10:55 AM Pager   If 7PM-7AM, please contact night-coverage [email or web address] Password [email or web address]

## 2017-02-15 NOTE — Clinical Social Work Placement (Signed)
Patient received and accepted bed offer at [facility]. [facility] contacted, family aware. Patient's RN can call report to [phone].  CLINICAL SOCIAL WORK PLACEMENT  NOTE  Date:  02/15/2017  Patient Details  Name: Pamela Callahan MRN: [id number] Date of Birth: [birth year]  Clinical Social Work is seeking post-discharge placement for this patient at the [facility] level of care (*CSW will initial, date and re-position this form in  chart as items are completed):  Yes   Patient/family provided with [facility] Work Department's list of facilities offering this level of care within the geographic area requested by the patient (or if unable, by the patient's family).  Yes   Patient/family informed of their freedom to choose among providers that offer the needed level of care, that participate in Medicare, Medicaid or managed care program needed by the patient, have an available bed and are willing to accept the patient.  Yes   Patient/family informed of [facility]'s ownership interest in [facility] and [facility], as well as of the fact that they are under no obligation to receive care at these facilities.  PASRR submitted to EDS on       PASRR number received on 02/14/17     Existing PASRR number confirmed on       FL2 transmitted to all facilities in geographic area requested by pt/family on 02/12/17     FL2 transmitted to all facilities within larger geographic area on       Patient informed that his/her managed care company has contracts with or will negotiate with certain facilities, including the following:        Yes   Patient/family informed of bed offers received.  Patient chooses bed at [facility]     Physician recommends and patient chooses bed at      Patient to be transferred to [facility] on 02/15/17.  Patient to be transferred to facility by [facility]     Patient family notified on 02/15/17 of transfer.  Name of family member  notified:  Tera Partridge     PHYSICIAN       Additional Comment:    _______________________________________________ Burnis Medin, LCSW 02/15/2017, 2:30 PM

## 2017-02-16 NOTE — Progress Notes (Signed)
Report called to [place] at [facility]. Pt VSS and transported [facility]. No noted s/s of distress or pain upon discharge.

## 2017-02-17 ENCOUNTER — Telehealth: Payer: Self-pay | Admitting: *Deleted

## 2017-02-17 NOTE — Telephone Encounter (Signed)
Pt was on TCM list admitted 7/12, for Fall. Pt was D/C 7/17, and sent to SNF. She will  Follow up with M.D. at SNF in 1 week...Johny Chess

## 2017-02-28 DIAGNOSIS — S32029A Unspecified fracture of second lumbar vertebra, initial encounter for closed fracture: Secondary | ICD-10-CM | POA: Diagnosis not present

## 2017-02-28 DIAGNOSIS — I10 Essential (primary) hypertension: Secondary | ICD-10-CM | POA: Diagnosis not present

## 2017-03-10 DIAGNOSIS — F418 Other specified anxiety disorders: Secondary | ICD-10-CM | POA: Diagnosis not present

## 2017-03-10 DIAGNOSIS — S32029D Unspecified fracture of second lumbar vertebra, subsequent encounter for fracture with routine healing: Secondary | ICD-10-CM | POA: Diagnosis not present

## 2017-03-10 DIAGNOSIS — F0280 Dementia in other diseases classified elsewhere without behavioral disturbance: Secondary | ICD-10-CM | POA: Diagnosis not present

## 2017-03-10 DIAGNOSIS — G8921 Chronic pain due to trauma: Secondary | ICD-10-CM | POA: Diagnosis not present

## 2017-03-15 DIAGNOSIS — S32009A Unspecified fracture of unspecified lumbar vertebra, initial encounter for closed fracture: Secondary | ICD-10-CM | POA: Diagnosis not present

## 2017-03-15 DIAGNOSIS — F419 Anxiety disorder, unspecified: Secondary | ICD-10-CM | POA: Diagnosis not present

## 2017-03-15 DIAGNOSIS — J302 Other seasonal allergic rhinitis: Secondary | ICD-10-CM | POA: Diagnosis not present

## 2017-03-15 DIAGNOSIS — I10 Essential (primary) hypertension: Secondary | ICD-10-CM | POA: Diagnosis not present

## 2017-03-15 DIAGNOSIS — F339 Major depressive disorder, recurrent, unspecified: Secondary | ICD-10-CM | POA: Diagnosis not present

## 2017-03-15 DIAGNOSIS — F0390 Unspecified dementia without behavioral disturbance: Secondary | ICD-10-CM | POA: Diagnosis not present

## 2017-03-20 DIAGNOSIS — F0280 Dementia in other diseases classified elsewhere without behavioral disturbance: Secondary | ICD-10-CM | POA: Diagnosis not present

## 2017-03-20 DIAGNOSIS — S064X1A Epidural hemorrhage with loss of consciousness of 30 minutes or less, initial encounter: Secondary | ICD-10-CM | POA: Diagnosis not present

## 2017-03-20 DIAGNOSIS — Z91040 Latex allergy status: Secondary | ICD-10-CM | POA: Diagnosis not present

## 2017-03-20 DIAGNOSIS — S199XXA Unspecified injury of neck, initial encounter: Secondary | ICD-10-CM | POA: Diagnosis not present

## 2017-03-20 DIAGNOSIS — Z79899 Other long term (current) drug therapy: Secondary | ICD-10-CM | POA: Diagnosis not present

## 2017-03-20 DIAGNOSIS — Z888 Allergy status to other drugs, medicaments and biological substances status: Secondary | ICD-10-CM | POA: Diagnosis not present

## 2017-03-20 DIAGNOSIS — S0003XA Contusion of scalp, initial encounter: Secondary | ICD-10-CM | POA: Diagnosis not present

## 2017-03-20 DIAGNOSIS — Z880 Allergy status to penicillin: Secondary | ICD-10-CM | POA: Diagnosis not present

## 2017-03-20 DIAGNOSIS — G319 Degenerative disease of nervous system, unspecified: Secondary | ICD-10-CM | POA: Diagnosis not present

## 2017-03-20 DIAGNOSIS — Z7982 Long term (current) use of aspirin: Secondary | ICD-10-CM | POA: Diagnosis not present

## 2017-03-20 DIAGNOSIS — S0990XA Unspecified injury of head, initial encounter: Secondary | ICD-10-CM | POA: Diagnosis not present

## 2017-03-20 DIAGNOSIS — Z043 Encounter for examination and observation following other accident: Secondary | ICD-10-CM | POA: Diagnosis not present

## 2017-03-21 DIAGNOSIS — M2040 Other hammer toe(s) (acquired), unspecified foot: Secondary | ICD-10-CM | POA: Diagnosis not present

## 2017-03-21 DIAGNOSIS — E1340 Other specified diabetes mellitus with diabetic neuropathy, unspecified: Secondary | ICD-10-CM | POA: Diagnosis not present

## 2017-03-21 DIAGNOSIS — M2010 Hallux valgus (acquired), unspecified foot: Secondary | ICD-10-CM | POA: Diagnosis not present

## 2017-03-21 DIAGNOSIS — B351 Tinea unguium: Secondary | ICD-10-CM | POA: Diagnosis not present

## 2017-03-22 DIAGNOSIS — F419 Anxiety disorder, unspecified: Secondary | ICD-10-CM | POA: Diagnosis not present

## 2017-03-22 DIAGNOSIS — J302 Other seasonal allergic rhinitis: Secondary | ICD-10-CM | POA: Diagnosis not present

## 2017-03-22 DIAGNOSIS — F0390 Unspecified dementia without behavioral disturbance: Secondary | ICD-10-CM | POA: Diagnosis not present

## 2017-03-22 DIAGNOSIS — S32009A Unspecified fracture of unspecified lumbar vertebra, initial encounter for closed fracture: Secondary | ICD-10-CM | POA: Diagnosis not present

## 2017-03-22 DIAGNOSIS — I10 Essential (primary) hypertension: Secondary | ICD-10-CM | POA: Diagnosis not present

## 2017-03-22 DIAGNOSIS — L209 Atopic dermatitis, unspecified: Secondary | ICD-10-CM | POA: Diagnosis not present

## 2017-03-22 DIAGNOSIS — S0081XA Abrasion of other part of head, initial encounter: Secondary | ICD-10-CM | POA: Diagnosis not present

## 2017-03-22 DIAGNOSIS — F339 Major depressive disorder, recurrent, unspecified: Secondary | ICD-10-CM | POA: Diagnosis not present

## 2017-03-28 DIAGNOSIS — S32029A Unspecified fracture of second lumbar vertebra, initial encounter for closed fracture: Secondary | ICD-10-CM | POA: Diagnosis not present

## 2017-04-16 DIAGNOSIS — F4320 Adjustment disorder, unspecified: Secondary | ICD-10-CM | POA: Diagnosis not present

## 2017-04-16 DIAGNOSIS — F330 Major depressive disorder, recurrent, mild: Secondary | ICD-10-CM | POA: Diagnosis not present

## 2017-04-25 DIAGNOSIS — I10 Essential (primary) hypertension: Secondary | ICD-10-CM | POA: Diagnosis not present

## 2017-04-25 DIAGNOSIS — S32029A Unspecified fracture of second lumbar vertebra, initial encounter for closed fracture: Secondary | ICD-10-CM | POA: Diagnosis not present

## 2017-05-19 DIAGNOSIS — H43811 Vitreous degeneration, right eye: Secondary | ICD-10-CM | POA: Diagnosis not present

## 2017-05-19 DIAGNOSIS — H353112 Nonexudative age-related macular degeneration, right eye, intermediate dry stage: Secondary | ICD-10-CM | POA: Diagnosis not present

## 2017-05-19 DIAGNOSIS — H353222 Exudative age-related macular degeneration, left eye, with inactive choroidal neovascularization: Secondary | ICD-10-CM | POA: Diagnosis not present

## 2017-05-19 DIAGNOSIS — Z961 Presence of intraocular lens: Secondary | ICD-10-CM | POA: Diagnosis not present

## 2017-05-19 DIAGNOSIS — H26491 Other secondary cataract, right eye: Secondary | ICD-10-CM | POA: Diagnosis not present

## 2017-05-24 DIAGNOSIS — S32009A Unspecified fracture of unspecified lumbar vertebra, initial encounter for closed fracture: Secondary | ICD-10-CM | POA: Diagnosis not present

## 2017-05-24 DIAGNOSIS — R451 Restlessness and agitation: Secondary | ICD-10-CM | POA: Diagnosis not present

## 2017-05-24 DIAGNOSIS — S0031XA Abrasion of nose, initial encounter: Secondary | ICD-10-CM | POA: Diagnosis not present

## 2017-05-24 DIAGNOSIS — S0081XA Abrasion of other part of head, initial encounter: Secondary | ICD-10-CM | POA: Diagnosis not present

## 2017-05-24 DIAGNOSIS — J302 Other seasonal allergic rhinitis: Secondary | ICD-10-CM | POA: Diagnosis not present

## 2017-05-24 DIAGNOSIS — F339 Major depressive disorder, recurrent, unspecified: Secondary | ICD-10-CM | POA: Diagnosis not present

## 2017-05-24 DIAGNOSIS — F419 Anxiety disorder, unspecified: Secondary | ICD-10-CM | POA: Diagnosis not present

## 2017-05-24 DIAGNOSIS — F0390 Unspecified dementia without behavioral disturbance: Secondary | ICD-10-CM | POA: Diagnosis not present

## 2017-05-26 DIAGNOSIS — F0391 Unspecified dementia with behavioral disturbance: Secondary | ICD-10-CM | POA: Diagnosis not present

## 2017-05-28 DIAGNOSIS — Z23 Encounter for immunization: Secondary | ICD-10-CM | POA: Diagnosis not present

## 2017-05-31 DIAGNOSIS — R451 Restlessness and agitation: Secondary | ICD-10-CM | POA: Diagnosis not present

## 2017-05-31 DIAGNOSIS — S90422D Blister (nonthermal), left great toe, subsequent encounter: Secondary | ICD-10-CM | POA: Diagnosis not present

## 2017-05-31 DIAGNOSIS — F3289 Other specified depressive episodes: Secondary | ICD-10-CM | POA: Diagnosis not present

## 2017-05-31 DIAGNOSIS — G308 Other Alzheimer's disease: Secondary | ICD-10-CM | POA: Diagnosis not present

## 2017-05-31 DIAGNOSIS — I10 Essential (primary) hypertension: Secondary | ICD-10-CM | POA: Diagnosis not present

## 2017-05-31 DIAGNOSIS — Z7982 Long term (current) use of aspirin: Secondary | ICD-10-CM | POA: Diagnosis not present

## 2017-05-31 DIAGNOSIS — F418 Other specified anxiety disorders: Secondary | ICD-10-CM | POA: Diagnosis not present

## 2017-05-31 DIAGNOSIS — F0390 Unspecified dementia without behavioral disturbance: Secondary | ICD-10-CM | POA: Diagnosis not present

## 2017-05-31 DIAGNOSIS — M1990 Unspecified osteoarthritis, unspecified site: Secondary | ICD-10-CM | POA: Diagnosis not present

## 2017-05-31 DIAGNOSIS — S90822A Blister (nonthermal), left foot, initial encounter: Secondary | ICD-10-CM | POA: Diagnosis not present

## 2017-05-31 DIAGNOSIS — Z9181 History of falling: Secondary | ICD-10-CM | POA: Diagnosis not present

## 2017-05-31 DIAGNOSIS — S90421A Blister (nonthermal), right great toe, initial encounter: Secondary | ICD-10-CM | POA: Diagnosis not present

## 2017-06-01 DIAGNOSIS — I10 Essential (primary) hypertension: Secondary | ICD-10-CM | POA: Diagnosis not present

## 2017-06-01 DIAGNOSIS — S90422D Blister (nonthermal), left great toe, subsequent encounter: Secondary | ICD-10-CM | POA: Diagnosis not present

## 2017-06-03 DIAGNOSIS — Z7982 Long term (current) use of aspirin: Secondary | ICD-10-CM | POA: Diagnosis not present

## 2017-06-03 DIAGNOSIS — F0390 Unspecified dementia without behavioral disturbance: Secondary | ICD-10-CM | POA: Diagnosis not present

## 2017-06-03 DIAGNOSIS — M1990 Unspecified osteoarthritis, unspecified site: Secondary | ICD-10-CM | POA: Diagnosis not present

## 2017-06-03 DIAGNOSIS — I10 Essential (primary) hypertension: Secondary | ICD-10-CM | POA: Diagnosis not present

## 2017-06-03 DIAGNOSIS — Z9181 History of falling: Secondary | ICD-10-CM | POA: Diagnosis not present

## 2017-06-03 DIAGNOSIS — S90422D Blister (nonthermal), left great toe, subsequent encounter: Secondary | ICD-10-CM | POA: Diagnosis not present

## 2017-06-07 DIAGNOSIS — L0889 Other specified local infections of the skin and subcutaneous tissue: Secondary | ICD-10-CM | POA: Diagnosis not present

## 2017-06-07 DIAGNOSIS — S90422D Blister (nonthermal), left great toe, subsequent encounter: Secondary | ICD-10-CM | POA: Diagnosis not present

## 2017-06-07 DIAGNOSIS — F0390 Unspecified dementia without behavioral disturbance: Secondary | ICD-10-CM | POA: Diagnosis not present

## 2017-06-07 DIAGNOSIS — G9349 Other encephalopathy: Secondary | ICD-10-CM | POA: Diagnosis not present

## 2017-06-07 DIAGNOSIS — I10 Essential (primary) hypertension: Secondary | ICD-10-CM | POA: Diagnosis not present

## 2017-06-07 DIAGNOSIS — Z7982 Long term (current) use of aspirin: Secondary | ICD-10-CM | POA: Diagnosis not present

## 2017-06-07 DIAGNOSIS — F0391 Unspecified dementia with behavioral disturbance: Secondary | ICD-10-CM | POA: Diagnosis not present

## 2017-06-07 DIAGNOSIS — R2689 Other abnormalities of gait and mobility: Secondary | ICD-10-CM | POA: Diagnosis not present

## 2017-06-07 DIAGNOSIS — Z9181 History of falling: Secondary | ICD-10-CM | POA: Diagnosis not present

## 2017-06-07 DIAGNOSIS — R451 Restlessness and agitation: Secondary | ICD-10-CM | POA: Diagnosis not present

## 2017-06-07 DIAGNOSIS — G308 Other Alzheimer's disease: Secondary | ICD-10-CM | POA: Diagnosis not present

## 2017-06-07 DIAGNOSIS — M1990 Unspecified osteoarthritis, unspecified site: Secondary | ICD-10-CM | POA: Diagnosis not present

## 2017-06-09 DIAGNOSIS — M1990 Unspecified osteoarthritis, unspecified site: Secondary | ICD-10-CM | POA: Diagnosis not present

## 2017-06-09 DIAGNOSIS — F0390 Unspecified dementia without behavioral disturbance: Secondary | ICD-10-CM | POA: Diagnosis not present

## 2017-06-09 DIAGNOSIS — S90422D Blister (nonthermal), left great toe, subsequent encounter: Secondary | ICD-10-CM | POA: Diagnosis not present

## 2017-06-09 DIAGNOSIS — Z9181 History of falling: Secondary | ICD-10-CM | POA: Diagnosis not present

## 2017-06-09 DIAGNOSIS — I10 Essential (primary) hypertension: Secondary | ICD-10-CM | POA: Diagnosis not present

## 2017-06-09 DIAGNOSIS — Z7982 Long term (current) use of aspirin: Secondary | ICD-10-CM | POA: Diagnosis not present

## 2017-06-10 DIAGNOSIS — Z79899 Other long term (current) drug therapy: Secondary | ICD-10-CM | POA: Diagnosis not present

## 2017-06-14 DIAGNOSIS — Z7982 Long term (current) use of aspirin: Secondary | ICD-10-CM | POA: Diagnosis not present

## 2017-06-14 DIAGNOSIS — I10 Essential (primary) hypertension: Secondary | ICD-10-CM | POA: Diagnosis not present

## 2017-06-14 DIAGNOSIS — S90422D Blister (nonthermal), left great toe, subsequent encounter: Secondary | ICD-10-CM | POA: Diagnosis not present

## 2017-06-14 DIAGNOSIS — Z9181 History of falling: Secondary | ICD-10-CM | POA: Diagnosis not present

## 2017-06-14 DIAGNOSIS — F0390 Unspecified dementia without behavioral disturbance: Secondary | ICD-10-CM | POA: Diagnosis not present

## 2017-06-14 DIAGNOSIS — M1990 Unspecified osteoarthritis, unspecified site: Secondary | ICD-10-CM | POA: Diagnosis not present

## 2017-06-17 DIAGNOSIS — Z7982 Long term (current) use of aspirin: Secondary | ICD-10-CM | POA: Diagnosis not present

## 2017-06-17 DIAGNOSIS — I10 Essential (primary) hypertension: Secondary | ICD-10-CM | POA: Diagnosis not present

## 2017-06-17 DIAGNOSIS — M1990 Unspecified osteoarthritis, unspecified site: Secondary | ICD-10-CM | POA: Diagnosis not present

## 2017-06-17 DIAGNOSIS — F0390 Unspecified dementia without behavioral disturbance: Secondary | ICD-10-CM | POA: Diagnosis not present

## 2017-06-17 DIAGNOSIS — S90422D Blister (nonthermal), left great toe, subsequent encounter: Secondary | ICD-10-CM | POA: Diagnosis not present

## 2017-06-17 DIAGNOSIS — Z9181 History of falling: Secondary | ICD-10-CM | POA: Diagnosis not present

## 2017-06-21 DIAGNOSIS — Z7982 Long term (current) use of aspirin: Secondary | ICD-10-CM | POA: Diagnosis not present

## 2017-06-21 DIAGNOSIS — F418 Other specified anxiety disorders: Secondary | ICD-10-CM | POA: Diagnosis not present

## 2017-06-21 DIAGNOSIS — G308 Other Alzheimer's disease: Secondary | ICD-10-CM | POA: Diagnosis not present

## 2017-06-21 DIAGNOSIS — M1990 Unspecified osteoarthritis, unspecified site: Secondary | ICD-10-CM | POA: Diagnosis not present

## 2017-06-21 DIAGNOSIS — I10 Essential (primary) hypertension: Secondary | ICD-10-CM | POA: Diagnosis not present

## 2017-06-21 DIAGNOSIS — G4751 Confusional arousals: Secondary | ICD-10-CM | POA: Diagnosis not present

## 2017-06-21 DIAGNOSIS — F0390 Unspecified dementia without behavioral disturbance: Secondary | ICD-10-CM | POA: Diagnosis not present

## 2017-06-21 DIAGNOSIS — S90422D Blister (nonthermal), left great toe, subsequent encounter: Secondary | ICD-10-CM | POA: Diagnosis not present

## 2017-06-21 DIAGNOSIS — F3289 Other specified depressive episodes: Secondary | ICD-10-CM | POA: Diagnosis not present

## 2017-06-21 DIAGNOSIS — R451 Restlessness and agitation: Secondary | ICD-10-CM | POA: Diagnosis not present

## 2017-06-21 DIAGNOSIS — Z9181 History of falling: Secondary | ICD-10-CM | POA: Diagnosis not present

## 2017-06-24 DIAGNOSIS — Z9181 History of falling: Secondary | ICD-10-CM | POA: Diagnosis not present

## 2017-06-24 DIAGNOSIS — Z7982 Long term (current) use of aspirin: Secondary | ICD-10-CM | POA: Diagnosis not present

## 2017-06-24 DIAGNOSIS — S90422D Blister (nonthermal), left great toe, subsequent encounter: Secondary | ICD-10-CM | POA: Diagnosis not present

## 2017-06-24 DIAGNOSIS — F0390 Unspecified dementia without behavioral disturbance: Secondary | ICD-10-CM | POA: Diagnosis not present

## 2017-06-24 DIAGNOSIS — M1990 Unspecified osteoarthritis, unspecified site: Secondary | ICD-10-CM | POA: Diagnosis not present

## 2017-06-24 DIAGNOSIS — I10 Essential (primary) hypertension: Secondary | ICD-10-CM | POA: Diagnosis not present

## 2017-06-28 DIAGNOSIS — F3289 Other specified depressive episodes: Secondary | ICD-10-CM | POA: Diagnosis not present

## 2017-06-28 DIAGNOSIS — G308 Other Alzheimer's disease: Secondary | ICD-10-CM | POA: Diagnosis not present

## 2017-06-28 DIAGNOSIS — R451 Restlessness and agitation: Secondary | ICD-10-CM | POA: Diagnosis not present

## 2017-06-28 DIAGNOSIS — G4751 Confusional arousals: Secondary | ICD-10-CM | POA: Diagnosis not present

## 2017-06-28 DIAGNOSIS — I10 Essential (primary) hypertension: Secondary | ICD-10-CM | POA: Diagnosis not present

## 2017-06-28 DIAGNOSIS — F418 Other specified anxiety disorders: Secondary | ICD-10-CM | POA: Diagnosis not present

## 2017-07-04 DIAGNOSIS — F0391 Unspecified dementia with behavioral disturbance: Secondary | ICD-10-CM | POA: Diagnosis not present

## 2017-07-08 DIAGNOSIS — Z79899 Other long term (current) drug therapy: Secondary | ICD-10-CM | POA: Diagnosis not present

## 2017-07-19 DIAGNOSIS — R600 Localized edema: Secondary | ICD-10-CM | POA: Diagnosis not present

## 2017-07-19 DIAGNOSIS — F418 Other specified anxiety disorders: Secondary | ICD-10-CM | POA: Diagnosis not present

## 2017-07-19 DIAGNOSIS — Z79899 Other long term (current) drug therapy: Secondary | ICD-10-CM | POA: Diagnosis not present

## 2017-07-19 DIAGNOSIS — R451 Restlessness and agitation: Secondary | ICD-10-CM | POA: Diagnosis not present

## 2017-07-19 DIAGNOSIS — G308 Other Alzheimer's disease: Secondary | ICD-10-CM | POA: Diagnosis not present

## 2017-07-19 DIAGNOSIS — I10 Essential (primary) hypertension: Secondary | ICD-10-CM | POA: Diagnosis not present

## 2017-07-19 DIAGNOSIS — F3289 Other specified depressive episodes: Secondary | ICD-10-CM | POA: Diagnosis not present

## 2017-07-19 DIAGNOSIS — G4751 Confusional arousals: Secondary | ICD-10-CM | POA: Diagnosis not present

## 2017-07-30 DIAGNOSIS — Z880 Allergy status to penicillin: Secondary | ICD-10-CM | POA: Diagnosis not present

## 2017-07-30 DIAGNOSIS — R4020 Unspecified coma: Secondary | ICD-10-CM | POA: Diagnosis not present

## 2017-07-30 DIAGNOSIS — S60212A Contusion of left wrist, initial encounter: Secondary | ICD-10-CM | POA: Diagnosis not present

## 2017-07-30 DIAGNOSIS — S0990XA Unspecified injury of head, initial encounter: Secondary | ICD-10-CM | POA: Diagnosis not present

## 2017-07-30 DIAGNOSIS — S098XXA Other specified injuries of head, initial encounter: Secondary | ICD-10-CM | POA: Diagnosis not present

## 2017-07-30 DIAGNOSIS — S0093XA Contusion of unspecified part of head, initial encounter: Secondary | ICD-10-CM | POA: Diagnosis not present

## 2017-07-30 DIAGNOSIS — G319 Degenerative disease of nervous system, unspecified: Secondary | ICD-10-CM | POA: Diagnosis not present

## 2017-07-30 DIAGNOSIS — D649 Anemia, unspecified: Secondary | ICD-10-CM | POA: Diagnosis not present

## 2017-07-30 DIAGNOSIS — S064X0A Epidural hemorrhage without loss of consciousness, initial encounter: Secondary | ICD-10-CM | POA: Diagnosis not present

## 2017-07-30 DIAGNOSIS — G250 Essential tremor: Secondary | ICD-10-CM | POA: Diagnosis not present

## 2017-07-30 DIAGNOSIS — Z91040 Latex allergy status: Secondary | ICD-10-CM | POA: Diagnosis not present

## 2017-07-30 DIAGNOSIS — F0390 Unspecified dementia without behavioral disturbance: Secondary | ICD-10-CM | POA: Diagnosis not present

## 2017-07-30 DIAGNOSIS — I10 Essential (primary) hypertension: Secondary | ICD-10-CM | POA: Diagnosis not present

## 2017-08-21 ENCOUNTER — Emergency Department (HOSPITAL_BASED_OUTPATIENT_CLINIC_OR_DEPARTMENT_OTHER): Payer: Medicare Other

## 2017-08-21 ENCOUNTER — Other Ambulatory Visit: Payer: Self-pay

## 2017-08-21 ENCOUNTER — Emergency Department (HOSPITAL_BASED_OUTPATIENT_CLINIC_OR_DEPARTMENT_OTHER)
Admission: EM | Admit: 2017-08-21 | Discharge: 2017-08-21 | Disposition: A | Discharge status: Home / Self Care | Payer: Medicare Other | Attending: Emergency Medicine | Admitting: Emergency Medicine

## 2017-08-21 DIAGNOSIS — S62354A Nondisplaced fracture of shaft of fourth metacarpal bone, right hand, initial encounter for closed fracture: Secondary | ICD-10-CM | POA: Diagnosis not present

## 2017-08-21 DIAGNOSIS — Y999 Unspecified external cause status: Secondary | ICD-10-CM | POA: Insufficient documentation

## 2017-08-21 DIAGNOSIS — M545 Low back pain, unspecified: Secondary | ICD-10-CM

## 2017-08-21 DIAGNOSIS — Z79899 Other long term (current) drug therapy: Secondary | ICD-10-CM | POA: Diagnosis not present

## 2017-08-21 DIAGNOSIS — I10 Essential (primary) hypertension: Secondary | ICD-10-CM | POA: Diagnosis not present

## 2017-08-21 DIAGNOSIS — Y92199 Unspecified place in other specified residential institution as the place of occurrence of the external cause: Secondary | ICD-10-CM | POA: Diagnosis not present

## 2017-08-21 DIAGNOSIS — Y939 Activity, unspecified: Secondary | ICD-10-CM | POA: Insufficient documentation

## 2017-08-21 DIAGNOSIS — Z91040 Latex allergy status: Secondary | ICD-10-CM | POA: Diagnosis not present

## 2017-08-21 DIAGNOSIS — S6991XA Unspecified injury of right wrist, hand and finger(s), initial encounter: Secondary | ICD-10-CM | POA: Diagnosis present

## 2017-08-21 DIAGNOSIS — W19XXXA Unspecified fall, initial encounter: Secondary | ICD-10-CM | POA: Diagnosis not present

## 2017-08-21 DIAGNOSIS — F0390 Unspecified dementia without behavioral disturbance: Secondary | ICD-10-CM | POA: Diagnosis not present

## 2017-08-21 DIAGNOSIS — S62356A Nondisplaced fracture of shaft of fifth metacarpal bone, right hand, initial encounter for closed fracture: Secondary | ICD-10-CM | POA: Diagnosis not present

## 2017-08-21 LAB — URINALYSIS, ROUTINE W REFLEX MICROSCOPIC
BILIRUBIN URINE: NEGATIVE
Glucose, UA: NEGATIVE mg/dL
HGB URINE DIPSTICK: NEGATIVE
KETONES UR: NEGATIVE mg/dL
Leukocytes, UA: NEGATIVE
NITRITE: NEGATIVE
PH: 7.5 (ref 5.0–8.0)
Protein, ur: NEGATIVE mg/dL
Specific Gravity, Urine: 1.01 (ref 1.005–1.030)

## 2017-08-21 MED ORDER — TRAMADOL HCL 50 MG PO TABS: 50.0000 mg | ORAL_TABLET | Freq: Four times a day (QID) | ORAL | 0 refills | Status: DC | PRN

## 2017-08-21 MED ORDER — TRAMADOL HCL 50 MG PO TABS
50.0000 mg | ORAL_TABLET | Freq: Once | ORAL | Status: AC
  Administered 2017-08-21: 50 mg via ORAL
  Filled 2017-08-21: qty 1

## 2017-08-21 NOTE — ED Notes (Signed)
Family states that pt fell on Friday at assisted living [facility] and swelling and bruisiing to rt wrist and hand and fingers ,  Middle ring and little finger are brusied

## 2017-08-21 NOTE — ED Notes (Signed)
Gold ring cut off of R ring finger, given to daughter in a specimen cup.

## 2017-08-21 NOTE — ED Notes (Signed)
To xray and ct, pt has small scratched area to lower back, family unsure if that happened  When she fell or before

## 2017-08-21 NOTE — ED Provider Notes (Signed)
[facility] EMERGENCY DEPARTMENT Provider Note   CSN: [id number] Arrival date & time: 08/21/17  1357     History   Chief Complaint Chief Complaint  Patient presents with  . Fall    HPI Pamela Callahan is a 84 y.o. female.  Patient is a 84 year old female who presents after a fall.  She has a history of dementia so history is limited.  Per her daughter, she lives in an assisted living facility at [place].  Per the nursing home staff, she had an unwitnessed fall in her room 2 days ago.  The next day they had noticed some swelling and bruising to her right hand.  It seemed to get worse today and she was brought here for further evaluation.  She has not been complaining of any other injuries.  She does have a recent injury about 6-7 months ago which was a compression fracture to her L2 spine resulting in 50% compression.  She wore a TLSO brace for about 3 months and has been doing well since that time.  She has been at her baseline mental status per daughter.  They have not noticed any signs of head trauma.  She has not had any recent fevers or other recent illnesses.      Past Medical History:  Diagnosis Date  . Allergy   . Anxiety   . Depression   . Diverticulitis   . GERD (gastroesophageal reflux disease)   . Hyperlipidemia   . Hypertension   . Low back pain   . Macular degeneration of left eye   . Osteopenia   . PAC (premature atrial contraction)     Patient Active Problem List   Diagnosis Date Noted  . Severe dementia 02/12/2017  . Fall 02/11/2017  . Depression with anxiety 02/11/2017  . Closed L2 vertebral fracture ([place]) 02/11/2017  . Traumatic epidural hematoma 02/11/2017  . Memory loss 03/26/2016  . Osteoarthritis of left knee 12/25/2015  . Hearing loss of aging 12/25/2015  . Cough 08/06/2015  . Leg weakness, bilateral 07/01/2015  . Subacute confusional state 07/01/2015  . B12 deficiency 09/17/2014  . Hyponatremia 08/30/2014  .  Fatigue 08/30/2014  . Hip hematoma, right 06/12/2014  . Rash and nonspecific skin eruption 03/12/2014  . PAC (premature atrial contraction) 03/23/2013  . Heart murmur 03/06/2013  . RBBB (right bundle branch block) 03/06/2013  . NEOPLASM OF UNCERTAIN BEHAVIOR OF SKIN 09/09/2010  . SOLAR KERATOSIS 03/24/2010  . ALLERGIC RHINITIS 09/26/2008  . Dyslipidemia 01/02/2008  . Anxiety state 01/02/2008  . GERD 01/02/2008  . Situational depression 09/26/2007  . Essential hypertension 09/26/2007  . OSTEOARTHRITIS 09/26/2007  . LOW BACK PAIN 09/26/2007  . OSTEOPENIA 09/26/2007    Past Surgical History:  Procedure Laterality Date  . BACK SURGERY    . CHOLECYSTECTOMY  2006  . TOTAL HIP ARTHROPLASTY  2008   Right    OB History    No data available       Home Medications    Prior to Admission medications   Medication Sig Start Date End Date Taking? Authorizing Provider  aspirin 81 MG tablet Take 81 mg by mouth daily.      [provider]  clorazepate (TRANXENE) 3.75 MG tablet Take 3.75 mg by mouth 2 (two) times daily as needed for anxiety.    [provider]  divalproex (DEPAKOTE) 250 MG DR tablet Take 250 mg by mouth 2 (two) times daily.    [provider]  donepezil (  ARICEPT) 10 MG tablet Take 1 tablet (10 mg total) by mouth at bedtime. Patient taking differently: Take 20 mg by mouth at bedtime.  04/14/16   Plotnikov, Evie Lacks, MD  fexofenadine (ALLEGRA) 180 MG tablet Take 1 tablet (180 mg total) by mouth daily. 12/21/11   Plotnikov, Evie Lacks, MD  fluticasone (FLONASE) 50 MCG/ACT nasal spray Place 1 spray into both nostrils daily. As needed Patient not taking: Reported on 02/10/2017 09/11/13   Plotnikov, Evie Lacks, MD  LORazepam (ATIVAN) 1 MG tablet Take 1 tablet (1 mg total) by mouth 3 (three) times daily. 02/12/17   Dhungel, Flonnie Overman, MD  Magnesium Gluconate 550 MG TABS Take 1 tablet by mouth daily.     [provider]  memantine (NAMENDA) 10 MG tablet  Take 10 mg by mouth daily.  02/06/17   [provider]  Multiple Vitamins-Minerals (PRESERVISION/LUTEIN) CAPS Take by mouth 2 (two) times daily.      [provider]  olmesartan (BENICAR) 20 MG tablet Take 1 tablet (20 mg total) by mouth daily. 03/26/16   Plotnikov, Evie Lacks, MD  oxyCODONE-acetaminophen (PERCOCET/ROXICET) 5-325 MG tablet Take 1 tablet by mouth every 6 (six) hours as needed for moderate pain. 02/12/17   Dhungel, Nishant, MD  PARoxetine (PAXIL) 20 MG tablet Take 20 mg by mouth daily.  02/06/17   [provider]  polyethylene glycol (MIRALAX / GLYCOLAX) packet Take 17 g by mouth daily as needed for mild constipation. 02/12/17   Dhungel, Flonnie Overman, MD  traMADol (ULTRAM) 50 MG tablet Take 1 tablet (50 mg total) by mouth every 6 (six) hours as needed. 08/21/17   Malvin Johns, MD  vitamin B-12 (CYANOCOBALAMIN) 1000 MCG tablet Take 1 tablet (1,000 mcg total) by mouth daily. 12/25/15   Plotnikov, Evie Lacks, MD    Family History Family History  Problem Relation Age of Onset  . Hypertension Mother   . Hypertension Other     Social History Social History   Tobacco Use  . Smoking status: Never Smoker  . Smokeless tobacco: Never Used  Substance Use Topics  . Alcohol use: No  . Drug use: No     Allergies   Latex; Ace inhibitors; Penicillins; and Yellow dye   Review of Systems Review of Systems  Unable to perform ROS: Dementia     Physical Exam Updated Vital Signs BP 137/64 (BP Location: Left Arm)   Pulse 78   Temp 98.3 F (36.8 C) (Oral)   Resp 18   Ht 5\' 8"  (1.727 m)   Wt 63.5 kg (140 lb)   SpO2 95%   BMI 21.29 kg/m   Physical Exam  Constitutional: She appears well-developed and well-nourished.  HENT:  Head: Normocephalic and atraumatic.  Eyes: Pupils are equal, round, and reactive to light.  Neck: Normal range of motion. Neck supple.  No pain to the cervical spine.  There is a small abrasion over her mid thoracic spine with some mild  underlying tenderness.  There is an old incision from prior back surgery to her lumbar spine area.  There is generalized tenderness to this area a 1-2 cm area of swelling to the right paraspinal area which is tender to palpation.  There is no warmth or erythema.  No signs of infection.  Cardiovascular: Normal rate, regular rhythm and normal heart sounds.  Pulmonary/Chest: Effort normal and breath sounds normal. No respiratory distress. She has no wheezes. She has no rales. She exhibits no tenderness.  Abdominal: Soft. Bowel sounds are normal. There  is no tenderness. There is no rebound and no guarding.  Musculoskeletal: Normal range of motion. She exhibits no edema.  Lymphadenopathy:    She has no cervical adenopathy.  Neurological: She is alert.  Patient is moving all extremities symmetrically without focal deficits.  Skin: Skin is warm and dry. No rash noted.  Psychiatric: She has a normal mood and affect.     ED Treatments / Results  Labs (all labs ordered are listed, but only abnormal results are displayed) Labs Reviewed  URINALYSIS, ROUTINE W REFLEX MICROSCOPIC    EKG  EKG Interpretation None       Radiology Dg Thoracic Spine 2 View  Result Date: 08/21/2017 CLINICAL DATA:  A 33-year-old female with fall and abrasion EXAM: THORACIC SPINE 2 VIEWS COMPARISON:  None. FINDINGS: Normal alignment of the thoracic vertebral bodies. No loss of vertebral body height or disc height. No subluxation. Normal paraspinal lines. IMPRESSION: No radiographic evidence of acute thoracic spine injury Electronically Signed   By: Suzy Bouchard M.D.   On: 08/21/2017 16:44   Ct Lumbar Spine Wo Contrast  Result Date: 08/21/2017 CLINICAL DATA:  Low back pain since falling 2 days ago. History of L2 compression fracture. EXAM: CT LUMBAR SPINE WITHOUT CONTRAST TECHNIQUE: Multidetector CT imaging of the lumbar spine was performed without intravenous contrast administration. Multiplanar CT image  reconstructions were also generated. COMPARISON:  Radiographs 04/25/2017 and 03/28/2017. Lumbar spine CT 02/10/2017. FINDINGS: Segmentation: 5 lumbar type vertebral bodies. Alignment: Relative to L3, the patient has developed approximately 8 mm of posterior listhesis of L1. Vertebrae: There is progressive loss of height and osseous retropulsion at the previously demonstrated L2 compression fracture. This results in 90% loss of vertebral body height centrally and 9 mm of osseous retropulsion. Appearance is similar to the more recent radiographs. The posterior elements are intact. No acute osseous findings are seen. Healing fracture of the right 12th rib. Paraspinal and other soft tissues: New nodular increased density within the subcutaneous fat posterior to the L2 and L3 spinous processes, measuring up to 2.9 cm on image 82, likely a superficial hematoma. No evidence of anterior paraspinal hematoma. Diffuse aortic and branch vessel atherosclerosis. Disc levels: L1-2: Progressive loss of disc height with vacuum phenomenon. Due to the osseous retropulsion, there is narrowing of the AP diameter of the canal to 9 mm. Mild foraminal narrowing is present on the left. L2-3: No significant findings. L3-4: Mild disc bulging and facet hypertrophy.  No spinal stenosis. L4-5: Chronic loss of disc height with vacuum phenomenon and endplate osteophytes. Mild facet and ligamentous hypertrophy. No significant spinal stenosis. Possible postsurgical changes on the left. L5-S1: Stable chronic mild disc space narrowing and osteophyte formation. No significant spinal stenosis. IMPRESSION: 1. No acute osseous findings. 2. Progressive loss of height at previously demonstrated L2 fracture (similar to recent radiographs). There is progressive osseous retropulsion with narrowing of the spinal canal and left foramen. 3. Stable spondylosis as described. 4. Possible subcutaneous hematoma to the L2 and L3 spinous processes. Electronically Signed    By: Richardean Sale M.D.   On: 08/21/2017 16:54   Dg Hand Complete Right  Result Date: 08/21/2017 CLINICAL DATA:  84 year old female with history of from a fall on Friday. Bruising and swelling in the right hand. EXAM: RIGHT HAND - COMPLETE 3+ VIEW COMPARISON:  No priors. FINDINGS: There are oblique fractures through the distal and fifth metacarpals. The fourth metacarpal fracture appears nondisplaced, whereas the fifth metacarpal fracture demonstrates approximately 3 mm of volar displacement  and minimal (less than 10 degrees) of volar angulation. There may be some early callus formation dorsally. Overlying soft tissues are swollen. Multifocal degenerative changes of osteoarthritis are evident in the interphalangeal joints and intercarpal joints. Chondrocalcinosis noted at the radiocarpal joint. IMPRESSION: 1. Subacute oblique fractures of the fifth fourth and fifth metacarpals, as above. 2. Chondrocalcinosis. 3. Mild degenerative changes of osteoarthritis, as above. Electronically Signed   By: Vinnie Langton M.D.   On: 08/21/2017 14:35    Procedures Procedures (including critical care time)  Medications Ordered in ED Medications  traMADol (ULTRAM) tablet 50 mg (50 mg Oral Given 08/21/17 1711)     Initial Impression / Assessment and Plan / ED Course  I have reviewed the triage vital signs and the nursing notes.  Pertinent labs & imaging results that were available during my care of the patient were reviewed by me and considered in my medical decision making (see chart for details).     Patient presents after unwitnessed fall.  Her daughter states that she is having frequent falls and is at her baseline mental status.  She does not have any suggestions of head trauma and since the accident happened 2 days ago, I did not feel that she needed CT imaging of her head.  She has no cervical spine tenderness.  She did have tenderness to her thoracic and lumbar sacral spine.  Imaging of these areas  reveals no evidence of acute bony injuries.  She does have some retropulsion of her prior L2 fracture but she does not have any evident neurologic deficits.  She does the area of swelling adjacent to this area which on CT scan appears to be a chronic hematoma formation.  It is unchanged from prior imaging studies.  Her daughter was concerned about a urinary tract infection but her urinalysis is normal.  The concern was due to a smell from her urine.  She has not had any fevers or change in mental she does have hand fractures which were mobilized in an ulnar gutter splint.  There is no adjacent wounds.  She was discharged home in good condition.  She was given a prescription for tramadol for pain.  She was given a referral to follow-up with hand surgery.  Return precautions were given.  Final Clinical Impressions(s) / ED Diagnoses   Final diagnoses:  Fall, initial encounter  Closed nondisplaced fracture of shaft of fifth metacarpal bone of right hand, initial encounter  Closed nondisplaced fracture of shaft of fourth metacarpal bone of right hand, initial encounter  Midline low back pain without sciatica, unspecified chronicity    ED Discharge Orders        Ordered    traMADol (ULTRAM) 50 MG tablet  Every 6 hours PRN     08/21/17 1746       Malvin Johns, MD 08/21/17 3903

## 2017-08-21 NOTE — ED Notes (Signed)
ED Provider at bedside. 

## 2017-08-21 NOTE — ED Notes (Signed)
Pt remains in radiology 

## 2017-08-21 NOTE — ED Triage Notes (Addendum)
Trip and fall on Friday. Pt lives at assisted living facility. Pt with bruising and swelling to R hand. Pt also has abrasion noted to thoracic spinal area.

## 2018-08-06 ENCOUNTER — Encounter (HOSPITAL_COMMUNITY): Payer: Self-pay

## 2018-08-06 ENCOUNTER — Emergency Department (HOSPITAL_COMMUNITY): Payer: Medicare Other

## 2018-08-06 ENCOUNTER — Emergency Department (HOSPITAL_COMMUNITY)
Admission: EM | Admit: 2018-08-06 | Discharge: 2018-08-06 | Disposition: A | Discharge status: Home / Self Care | Payer: Medicare Other | Attending: Emergency Medicine | Admitting: Emergency Medicine

## 2018-08-06 DIAGNOSIS — Y999 Unspecified external cause status: Secondary | ICD-10-CM | POA: Insufficient documentation

## 2018-08-06 DIAGNOSIS — Y939 Activity, unspecified: Secondary | ICD-10-CM | POA: Insufficient documentation

## 2018-08-06 DIAGNOSIS — Y929 Unspecified place or not applicable: Secondary | ICD-10-CM | POA: Diagnosis not present

## 2018-08-06 DIAGNOSIS — S40012A Contusion of left shoulder, initial encounter: Secondary | ICD-10-CM | POA: Diagnosis present

## 2018-08-06 DIAGNOSIS — W19XXXA Unspecified fall, initial encounter: Secondary | ICD-10-CM | POA: Insufficient documentation

## 2018-08-06 NOTE — ED Notes (Signed)
Patient transported to X-ray 

## 2018-08-06 NOTE — ED Notes (Addendum)
[facility] called to transport patient back to [facility] at [facility]

## 2018-08-06 NOTE — ED Provider Notes (Signed)
[facility] EMERGENCY DEPARTMENT Provider Note   CSN: [id number] Arrival date & time: 08/06/18  1521     History   Chief Complaint Chief Complaint  Patient presents with  . Fall    HPI TIFFANNI SCARFO is a 85 y.o. female.  HPI Patient is sent to the emergency department with report of a [facility].  Fall reportedly unwitnessed.  Patient had previously been sitting in her wheelchair.  She was found lying on the bathroom floor in a puddle of urine.  She has small abrasion to the left brow.  Patient did not complain of pain per triage report.  Baseline mental status is with dementia oriented to name only.  I have spoken with the patient.  She does exhibit dementia.  She answers my questions with unrelated information.  She is not showing any signs of distress.  She is alert. Past Medical History:  Diagnosis Date  . Allergy   . Anxiety   . Depression   . Diverticulitis   . GERD (gastroesophageal reflux disease)   . Hyperlipidemia   . Hypertension   . Low back pain   . Macular degeneration of left eye   . Osteopenia   . PAC (premature atrial contraction)     Patient Active Problem List   Diagnosis Date Noted  . Severe dementia ([place]) 02/12/2017  . Fall 02/11/2017  . Depression with anxiety 02/11/2017  . Closed L2 vertebral fracture ([place]) 02/11/2017  . Traumatic epidural hematoma 02/11/2017  . Memory loss 03/26/2016  . Osteoarthritis of left knee 12/25/2015  . Hearing loss of aging 12/25/2015  . Cough 08/06/2015  . Leg weakness, bilateral 07/01/2015  . Subacute confusional state 07/01/2015  . B12 deficiency 09/17/2014  . Hyponatremia 08/30/2014  . Fatigue 08/30/2014  . Hip hematoma, right 06/12/2014  . Rash and nonspecific skin eruption 03/12/2014  . PAC (premature atrial contraction) 03/23/2013  . Heart murmur 03/06/2013  . RBBB (right bundle branch block) 03/06/2013  . NEOPLASM OF UNCERTAIN BEHAVIOR OF SKIN 09/09/2010    . SOLAR KERATOSIS 03/24/2010  . ALLERGIC RHINITIS 09/26/2008  . Dyslipidemia 01/02/2008  . Anxiety state 01/02/2008  . GERD 01/02/2008  . Situational depression 09/26/2007  . Essential hypertension 09/26/2007  . OSTEOARTHRITIS 09/26/2007  . LOW BACK PAIN 09/26/2007  . OSTEOPENIA 09/26/2007    Past Surgical History:  Procedure Laterality Date  . BACK SURGERY    . CHOLECYSTECTOMY  2006  . TOTAL HIP ARTHROPLASTY  2008   Right     OB History   No obstetric history on file.      Home Medications    Prior to Admission medications   Medication Sig Start Date End Date Taking? Authorizing Provider  aspirin 81 MG tablet Take 81 mg by mouth daily.      [provider]  clorazepate (TRANXENE) 3.75 MG tablet Take 3.75 mg by mouth 2 (two) times daily as needed for anxiety.    [provider]  divalproex (DEPAKOTE) 250 MG DR tablet Take 250 mg by mouth 2 (two) times daily.    [provider]  donepezil (ARICEPT) 10 MG tablet Take 1 tablet (10 mg total) by mouth at bedtime. Patient taking differently: Take 20 mg by mouth at bedtime.  04/14/16   Plotnikov, Evie Lacks, MD  fexofenadine (ALLEGRA) 180 MG tablet Take 1 tablet (180 mg total) by mouth daily. 12/21/11   Plotnikov, Evie Lacks, MD  fluticasone (FLONASE) 50 MCG/ACT nasal spray  Place 1 spray into both nostrils daily. As needed Patient not taking: Reported on 02/10/2017 09/11/13   Plotnikov, Evie Lacks, MD  LORazepam (ATIVAN) 1 MG tablet Take 1 tablet (1 mg total) by mouth 3 (three) times daily. 02/12/17   Dhungel, Flonnie Overman, MD  Magnesium Gluconate 550 MG TABS Take 1 tablet by mouth daily.     [provider]  memantine (NAMENDA) 10 MG tablet Take 10 mg by mouth daily.  02/06/17   [provider]  Multiple Vitamins-Minerals (PRESERVISION/LUTEIN) CAPS Take by mouth 2 (two) times daily.      [provider]  olmesartan (BENICAR) 20 MG tablet Take 1 tablet (20 mg total) by mouth daily. 03/26/16    Plotnikov, Evie Lacks, MD  oxyCODONE-acetaminophen (PERCOCET/ROXICET) 5-325 MG tablet Take 1 tablet by mouth every 6 (six) hours as needed for moderate pain. 02/12/17   Dhungel, Nishant, MD  PARoxetine (PAXIL) 20 MG tablet Take 20 mg by mouth daily.  02/06/17   [provider]  polyethylene glycol (MIRALAX / GLYCOLAX) packet Take 17 g by mouth daily as needed for mild constipation. 02/12/17   Dhungel, Flonnie Overman, MD  traMADol (ULTRAM) 50 MG tablet Take 1 tablet (50 mg total) by mouth every 6 (six) hours as needed. 08/21/17   Malvin Johns, MD  vitamin B-12 (CYANOCOBALAMIN) 1000 MCG tablet Take 1 tablet (1,000 mcg total) by mouth daily. 12/25/15   Plotnikov, Evie Lacks, MD    Family History Family History  Problem Relation Age of Onset  . Hypertension Mother   . Hypertension Other     Social History Social History   Tobacco Use  . Smoking status: Never Smoker  . Smokeless tobacco: Never Used  Substance Use Topics  . Alcohol use: No  . Drug use: No     Allergies   Latex; Ace inhibitors; Penicillins; and Yellow dye   Review of Systems Review of Systems Level 5 caveat cannot obtain review of systems due to dementia  Physical Exam Updated Vital Signs BP (!) 127/101   Pulse 92   Temp 97.7 F (36.5 C) (Oral)   Resp 14   SpO2 100%   Physical Exam Constitutional:      Comments: Patient is alert and interactive.  As I enter the room she is awake watching television.  No respiratory distress no sign of pain.  HENT:     Head:     Comments: Very superficial few millimeter abrasion at the left temple which appears just to be where her glasses arm has abraded at the end of the brow slightly.  No associated hematoma or laceration.  Patient exhibits no pain to palpation over the entirety of her cranium.  No other facial contusions or abrasions.  Nares are clear. Eyes:     Extraocular Movements: Extraocular movements intact.     Pupils: Pupils are equal, round, and reactive to light.   Neck:     Comments: Patient exhibits no discomfort to palpation of the bony prominences of the cervical spine. Cardiovascular:     Rate and Rhythm: Normal rate and regular rhythm.  Pulmonary:     Effort: Pulmonary effort is normal.     Breath sounds: Normal breath sounds.     Comments: No visible contusions or abrasions to the chest wall.  No objection or pain elicited by compression of the thoracic walls. Abdominal:     General: There is no distension.     Palpations: Abdomen is soft.     Tenderness: There is  no abdominal tenderness.  Musculoskeletal:     Comments: I have examined the entirety of the patient's back and palpated all bony prominences and percussed over the kidneys.  This does not elicit any objection or discomfort from the patient.  She does have contusion of the left shoulder.  No abrasion.  She does not exhibit pain with range of motion of the wrist elbow or shoulder on the left.  No contusions or abrasions to the right upper extremity.  No objection or pain elicited by range of motion.  No pain with compression of the iliac wings or over the trochanters.  I have placed both lower extremities to range of motion with flexion and extension at the hip knees and ankles.  No sign of distress with this activity.  Skin:    General: Skin is warm and dry.  Neurological:     Comments: Patient is alert.  She answers in full sentences unrelated information to questions.  She attempts to follow some commands but seems fairly confused by simple instructions.  She can spontaneously move her extremities at will.  Psychiatric:        Mood and Affect: Mood normal.      ED Treatments / Results  Labs (all labs ordered are listed, but only abnormal results are displayed) Labs Reviewed - No data to display  EKG None  Radiology Dg Shoulder Left  Result Date: 08/06/2018 CLINICAL DATA:  Un witnessed fall. EXAM: LEFT SHOULDER - 2+ VIEW COMPARISON:  None. FINDINGS: No fracture or  dislocation.  No bone lesion. Narrowed glenohumeral joint. Marginal spurring from the inferior humeral head. Femur head lies just below the acromion consistent with a chronic full-thickness rotator cuff tear. Bones are demineralized. Soft tissues are unremarkable. IMPRESSION: 1. No fracture or dislocation. 2. Chronic full-thickness rotator cuff tear. 3. Glenohumeral joint arthropathic changes. Electronically Signed   By: Lajean Manes M.D.   On: 08/06/2018 17:27    Procedures Procedures (including critical care time)  Medications Ordered in ED Medications - No data to display   Initial Impression / Assessment and Plan / ED Course  I have reviewed the triage vital signs and the nursing notes.  Pertinent labs & imaging results that were available during my care of the patient were reviewed by me and considered in my medical decision making (see chart for details).     Patient sent for unwitnessed fall.  Patient is clinically examined.  Only positive finding is appearance of contusions that appear newer to the left shoulder.  X-rays obtained and negative.  Patient does not objectively have pain to full physical exam.  At this time recommend continued observation at nursing facility.  Final Clinical Impressions(s) / ED Diagnoses   Final diagnoses:  Fall, initial encounter  Contusion of left shoulder, initial encounter    ED Discharge Orders    None       Charlesetta Shanks, MD 08/06/18 1757

## 2018-08-06 NOTE — Discharge Instructions (Addendum)
1.  Patient is alert and in no distress.  She has minor bruising to the left shoulder.  She does not seem to have pain with range of motion.  An x-ray is negative. 2.  Patient has been interactive and alert.  She converses in response to questions.  She exhibits dementia but no distress.  Please continue to observe the patient for her baseline mental status and activity level.  If concerns or changes, consult the patient's primary physician and/or return to the emergency department.

## 2018-08-06 NOTE — ED Triage Notes (Signed)
To room via EMS from  [facility].  Pt had unwitnessed fall.  Last time pt was seen she was sitting in wheelchair.  Pt was found in bathroom door laying on left side in urine.  Pt has 1cm abrasion on outside of left eyebrow, no bleeding.  No c/o pain.  Pt has dementia oriented to name only, pt at baseline per staff.  No c/o pain.  EMS BP 140/84 HR 72 R 18 SpO2 97%  CBG 103.

## 2018-08-23 ENCOUNTER — Inpatient Hospital Stay (HOSPITAL_COMMUNITY)
Admission: EM | Admit: 2018-08-23 | Discharge: 2018-08-25 | DRG: 378 | Disposition: A | Discharge status: Hospice | Payer: Medicare Other | Attending: Internal Medicine | Admitting: Internal Medicine

## 2018-08-23 ENCOUNTER — Emergency Department (HOSPITAL_COMMUNITY): Payer: Medicare Other

## 2018-08-23 DIAGNOSIS — R569 Unspecified convulsions: Secondary | ICD-10-CM | POA: Diagnosis present

## 2018-08-23 DIAGNOSIS — F329 Major depressive disorder, single episode, unspecified: Secondary | ICD-10-CM | POA: Diagnosis present

## 2018-08-23 DIAGNOSIS — Z7189 Other specified counseling: Secondary | ICD-10-CM

## 2018-08-23 DIAGNOSIS — F0390 Unspecified dementia without behavioral disturbance: Secondary | ICD-10-CM | POA: Diagnosis present

## 2018-08-23 DIAGNOSIS — F411 Generalized anxiety disorder: Secondary | ICD-10-CM | POA: Diagnosis present

## 2018-08-23 DIAGNOSIS — Z79899 Other long term (current) drug therapy: Secondary | ICD-10-CM

## 2018-08-23 DIAGNOSIS — Z7982 Long term (current) use of aspirin: Secondary | ICD-10-CM

## 2018-08-23 DIAGNOSIS — I10 Essential (primary) hypertension: Secondary | ICD-10-CM | POA: Diagnosis present

## 2018-08-23 DIAGNOSIS — E785 Hyperlipidemia, unspecified: Secondary | ICD-10-CM | POA: Diagnosis present

## 2018-08-23 DIAGNOSIS — M1990 Unspecified osteoarthritis, unspecified site: Secondary | ICD-10-CM | POA: Diagnosis present

## 2018-08-23 DIAGNOSIS — R32 Unspecified urinary incontinence: Secondary | ICD-10-CM | POA: Diagnosis present

## 2018-08-23 DIAGNOSIS — E876 Hypokalemia: Secondary | ICD-10-CM | POA: Diagnosis present

## 2018-08-23 DIAGNOSIS — R159 Full incontinence of feces: Secondary | ICD-10-CM | POA: Diagnosis present

## 2018-08-23 DIAGNOSIS — Z9102 Food additives allergy status: Secondary | ICD-10-CM

## 2018-08-23 DIAGNOSIS — D649 Anemia, unspecified: Secondary | ICD-10-CM

## 2018-08-23 DIAGNOSIS — Z880 Allergy status to penicillin: Secondary | ICD-10-CM

## 2018-08-23 DIAGNOSIS — Z96641 Presence of right artificial hip joint: Secondary | ICD-10-CM | POA: Diagnosis present

## 2018-08-23 DIAGNOSIS — Z8249 Family history of ischemic heart disease and other diseases of the circulatory system: Secondary | ICD-10-CM

## 2018-08-23 DIAGNOSIS — K219 Gastro-esophageal reflux disease without esophagitis: Secondary | ICD-10-CM | POA: Diagnosis present

## 2018-08-23 DIAGNOSIS — K922 Gastrointestinal hemorrhage, unspecified: Secondary | ICD-10-CM | POA: Diagnosis not present

## 2018-08-23 DIAGNOSIS — R55 Syncope and collapse: Secondary | ICD-10-CM

## 2018-08-23 DIAGNOSIS — Z993 Dependence on wheelchair: Secondary | ICD-10-CM | POA: Diagnosis not present

## 2018-08-23 DIAGNOSIS — M8580 Other specified disorders of bone density and structure, unspecified site: Secondary | ICD-10-CM | POA: Diagnosis present

## 2018-08-23 DIAGNOSIS — G40909 Epilepsy, unspecified, not intractable, without status epilepticus: Secondary | ICD-10-CM | POA: Diagnosis present

## 2018-08-23 DIAGNOSIS — D509 Iron deficiency anemia, unspecified: Secondary | ICD-10-CM | POA: Diagnosis present

## 2018-08-23 DIAGNOSIS — E872 Acidosis, unspecified: Secondary | ICD-10-CM | POA: Diagnosis present

## 2018-08-23 DIAGNOSIS — K921 Melena: Principal | ICD-10-CM | POA: Diagnosis present

## 2018-08-23 DIAGNOSIS — D62 Acute posthemorrhagic anemia: Secondary | ICD-10-CM | POA: Diagnosis present

## 2018-08-23 DIAGNOSIS — F03C0 Unspecified dementia, severe, without behavioral disturbance, psychotic disturbance, mood disturbance, and anxiety: Secondary | ICD-10-CM | POA: Diagnosis present

## 2018-08-23 DIAGNOSIS — R451 Restlessness and agitation: Secondary | ICD-10-CM | POA: Diagnosis present

## 2018-08-23 DIAGNOSIS — D72829 Elevated white blood cell count, unspecified: Secondary | ICD-10-CM | POA: Diagnosis present

## 2018-08-23 DIAGNOSIS — D500 Iron deficiency anemia secondary to blood loss (chronic): Secondary | ICD-10-CM | POA: Diagnosis present

## 2018-08-23 DIAGNOSIS — Z888 Allergy status to other drugs, medicaments and biological substances status: Secondary | ICD-10-CM

## 2018-08-23 DIAGNOSIS — H3530 Unspecified macular degeneration: Secondary | ICD-10-CM | POA: Diagnosis present

## 2018-08-23 DIAGNOSIS — Z9049 Acquired absence of other specified parts of digestive tract: Secondary | ICD-10-CM

## 2018-08-23 DIAGNOSIS — Z66 Do not resuscitate: Secondary | ICD-10-CM | POA: Diagnosis present

## 2018-08-23 DIAGNOSIS — Z515 Encounter for palliative care: Secondary | ICD-10-CM

## 2018-08-23 DIAGNOSIS — Z91040 Latex allergy status: Secondary | ICD-10-CM

## 2018-08-23 LAB — I-STAT TROPONIN, ED: Troponin i, poc: 0 ng/mL (ref 0.00–0.08)

## 2018-08-23 LAB — CBC WITH DIFFERENTIAL/PLATELET
Abs Immature Granulocytes: 0.27 10*3/uL — ABNORMAL HIGH (ref 0.00–0.07)
BASOS ABS: 0 10*3/uL (ref 0.0–0.1)
Basophils Relative: 0 %
Eosinophils Absolute: 0 10*3/uL (ref 0.0–0.5)
Eosinophils Relative: 0 %
HCT: 24.2 % — ABNORMAL LOW (ref 36.0–46.0)
Hemoglobin: 7.5 g/dL — ABNORMAL LOW (ref 12.0–15.0)
Immature Granulocytes: 2 %
Lymphocytes Relative: 5 %
Lymphs Abs: 0.7 10*3/uL (ref 0.7–4.0)
MCH: 31.4 pg (ref 26.0–34.0)
MCHC: 31 g/dL (ref 30.0–36.0)
MCV: 101.3 fL — ABNORMAL HIGH (ref 80.0–100.0)
Monocytes Absolute: 0.5 10*3/uL (ref 0.1–1.0)
Monocytes Relative: 4 %
NRBC: 0 % (ref 0.0–0.2)
Neutro Abs: 11 10*3/uL — ABNORMAL HIGH (ref 1.7–7.7)
Neutrophils Relative %: 89 %
PLATELETS: 464 10*3/uL — AB (ref 150–400)
RBC: 2.39 MIL/uL — ABNORMAL LOW (ref 3.87–5.11)
RDW: 14.6 % (ref 11.5–15.5)
WBC: 12.5 10*3/uL — ABNORMAL HIGH (ref 4.0–10.5)

## 2018-08-23 LAB — COMPREHENSIVE METABOLIC PANEL
ALT: 18 U/L (ref 0–44)
AST: 18 U/L (ref 15–41)
Albumin: 2.6 g/dL — ABNORMAL LOW (ref 3.5–5.0)
Alkaline Phosphatase: 81 U/L (ref 38–126)
Anion gap: 13 (ref 5–15)
BUN: 56 mg/dL — ABNORMAL HIGH (ref 8–23)
CO2: 23 mmol/L (ref 22–32)
Calcium: 8.3 mg/dL — ABNORMAL LOW (ref 8.9–10.3)
Chloride: 100 mmol/L (ref 98–111)
Creatinine, Ser: 0.81 mg/dL (ref 0.44–1.00)
GFR calc Af Amer: >60 mL/min (ref >60)
GFR calc non Af Amer: >60 mL/min (ref >60)
Glucose, Bld: 149 mg/dL — ABNORMAL HIGH (ref 70–99)
Potassium: 4.4 mmol/L (ref 3.5–5.1)
Sodium: 136 mmol/L (ref 135–145)
Total Bilirubin: 0.6 mg/dL (ref 0.3–1.2)
Total Protein: 5.6 g/dL — ABNORMAL LOW (ref 6.5–8.1)

## 2018-08-23 LAB — PROTIME-INR
INR: 1.15
Prothrombin Time: 14.6 seconds (ref 11.4–15.2)

## 2018-08-23 LAB — LACTIC ACID, PLASMA
Lactic Acid, Venous: 3.3 mmol/L (ref 0.5–1.9)
Lactic Acid, Venous: 1.2 mmol/L (ref 0.5–1.9)

## 2018-08-23 LAB — TSH: TSH: 6.503 u[IU]/mL — AB (ref 0.350–4.500)

## 2018-08-23 LAB — PREPARE RBC (CROSSMATCH)

## 2018-08-23 LAB — CBG MONITORING, ED: Glucose-Capillary: 137 mg/dL — ABNORMAL HIGH (ref 70–99)

## 2018-08-23 LAB — POC OCCULT BLOOD, ED
Fecal Occult Bld: POSITIVE — AB
Fecal Occult Bld: POSITIVE — AB

## 2018-08-23 LAB — LIPASE, BLOOD: Lipase: 33 U/L (ref 11–51)

## 2018-08-23 LAB — ABO/RH: ABO/RH(D): O POS

## 2018-08-23 LAB — MAGNESIUM: MAGNESIUM: 2 mg/dL (ref 1.7–2.4)

## 2018-08-23 LAB — VALPROIC ACID LEVEL: Valproic Acid Lvl: 26 ug/mL — ABNORMAL LOW (ref 50.0–100.0)

## 2018-08-23 LAB — AMMONIA: Ammonia: 9 umol/L (ref 9–35)

## 2018-08-23 MED ORDER — PANTOPRAZOLE SODIUM 40 MG IV SOLR
40.0000 mg | Freq: Two times a day (BID) | INTRAVENOUS | Status: DC
  Administered 2018-08-23 – 2018-08-25 (×4): 40 mg via INTRAVENOUS
  Filled 2018-08-23 (×4): qty 40

## 2018-08-23 MED ORDER — ACETAMINOPHEN 650 MG RE SUPP: 650.0000 mg | Freq: Four times a day (QID) | RECTAL | Status: DC | PRN

## 2018-08-23 MED ORDER — DIVALPROEX SODIUM 250 MG PO DR TAB
250.0000 mg | DELAYED_RELEASE_TABLET | Freq: Three times a day (TID) | ORAL | Status: DC
  Administered 2018-08-23 – 2018-08-25 (×6): 250 mg via ORAL
  Filled 2018-08-23 (×6): qty 1

## 2018-08-23 MED ORDER — DEXTROSE-NACL 5-0.9 % IV SOLN
INTRAVENOUS | Status: DC
  Administered 2018-08-23 – 2018-08-24 (×2): via INTRAVENOUS

## 2018-08-23 MED ORDER — SODIUM CHLORIDE 0.9% IV SOLUTION: Freq: Once | INTRAVENOUS | Status: DC

## 2018-08-23 MED ORDER — LORAZEPAM 0.5 MG PO TABS
0.2500 mg | ORAL_TABLET | Freq: Two times a day (BID) | ORAL | Status: DC
  Administered 2018-08-24 – 2018-08-25 (×4): 0.25 mg via ORAL
  Filled 2018-08-23 (×4): qty 1

## 2018-08-23 MED ORDER — ONDANSETRON HCL 4 MG/2ML IJ SOLN: 4.0000 mg | Freq: Four times a day (QID) | INTRAMUSCULAR | Status: DC | PRN

## 2018-08-23 MED ORDER — ACETAMINOPHEN 325 MG PO TABS: 650.0000 mg | ORAL_TABLET | Freq: Four times a day (QID) | ORAL | Status: DC | PRN

## 2018-08-23 MED ORDER — RISPERIDONE 0.5 MG PO TABS: 0.2500 mg | ORAL_TABLET | Freq: Two times a day (BID) | ORAL | Status: DC | PRN

## 2018-08-23 MED ORDER — DIVALPROEX SODIUM 250 MG PO DR TAB: 250.0000 mg | DELAYED_RELEASE_TABLET | Freq: Two times a day (BID) | ORAL | Status: DC

## 2018-08-23 MED ORDER — SODIUM CHLORIDE 0.9 % IV BOLUS
1000.0000 mL | Freq: Once | INTRAVENOUS | Status: AC
  Administered 2018-08-23: 1000 mL via INTRAVENOUS

## 2018-08-23 MED ORDER — ONDANSETRON HCL 4 MG PO TABS: 4.0000 mg | ORAL_TABLET | Freq: Four times a day (QID) | ORAL | Status: DC | PRN

## 2018-08-23 MED ORDER — MEMANTINE HCL 10 MG PO TABS: 10.0000 mg | ORAL_TABLET | Freq: Every day | ORAL | Status: DC

## 2018-08-23 MED ORDER — CITALOPRAM HYDROBROMIDE 20 MG PO TABS
20.0000 mg | ORAL_TABLET | Freq: Every day | ORAL | Status: DC
  Administered 2018-08-24 – 2018-08-25 (×2): 20 mg via ORAL
  Filled 2018-08-23 (×2): qty 1

## 2018-08-23 MED ORDER — VALPROATE SODIUM 500 MG/5ML IV SOLN
250.0000 mg | Freq: Once | INTRAVENOUS | Status: AC
  Administered 2018-08-23: 250 mg via INTRAVENOUS
  Filled 2018-08-23: qty 2.5

## 2018-08-23 MED ORDER — DONEPEZIL HCL 10 MG PO TABS
10.0000 mg | ORAL_TABLET | Freq: Every day | ORAL | Status: DC
  Administered 2018-08-23 – 2018-08-24 (×2): 10 mg via ORAL
  Filled 2018-08-23 (×2): qty 1

## 2018-08-23 NOTE — Progress Notes (Signed)
Pt admitted to 5W room 5. Alert, disoriented. Tele monitor placed. VS stable. Son at bedside. All questions/concerns addressed. Will continue to monitor.

## 2018-08-23 NOTE — ED Notes (Signed)
Blood infused  No reaction

## 2018-08-23 NOTE — ED Notes (Signed)
Concern from NP at bedside for GI bleed with dark stools x 3 days, no hx of seizures with med hx of taking Depakote

## 2018-08-23 NOTE — ED Notes (Signed)
pts son is at the beside  Pt agitated  She does not know her son that is normal.  Attempting to pull her lt warist iv out  kling dressing p[lacxed on the pts iv side  nss bolus started

## 2018-08-23 NOTE — ED Notes (Signed)
Pts son Pamela Callahan provides verbal consent via phone for blood transfusion d/t pt being demented this RN and Vicente Males RN witnesses for approval on plan of care

## 2018-08-23 NOTE — ED Notes (Signed)
Date and time results received: 08/23/18 1:02 PM    Test: lactic acid Critical Value: 3.3  Name of Provider Notified: [place]

## 2018-08-23 NOTE — H&P (Signed)
History and Physical    Pamela Callahan YHC:[id number] DOB: [birth year] DOA: 08/23/2018  PCP: Pamela Anger, MD  Patient coming from: Skilled nursing facility.  I have personally briefly reviewed patient's old medical records in [facility] everywhere.   Chief Complaint: Passed out.  HPI: Pamela Callahan is a 85 y.o. female with medical history significant of seizure disorder, advanced dementia, GERD, anxiety, depression, long-term nursing home resident brought from the nursing home where she was found confused and possible episode of seizure.  Patient is with advanced dementia.  She just says yes and no.  I reviewed her previous admissions.  Her nursing home records have not arrived, however ER was called in by provider from the nursing home and given history.  I discussed case with ER physician. As per nursing home records, patient has had dark tarry stool for at least 3 days now.  She is on aspirin.  Today morning, patient had an episode where she was unresponsive and had left arm shaking and she was very confused and sleepy after that episode.  Patient herself denies any complaints.  By the time patient arrived to the emergency room, she had no focal deficit.  She is awake and alert, however not oriented as her baseline. ED Course: In the emergency room, patient is hemodynamically stable.  Patient does not have any focal neurological deficit.  She has mild leukocytosis.  She has hemoglobin of 7.5 which was 12 about a year ago.  Venous lactate is 3.3.  FOBT was positive as per ER physician.  Valproic acid level is 25 and subtherapeutic.  Urinalysis is pending.  CT head is essentially normal.  Chest x-ray is normal.  Patient was started on 1 unit of PRBC in the ER.  Review of Systems: Limited due to advanced dementia.   Past Medical History:  Diagnosis Date  . Allergy   . Anxiety   . Depression   . Diverticulitis   . GERD (gastroesophageal reflux disease)   . Hyperlipidemia    . Hypertension   . Low back pain   . Macular degeneration of left eye   . Osteopenia   . PAC (premature atrial contraction)     Past Surgical History:  Procedure Laterality Date  . BACK SURGERY    . CHOLECYSTECTOMY  2006  . TOTAL HIP ARTHROPLASTY  2008   Right     reports that she has never smoked. She has never used smokeless tobacco. She reports that she does not drink alcohol or use drugs.  Allergies  Allergen Reactions  . Latex Rash    "Allergic," per MAR  . Ace Inhibitors Cough    "Allergic," per MAR  . Penicillins Other (See Comments)    "Allergic," per [facility] Did it involve swelling of the face/tongue/throat, SOB, or low BP? Unk Did it involve sudden or severe rash/hives, skin peeling, or any reaction on the inside of your mouth or nose? Unk Did you need to seek medical attention at a hospital or doctor's office? Unk When did it last happen? Unk If all above answers are "NO", may proceed with cephalosporin use.   . Yellow Dye Other (See Comments)    "Allergic," per [facility]    Family History  Problem Relation Age of Onset  . Hypertension Mother   . Hypertension Other      Prior to Admission medications   Medication Sig Start Date End Date Taking? Authorizing Provider  aspirin 81 MG tablet Take 81 mg by  mouth daily.      [provider]  clorazepate (TRANXENE) 3.75 MG tablet Take 3.75 mg by mouth 2 (two) times daily as needed for anxiety.    [provider]  divalproex (DEPAKOTE) 250 MG DR tablet Take 250 mg by mouth 2 (two) times daily.    [provider]  donepezil (ARICEPT) 10 MG tablet Take 1 tablet (10 mg total) by mouth at bedtime. Patient taking differently: Take 20 mg by mouth at bedtime.  04/14/16   Plotnikov, Evie Lacks, MD  fexofenadine (ALLEGRA) 180 MG tablet Take 1 tablet (180 mg total) by mouth daily. 12/21/11   Plotnikov, Evie Lacks, MD  fluticasone (FLONASE) 50 MCG/ACT nasal spray Place 1 spray into both nostrils daily. As  needed 09/11/13   Plotnikov, Evie Lacks, MD  LORazepam (ATIVAN) 1 MG tablet Take 1 tablet (1 mg total) by mouth 3 (three) times daily. 02/12/17   Dhungel, Flonnie Overman, MD  Magnesium Gluconate 550 MG TABS Take 1 tablet by mouth daily.     [provider]  memantine (NAMENDA) 10 MG tablet Take 10 mg by mouth daily.  02/06/17   [provider]  Multiple Vitamins-Minerals (PRESERVISION/LUTEIN) CAPS Take by mouth 2 (two) times daily.      [provider]  olmesartan (BENICAR) 20 MG tablet Take 1 tablet (20 mg total) by mouth daily. 03/26/16   Plotnikov, Evie Lacks, MD  oxyCODONE-acetaminophen (PERCOCET/ROXICET) 5-325 MG tablet Take 1 tablet by mouth every 6 (six) hours as needed for moderate pain. 02/12/17   Dhungel, Nishant, MD  PARoxetine (PAXIL) 20 MG tablet Take 20 mg by mouth daily.  02/06/17   [provider]  polyethylene glycol (MIRALAX / GLYCOLAX) packet Take 17 g by mouth daily as needed for mild constipation. 02/12/17   Dhungel, Flonnie Overman, MD  traMADol (ULTRAM) 50 MG tablet Take 1 tablet (50 mg total) by mouth every 6 (six) hours as needed. 08/21/17   Malvin Johns, MD  vitamin B-12 (CYANOCOBALAMIN) 1000 MCG tablet Take 1 tablet (1,000 mcg total) by mouth daily. 12/25/15   Plotnikov, Evie Lacks, MD    Physical Exam: Vitals:   08/23/18 1415 08/23/18 1430 08/23/18 1445 08/23/18 1500  BP: 119/62 (!) 108/53 (!) 126/57 (!) 108/55  Pulse:  67 72 72  Resp:   13 14  Temp:      TempSrc:      SpO2:  99% 100% 100%    Constitutional: NAD, calm, comfortable Vitals:   08/23/18 1415 08/23/18 1430 08/23/18 1445 08/23/18 1500  BP: 119/62 (!) 108/53 (!) 126/57 (!) 108/55  Pulse:  67 72 72  Resp:   13 14  Temp:      TempSrc:      SpO2:  99% 100% 100%   Eyes: PERRL, lids and conjunctivae normal.  Patient is thinly built.  She is alert and awake, however not oriented.  Pleasantly confused. ENMT: Mucous membranes are moist. Posterior pharynx clear of any exudate or lesions.Normal  dentition.  Neck: normal, supple, no masses, no thyromegaly Respiratory: clear to auscultation bilaterally, no wheezing, no crackles. Normal respiratory effort. No accessory muscle use.  Cardiovascular: Regular rate and rhythm, systolic murmur at the apex.  No rubs / gallops. No extremity edema. 2+ pedal pulses. No carotid bruits.  Abdomen: no tenderness, no masses palpated. No hepatosplenomegaly. Bowel sounds positive.  Musculoskeletal: no clubbing / cyanosis. No joint deformity upper and lower extremities. Good ROM, no contractures. Normal muscle tone.  Skin: no rashes, lesions, ulcers. No induration  Neurologic: CN 2-12 grossly intact. Sensation intact, DTR normal. Strength 5/5 in all 4.  Psychiatric: Normal judgment and insight. Alert but pleasantly confused.    Labs on Admission: I have personally reviewed following labs and imaging studies  CBC: Recent Labs  Lab 08/23/18 1150  WBC 12.5*  NEUTROABS 11.0*  HGB 7.5*  HCT 24.2*  MCV 101.3*  PLT 902*   Basic Metabolic Panel: Recent Labs  Lab 08/23/18 1150  NA 136  K 4.4  CL 100  CO2 23  GLUCOSE 149*  BUN 56*  CREATININE 0.81  CALCIUM 8.3*  MG 2.0   GFR: CrCl cannot be calculated (Unknown ideal weight.). Liver Function Tests: Recent Labs  Lab 08/23/18 1150  AST 18  ALT 18  ALKPHOS 81  BILITOT 0.6  PROT 5.6*  ALBUMIN 2.6*   Recent Labs  Lab 08/23/18 1150  LIPASE 33   Recent Labs  Lab 08/23/18 1148  AMMONIA 9   Coagulation Profile: Recent Labs  Lab 08/23/18 1150  INR 1.15   Cardiac Enzymes: No results for input(s): CKTOTAL, CKMB, CKMBINDEX, TROPONINI in the last 168 hours. BNP (last 3 results) No results for input(s): PROBNP in the last 8760 hours. HbA1C: No results for input(s): HGBA1C in the last 72 hours. CBG: Recent Labs  Lab 08/23/18 1141  GLUCAP 137*   Lipid Profile: No results for input(s): CHOL, HDL, LDLCALC, TRIG, CHOLHDL, LDLDIRECT in the last 72 hours. Thyroid Function  Tests: Recent Labs    08/23/18 1134  TSH 6.503*   Anemia Panel: No results for input(s): VITAMINB12, FOLATE, FERRITIN, TIBC, IRON, RETICCTPCT in the last 72 hours. Urine analysis:    Component Value Date/Time   COLORURINE YELLOW 08/21/2017 1723   APPEARANCEUR CLEAR 08/21/2017 1723   LABSPEC 1.010 08/21/2017 1723   PHURINE 7.5 08/21/2017 1723   GLUCOSEU NEGATIVE 08/21/2017 1723   GLUCOSEU NEGATIVE 07/01/2015 1647   HGBUR NEGATIVE 08/21/2017 1723   BILIRUBINUR NEGATIVE 08/21/2017 1723   KETONESUR NEGATIVE 08/21/2017 1723   PROTEINUR NEGATIVE 08/21/2017 1723   UROBILINOGEN 0.2 07/01/2015 1647   NITRITE NEGATIVE 08/21/2017 1723   LEUKOCYTESUR NEGATIVE 08/21/2017 1723    Radiological Exams on Admission: Ct Head Wo Contrast  Result Date: 08/23/2018 CLINICAL DATA:  Unresponsive episode with altered mental status and confusion. EXAM: CT HEAD WITHOUT CONTRAST TECHNIQUE: Contiguous axial images were obtained from the base of the skull through the vertex without intravenous contrast. COMPARISON:  February 21, 2018. FINDINGS: Brain: Moderate diffuse atrophy is stable. There is no intracranial mass, hemorrhage, extra-axial fluid collection, or midline shift. There is small vessel disease in the centra semiovale bilaterally, stable. Small vessel disease is noted in the the right lentiform nucleus and in portions of each internal capsule anteriorly. No acute infarct is evident. Vascular: No hyperdense vessel. There is calcification in each carotid siphon region. Skull: The bony calvarium appears intact. Sinuses/Orbits: There is mucosal thickening in several ethmoid air cells. Visualized paranasal sinuses elsewhere clear. Orbits appear symmetric bilaterally. Patient appears to have undergone cataract removal on the left. Other: Mastoid air cells are clear. There is debris in each external auditory canal. IMPRESSION: Stable atrophy with supratentorial small vessel disease. No acute infarct evident. No mass  or hemorrhage. There are foci of arterial vascular calcification. There is mucosal thickening in several ethmoid air cells. There is probable cerumen in each external auditory canal. Electronically Signed   By: Lowella Grip III M.D.   On: 08/23/2018 12:27   Dg Chest Portable 1 View  Result  Date: 08/23/2018 CLINICAL DATA:  Syncope and GI bleed EXAM: PORTABLE CHEST 1 VIEW COMPARISON:  08/21/2017 FINDINGS: Unchanged cardiomediastinal contours with calcific aortic atherosclerosis. Clear lungs. No pleural effusion or pneumothorax. IMPRESSION: No active disease. Electronically Signed   By: Ulyses Jarred M.D.   On: 08/23/2018 13:52    EKG: Independently reviewed. 12 lead EKG is normal sinus rhythm, RBBB present on previous EKG  Assessment/Plan Principal Problem:   Syncope and collapse Active Problems:   Dyslipidemia   Anxiety state   Essential hypertension   Severe dementia (HCC)   Iron deficiency anemia due to chronic blood loss   Lactic acidosis   Seizure (HCC)   Syncope and collapse: Unknown whether patient had a breakthrough seizure or hypotension episode from acute on chronic GI bleeding.  Patient is currently hemodynamically neurologically stable.  Agree with admission to monitored unit.  Seizure disorder: Suspect breakthrough seizure.  Currently neurologically stable.  No focal deficit.  Depakote level was subtherapeutic.  Will give 1 dose of IV Depakote and resume her home medications to 250 mg twice a day.  Patient apparently on benzodiazepine that she will continue.  Seizure precautions, fall precautions.  Symptomatic anemia: Patient is on aspirin.  Unknown whether patient is on any other NSAID's.  FOBT positive.  Significant drop in hemoglobin.  May further drop with hemodilution.  Currently hemodynamically stable.  Hold aspirin.  Will start patient on Protonix IV twice daily.  GI consulted from ER.  May need upper GI endoscopy.  Received 1 unit of PRBC in the ER, will hold off on  further transfusion.  Will recheck after transfusion to ensure stabilization.  Lactic acidosis: Suspected due to seizure disorder.  Hydrate and recheck levels.  No evidence of infection at this time.  Hypertension: Borderline low blood pressures.  Hold all antihypertensive medications.  Severe dementia without behavioral disturbances: A long-term nursing home resident.  She will go back to nursing home when stable.  Patient's son arrived to the hospital.  I had conversation with him.  Confirmed DNR/DNI. We discussed about probable seizure and loading with antiseizure medication. She has history of diverticulitis.  We discussed that patient may need to go for upper GI endoscopy and will be seen by GI.  Patient probably cannot tolerate colonoscopy.  We may just do symptomatic treatment.   DVT prophylaxis: SCDs. Code Status: DNR/DNI. Family Communication: Son at bedside. Disposition Plan: Long-term nursing home. Consults called: Gastroenterology. Admission status: Inpatient.   Barb Merino MD [facility] Pager [phone]  If 7PM-7AM, please contact night-coverage [email or web address] Password [facility]  08/23/2018, 3:18 PM

## 2018-08-23 NOTE — ED Provider Notes (Signed)
[facility]ER[facility] DEPARTMENT Provider Note   CSN: [id number] Arrival date & time: 08/23/18  1047     History   Chief Complaint Chief Complaint  Patient presents with  . Seizures    HPI Pamela Callahan is a 85 y.o. female.  The history is provided by the EMS personnel, the patient and medical records. No language interpreter was used.  Seizures  Seizure activity on arrival: no   Seizure type:  Unable to specify Initial focality:  None (Reported left arm shaking with unresponsiveness.) Episode characteristics: focal shaking and unresponsiveness   Postictal symptoms: confusion and somnolence   Return to baseline: yes   Severity:  Severe Timing:  Once Progression:  Resolved Recent head injury:  No recent head injuries PTA treatment:  None History of seizures: yes   Rectal Bleeding  Associated symptoms: light-headedness   Associated symptoms: no abdominal pain, no dizziness, no fever and no vomiting     Past Medical History:  Diagnosis Date  . Allergy   . Anxiety   . Depression   . Diverticulitis   . GERD (gastroesophageal reflux disease)   . Hyperlipidemia   . Hypertension   . Low back pain   . Macular degeneration of left eye   . Osteopenia   . PAC (premature atrial contraction)     Patient Active Problem List   Diagnosis Date Noted  . Severe dementia ([place]) 02/12/2017  . Fall 02/11/2017  . Depression with anxiety 02/11/2017  . Closed L2 vertebral fracture ([place]) 02/11/2017  . Traumatic epidural hematoma 02/11/2017  . Memory loss 03/26/2016  . Osteoarthritis of left knee 12/25/2015  . Hearing loss of aging 12/25/2015  . Cough 08/06/2015  . Leg weakness, bilateral 07/01/2015  . Subacute confusional state 07/01/2015  . B12 deficiency 09/17/2014  . Hyponatremia 08/30/2014  . Fatigue 08/30/2014  . Hip hematoma, right 06/12/2014  . Rash and nonspecific skin eruption 03/12/2014  . PAC (premature atrial contraction) 03/23/2013  .  Heart murmur 03/06/2013  . RBBB (right bundle branch block) 03/06/2013  . NEOPLASM OF UNCERTAIN BEHAVIOR OF SKIN 09/09/2010  . SOLAR KERATOSIS 03/24/2010  . ALLERGIC RHINITIS 09/26/2008  . Dyslipidemia 01/02/2008  . Anxiety state 01/02/2008  . GERD 01/02/2008  . Situational depression 09/26/2007  . Essential hypertension 09/26/2007  . OSTEOARTHRITIS 09/26/2007  . LOW BACK PAIN 09/26/2007  . OSTEOPENIA 09/26/2007    Past Surgical History:  Procedure Laterality Date  . BACK SURGERY    . CHOLECYSTECTOMY  2006  . TOTAL HIP ARTHROPLASTY  2008   Right     OB History   No obstetric history on file.      Home Medications    Prior to Admission medications   Medication Sig Start Date End Date Taking? Authorizing Provider  aspirin 81 MG tablet Take 81 mg by mouth daily.      [provider]  clorazepate (TRANXENE) 3.75 MG tablet Take 3.75 mg by mouth 2 (two) times daily as needed for anxiety.    [provider]  divalproex (DEPAKOTE) 250 MG DR tablet Take 250 mg by mouth 2 (two) times daily.    [provider]  donepezil (ARICEPT) 10 MG tablet Take 1 tablet (10 mg total) by mouth at bedtime. Patient taking differently: Take 20 mg by mouth at bedtime.  04/14/16   Plotnikov, Evie Lacks, MD  fexofenadine (ALLEGRA) 180 MG tablet Take 1 tablet (180 mg total) by mouth daily. 12/21/11   Plotnikov, Evie Lacks, MD  fluticasone (FLONASE) 50 MCG/ACT nasal spray Place 1 spray into both nostrils daily. As needed Patient not taking: Reported on 02/10/2017 09/11/13   Plotnikov, Evie Lacks, MD  LORazepam (ATIVAN) 1 MG tablet Take 1 tablet (1 mg total) by mouth 3 (three) times daily. 02/12/17   Dhungel, Flonnie Overman, MD  Magnesium Gluconate 550 MG TABS Take 1 tablet by mouth daily.     [provider]  memantine (NAMENDA) 10 MG tablet Take 10 mg by mouth daily.  02/06/17   [provider]  Multiple Vitamins-Minerals (PRESERVISION/LUTEIN) CAPS Take by mouth 2 (two) times  daily.      [provider]  olmesartan (BENICAR) 20 MG tablet Take 1 tablet (20 mg total) by mouth daily. 03/26/16   Plotnikov, Evie Lacks, MD  oxyCODONE-acetaminophen (PERCOCET/ROXICET) 5-325 MG tablet Take 1 tablet by mouth every 6 (six) hours as needed for moderate pain. 02/12/17   Dhungel, Nishant, MD  PARoxetine (PAXIL) 20 MG tablet Take 20 mg by mouth daily.  02/06/17   [provider]  polyethylene glycol (MIRALAX / GLYCOLAX) packet Take 17 g by mouth daily as needed for mild constipation. 02/12/17   Dhungel, Flonnie Overman, MD  traMADol (ULTRAM) 50 MG tablet Take 1 tablet (50 mg total) by mouth every 6 (six) hours as needed. 08/21/17   Malvin Johns, MD  vitamin B-12 (CYANOCOBALAMIN) 1000 MCG tablet Take 1 tablet (1,000 mcg total) by mouth daily. 12/25/15   Plotnikov, Evie Lacks, MD    Family History Family History  Problem Relation Age of Onset  . Hypertension Mother   . Hypertension Other     Social History Social History   Tobacco Use  . Smoking status: Never Smoker  . Smokeless tobacco: Never Used  Substance Use Topics  . Alcohol use: No  . Drug use: No     Allergies   Latex; Ace inhibitors; Penicillins; and Yellow dye   Review of Systems Review of Systems  Constitutional: Positive for fatigue. Negative for chills, diaphoresis and fever.  HENT: Negative for congestion and rhinorrhea.   Eyes: Negative for visual disturbance.  Respiratory: Negative for cough, chest tightness, shortness of breath and wheezing.   Cardiovascular: Negative for chest pain and palpitations.  Gastrointestinal: Positive for hematochezia. Negative for abdominal pain, constipation, diarrhea, nausea and vomiting.  Genitourinary: Negative for decreased urine volume, dysuria, flank pain and frequency.  Musculoskeletal: Negative for back pain and neck pain.  Skin: Negative for rash and wound.  Neurological: Positive for seizures, syncope (vs seizure) and light-headedness. Negative for  dizziness, speech difficulty and headaches.  Psychiatric/Behavioral: Negative for agitation.  All other systems reviewed and are negative.    Physical Exam Updated Vital Signs BP (!) 108/53   Pulse 67   Temp 98.7 F (37.1 C) (Oral)   Resp 15   SpO2 99%   Physical Exam Vitals signs and nursing note reviewed.  Constitutional:      General: She is not in acute distress.    Appearance: She is well-developed. She is not ill-appearing, toxic-appearing or diaphoretic.  HENT:     Head: Normocephalic and atraumatic.     Nose: No congestion or rhinorrhea.     Mouth/Throat:     Pharynx: No oropharyngeal exudate or posterior oropharyngeal erythema.  Eyes:     Conjunctiva/sclera: Conjunctivae normal.  Neck:     Musculoskeletal: Normal range of motion and neck supple.  Cardiovascular:     Rate and Rhythm: Normal rate and regular rhythm.     Pulses: Normal  pulses.     Heart sounds: Murmur present.  Pulmonary:     Effort: Pulmonary effort is normal. No respiratory distress.     Breath sounds: Normal breath sounds. No wheezing, rhonchi or rales.  Abdominal:     General: Abdomen is flat. There is no distension.     Palpations: Abdomen is soft.     Tenderness: There is no abdominal tenderness.  Genitourinary:    Rectum: Guaiac result positive.     Comments: Dark stool on DRE  Musculoskeletal:        General: No tenderness.     Right lower leg: No edema.     Left lower leg: No edema.  Skin:    General: Skin is warm and dry.     Capillary Refill: Capillary refill takes less than 2 seconds.  Neurological:     General: No focal deficit present.     Mental Status: She is alert.     GCS: GCS eye subscore is 4. GCS verbal subscore is 5. GCS motor subscore is 6.     Cranial Nerves: No dysarthria.     Sensory: No sensory deficit.     Motor: Tremor present. No weakness or abnormal muscle tone.  Psychiatric:        Mood and Affect: Mood normal.      ED Treatments / Results   Labs (all labs ordered are listed, but only abnormal results are displayed) Labs Reviewed  CBC WITH DIFFERENTIAL/PLATELET - Abnormal; Notable for the following components:      Result Value   WBC 12.5 (*)    RBC 2.39 (*)    Hemoglobin 7.5 (*)    HCT 24.2 (*)    MCV 101.3 (*)    Platelets 464 (*)    Neutro Abs 11.0 (*)    Abs Immature Granulocytes 0.27 (*)    All other components within normal limits  COMPREHENSIVE METABOLIC PANEL - Abnormal; Notable for the following components:   Glucose, Bld 149 (*)    BUN 56 (*)    Calcium 8.3 (*)    Total Protein 5.6 (*)    Albumin 2.6 (*)    All other components within normal limits  LACTIC ACID, PLASMA - Abnormal; Notable for the following components:   Lactic Acid, Venous 3.3 (*)    All other components within normal limits  TSH - Abnormal; Notable for the following components:   TSH 6.503 (*)    All other components within normal limits  VALPROIC ACID LEVEL - Abnormal; Notable for the following components:   Valproic Acid Lvl 26 (*)    All other components within normal limits  CBG MONITORING, ED - Abnormal; Notable for the following components:   Glucose-Capillary 137 (*)    All other components within normal limits  POC OCCULT BLOOD, ED - Abnormal; Notable for the following components:   Fecal Occult Bld POSITIVE (*)    All other components within normal limits  POC OCCULT BLOOD, ED - Abnormal; Notable for the following components:   Fecal Occult Bld POSITIVE (*)    All other components within normal limits  URINE CULTURE  LIPASE, BLOOD  PROTIME-INR  MAGNESIUM  AMMONIA  LACTIC ACID, PLASMA  URINALYSIS, ROUTINE W REFLEX MICROSCOPIC  I-STAT TROPONIN, ED  I-STAT TROPONIN, ED  TYPE AND SCREEN  PREPARE RBC (CROSSMATCH)  ABO/RH    EKG EKG Interpretation  Date/Time:  Wednesday August 23 2018 11:10:24 EST Ventricular Rate:  69 PR Interval:    QRS  Duration: 174 QT Interval:  455 QTC Calculation: 488 R  Axis:   -73 Text Interpretation:  Sinus rhythm Right bundle branch block Artifact in lead(s) III V2 and baseline wander in lead(s) V2 When comapred to proir, no significant changes seen.  No STEMI Confirmed by Antony Blackbird [phone]) on 08/23/2018 11:18:26 AM Also confirmed by Antony Blackbird [phone]), editor Philomena Doheny [phone])  on 08/23/2018 2:17:51 PM   Radiology Ct Head Wo Contrast  Result Date: 08/23/2018 CLINICAL DATA:  Unresponsive episode with altered mental status and confusion. EXAM: CT HEAD WITHOUT CONTRAST TECHNIQUE: Contiguous axial images were obtained from the base of the skull through the vertex without intravenous contrast. COMPARISON:  February 21, 2018. FINDINGS: Brain: Moderate diffuse atrophy is stable. There is no intracranial mass, hemorrhage, extra-axial fluid collection, or midline shift. There is small vessel disease in the centra semiovale bilaterally, stable. Small vessel disease is noted in the the right lentiform nucleus and in portions of each internal capsule anteriorly. No acute infarct is evident. Vascular: No hyperdense vessel. There is calcification in each carotid siphon region. Skull: The bony calvarium appears intact. Sinuses/Orbits: There is mucosal thickening in several ethmoid air cells. Visualized paranasal sinuses elsewhere clear. Orbits appear symmetric bilaterally. Patient appears to have undergone cataract removal on the left. Other: Mastoid air cells are clear. There is debris in each external auditory canal. IMPRESSION: Stable atrophy with supratentorial small vessel disease. No acute infarct evident. No mass or hemorrhage. There are foci of arterial vascular calcification. There is mucosal thickening in several ethmoid air cells. There is probable cerumen in each external auditory canal. Electronically Signed   By: Lowella Grip III M.D.   On: 08/23/2018 12:27   Dg Chest Portable 1 View  Result Date: 08/23/2018 CLINICAL DATA:  Syncope and GI bleed EXAM:  PORTABLE CHEST 1 VIEW COMPARISON:  08/21/2017 FINDINGS: Unchanged cardiomediastinal contours with calcific aortic atherosclerosis. Clear lungs. No pleural effusion or pneumothorax. IMPRESSION: No active disease. Electronically Signed   By: Ulyses Jarred M.D.   On: 08/23/2018 13:52    Procedures Procedures (including critical care time)  CRITICAL CARE Performed by: Gwenyth Allegra Sheridan Hew Total critical care time: 35 minutes Critical care time was exclusive of separately billable procedures and treating other patients. Critical care was necessary to treat or prevent imminent or life-threatening deterioration. Critical care was time spent personally by me on the following activities: development of treatment plan with patient and/or surrogate as well as nursing, discussions with consultants, evaluation of patient's response to treatment, examination of patient, obtaining history from patient or surrogate, ordering and performing treatments and interventions, ordering and review of laboratory studies, ordering and review of radiographic studies, pulse oximetry and re-evaluation of patient's condition.   Medications Ordered in ED Medications  sodium chloride 0.9 % bolus 1,000 mL (has no administration in time range)  0.9 %  sodium chloride infusion (Manually program via Guardrails IV Fluids) (has no administration in time range)  sodium chloride 0.9 % bolus 1,000 mL (0 mLs Intravenous Stopped 08/23/18 1340)     Initial Impression / Assessment and Plan / ED Course  I have reviewed the triage vital signs and the nursing notes.  Pertinent labs & imaging results that were available during my care of the patient were reviewed by me and considered in my medical decision making (see chart for details).     Pamela Callahan is a 85 y.o. female with a past medical history significant for seizures on Depakote, hyperlipidemia, hypertension, COPD  reportedly on home oxygen, severe dementia, prior  diverticulitis, GERD, osteopenia, anxiety, depression, and known right bundle branch block who presents with dark stools for 3 days and unresponsive episode today concerning for seizure.  According to EMS report to nursing, patient has had dark tarry stools for the last 3 days, unknown history of GI bleed.  Patient had an episode today of unresponsiveness and arm shaking with a subsequent postictal period concerning for seizure.  On arrival, there are no family or other providers with her but she reports she has had a seizure in the past.  She reports that she is feeling fatigued but otherwise feeling fine.  She does have dementia and is confused.  She denies any fevers, chills, congestion, chest pain, or shortness of breath.  Initial oxygen saturations were in the 80s on room air.  Patient says she is normally on oxygen and she was placed on 2 L with improvement.  Patient denies recent medication changes or trauma.  She does not member the seizure.  On exam, no evidence of head trauma is seen.  Patient moving all extremities.  Normal sensation and strength in all extremities.  Patient has a resting tremor.  Patient had dark tarry stool on rectal exam, fecal occult test was sent.  Patient's breath sounds were slightly coarse bilaterally.  Chest and back were nontender.  Patient had some arm bruising but no tenderness on exam.  Initial blood pressure was 80 systolic.  Given the dark stools I am concerned for GI bleed.  With the hypotension, patient will have a type and screen and blood works and immediately.  Fecal occult was checked.  With the soft blood pressures and the unresponsive episode, patient may have had either seizure versus syncopal episode for hypotension.  Will have work-up look for occult infection.  She denies any headache neck pain or neck stiffness.  No focal neurologic deficits on my initial exam however gait was deferred due to fatigue.  Anticipate admission given hypotension and  unresponsive episode.  Anticipate reassessment after work-up.  Patient's laboratory testing showed a drop in hemoglobin to 7.5 from 12 previously.  Mild leukocytosis.  Troponin negative.  Lactic acid slightly elevated however this may be due to possible seizure episode.  Metabolic panel showed normal kidney and liver function.  Electrolytes otherwise reassuring.  Chest x-ray shows no pneumonia and CT head showed no acute abnormality.  Fecal occult test was positive consistent with a GI bleed causing her anemia.  Unclear if it was a seizure or syncopal episode however likely it was due to symptomatic anemia.  GI was called who will see patient.  Patient was admitted to hospitalist service.  Patient had blood ordered and will be admitted for further management.  Final Clinical Impressions(s) / ED Diagnoses   Final diagnoses:  Symptomatic anemia  Gastrointestinal hemorrhage, unspecified gastrointestinal hemorrhage type    ED Discharge Orders    None     Clinical Impression: 1. Symptomatic anemia   2. Gastrointestinal hemorrhage, unspecified gastrointestinal hemorrhage type     Disposition: Admit  This note was prepared with assistance of Dragon voice recognition software. Occasional wrong-word or sound-a-like substitutions may have occurred due to the inherent limitations of voice recognition software.     Platon Arocho, Gwenyth Allegra, MD 08/23/18 [phone]

## 2018-08-23 NOTE — ED Triage Notes (Addendum)
Pt in from SNF via [facility], per report pt had unresponsive episode while having BM, there was L arm twitching during that time, per NP at facility pt was not as alert after the episode with speculation of postictal state, baseline alert to person, pt answers simple questions, pt has #20 L forearm, NSR

## 2018-08-23 NOTE — ED Notes (Signed)
No room assigned yet pt alert but not talking that you can understand

## 2018-08-23 NOTE — ED Notes (Signed)
Pt fighting when anything is attempted to be done

## 2018-08-23 NOTE — Consult Note (Signed)
UNASSIGNED PATIENT Reason for Consult: Blood in stool. Referring Physician: THP.  Pamela Callahan is an 85 y.o. female.  HPI: Pamela Callahan is a 85 year old white female with multiple medical problems listed below with a history of advanced dementia wording side to depression and seizure disorder. She was sent to the hospital from the nursing home because she had 3 days history of dark tarry stools. Patient is unable to give much details on history and can only respond yes or no. I have reviewed her chart and spoken to the family in great detail. Patient is DNR/DNI.  Past Medical History:  Diagnosis Date  . Allergy   . Anxiety   . Depression   . Diverticulitis   . GERD (gastroesophageal reflux disease)   . Hyperlipidemia   . Hypertension   . Low back pain   . Macular degeneration of left eye   . Osteopenia   . PAC (premature atrial contraction)    Past Surgical History:  Procedure Laterality Date  . BACK SURGERY    . CHOLECYSTECTOMY  2006  . TOTAL HIP ARTHROPLASTY  2008   Right   Family History  Problem Relation Age of Onset  . Hypertension Mother   . Hypertension Other     Social History:  reports that she has never smoked. She has never used smokeless tobacco. She reports that she does not drink alcohol or use drugs.  Allergies:  Allergies  Allergen Reactions  . Latex Rash    "Allergic," per MAR  . Ace Inhibitors Cough    "Allergic," per MAR  . Penicillins Other (See Comments)    "Allergic," per [facility] Did it involve swelling of the face/tongue/throat, SOB, or low BP? Unk Did it involve sudden or severe rash/hives, skin peeling, or any reaction on the inside of your mouth or nose? Unk Did you need to seek medical attention at a hospital or doctor's office? Unk When did it last happen? Unk If all above answers are "NO", may proceed with cephalosporin use.   . Yellow Dye Other (See Comments)    "Allergic," per MAR   Medications: I have reviewed the  patient's current medications.  Results for orders placed or performed during the hospital encounter of 08/23/18 (from the past 48 hour(s))  TSH     Status: Abnormal   Collection Time: 08/23/18 11:34 AM  Result Value Ref Range   TSH 6.503 (H) 0.350 - 4.500 uIU/mL    Comment: Performed by a 3rd Generation assay with a functional sensitivity of <=0.01 uIU/mL. Performed at [facility], [place] [address], [place], [place] [address]   POC occult blood, ED     Status: Abnormal   Collection Time: 08/23/18 11:38 AM  Result Value Ref Range   Fecal Occult Bld POSITIVE (A) NEGATIVE  POC occult blood, ED     Status: Abnormal   Collection Time: 08/23/18 11:40 AM  Result Value Ref Range   Fecal Occult Bld POSITIVE (A) NEGATIVE  CBG monitoring, ED     Status: Abnormal   Collection Time: 08/23/18 11:41 AM  Result Value Ref Range   Glucose-Capillary 137 (H) 70 - 99 mg/dL   Comment 1 Notify RN    Comment 2 Document in Chart   Lactic acid, plasma     Status: Abnormal   Collection Time: 08/23/18 11:48 AM  Result Value Ref Range   Lactic Acid, Venous 3.3 (HH) 0.5 - 1.9 mmol/L    Comment: CRITICAL RESULT CALLED TO, READ  BACK BY AND VERIFIED WITH: Wilhemina Cash RN [id number] BY A BENNETT Performed at [facility], [place] [address] [place], [place] [address]   Ammonia     Status: None   Collection Time: 08/23/18 11:48 AM  Result Value Ref Range   Ammonia 9 9 - 35 umol/L    Comment: Performed at [facility], [place] [address] [place], [place] [address]  CBC with Differential     Status: Abnormal   Collection Time: 08/23/18 11:50 AM  Result Value Ref Range   WBC 12.5 (H) 4.0 - 10.5 K/uL   RBC 2.39 (L) 3.87 - 5.11 MIL/uL   Hemoglobin 7.5 (L) 12.0 - 15.0 g/dL   HCT 24.2 (L) 36.0 - 46.0 %   MCV 101.3 (H) 80.0 - 100.0 fL   MCH 31.4 26.0 - 34.0 pg   MCHC 31.0 30.0 - 36.0 g/dL   RDW 14.6 11.5 - 15.5 %   Platelets 464 (H) 150 - 400 K/uL   nRBC 0.0 0.0 - 0.2 %   Neutrophils Relative %  89 %   Neutro Abs 11.0 (H) 1.7 - 7.7 K/uL   Lymphocytes Relative 5 %   Lymphs Abs 0.7 0.7 - 4.0 K/uL   Monocytes Relative 4 %   Monocytes Absolute 0.5 0.1 - 1.0 K/uL   Eosinophils Relative 0 %   Eosinophils Absolute 0.0 0.0 - 0.5 K/uL   Basophils Relative 0 %   Basophils Absolute 0.0 0.0 - 0.1 K/uL   Immature Granulocytes 2 %   Abs Immature Granulocytes 0.27 (H) 0.00 - 0.07 K/uL    Comment: Performed at [facility] [address] [place], [place] [address]  Comprehensive metabolic panel     Status: Abnormal   Collection Time: 08/23/18 11:50 AM  Result Value Ref Range   Sodium 136 135 - 145 mmol/L   Potassium 4.4 3.5 - 5.1 mmol/L   Chloride 100 98 - 111 mmol/L   CO2 23 22 - 32 mmol/L   Glucose, Bld 149 (H) 70 - 99 mg/dL   BUN 56 (H) 8 - 23 mg/dL   Creatinine, Ser 0.81 0.44 - 1.00 mg/dL   Calcium 8.3 (L) 8.9 - 10.3 mg/dL   Total Protein 5.6 (L) 6.5 - 8.1 g/dL   Albumin 2.6 (L) 3.5 - 5.0 g/dL   AST 18 15 - 41 U/L   ALT 18 0 - 44 U/L   Alkaline Phosphatase 81 38 - 126 U/L   Total Bilirubin 0.6 0.3 - 1.2 mg/dL   GFR calc non Af Amer >60 >60 mL/min   GFR calc Af Amer >60 >60 mL/min   Anion gap 13 5 - 15    Comment: Performed at [facility] [address] [place], [place] [address]  Lipase, blood     Status: None   Collection Time: 08/23/18 11:50 AM  Result Value Ref Range   Lipase 33 11 - 51 U/L    Comment: Performed at [facility] [address], [place], [place] [address]  Protime-INR     Status: None   Collection Time: 08/23/18 11:50 AM  Result Value Ref Range   Prothrombin Time 14.6 11.4 - 15.2 seconds   INR 1.15     Comment: Performed at [facility] [address] [place], [place] [address]  Magnesium     Status: None   Collection Time: 08/23/18 11:50 AM  Result Value Ref Range   Magnesium  2.0 1.7 - 2.4 mg/dL    Comment: Performed at [facility], [place] [address] [place], [place] [address]  Valproic acid level     Status:  Abnormal   Collection Time: 08/23/18 11:50 AM  Result Value Ref Range   Valproic Acid Lvl 26 (L) 50.0 - 100.0 ug/mL    Comment: Performed at [facility] [address] [place], [place] [address]  Type and screen [place]     Status: None (Preliminary result)   Collection Time: 08/23/18 11:50 AM  Result Value Ref Range   ABO/RH(D) O POS    Antibody Screen NEG    Sample Expiration 08/26/2018    Unit Number [id number]    Blood Component Type RED CELLS,LR    Unit division 00    Status of Unit      ISSUED Performed at [facility], [place] [address], [place], [place] [address]    Transfusion Status OK TO TRANSFUSE    Crossmatch Result Compatible   ABO/Rh     Status: None   Collection Time: 08/23/18 11:50 AM  Result Value Ref Range   ABO/RH(D) O POS   I-stat troponin, ED     Status: None   Collection Time: 08/23/18 12:00 PM  Result Value Ref Range   Troponin i, poc 0.00 0.00 - 0.08 ng/mL   Comment 3            Comment: Due to the release kinetics of cTnI, a negative result within the first hours of the onset of symptoms does not rule out myocardial infarction with certainty. If myocardial infarction is still suspected, repeat the test at appropriate intervals.   Prepare RBC     Status: None   Collection Time: 08/23/18 12:38 PM  Result Value Ref Range   Order Confirmation ORDER PROCESSED BY BLOOD BANK    Ct Head Wo Contrast  Result Date: 08/23/2018 CLINICAL DATA:  Unresponsive episode with altered mental status and confusion. EXAM: CT HEAD WITHOUT CONTRAST TECHNIQUE: Contiguous axial images were obtained from the base of the skull through the vertex without intravenous contrast. COMPARISON:  February 21, 2018. FINDINGS: Brain: Moderate diffuse atrophy is stable. There is no intracranial mass, hemorrhage, extra-axial fluid collection, or midline shift. There is small vessel disease in the centra semiovale bilaterally, stable. Small vessel disease is  noted in the the right lentiform nucleus and in portions of each internal capsule anteriorly. No acute infarct is evident. Vascular: No hyperdense vessel. There is calcification in each carotid siphon region. Skull: The bony calvarium appears intact. Sinuses/Orbits: There is mucosal thickening in several ethmoid air cells. Visualized paranasal sinuses elsewhere clear. Orbits appear symmetric bilaterally. Patient appears to have undergone cataract removal on the left. Other: Mastoid air cells are clear. There is debris in each external auditory canal. IMPRESSION: Stable atrophy with supratentorial small vessel disease. No acute infarct evident. No mass or hemorrhage. There are foci of arterial vascular calcification. There is mucosal thickening in several ethmoid air cells. There is probable cerumen in each external auditory canal. Electronically Signed   By: Lowella Grip III M.D.   On: 08/23/2018 12:27   Dg Chest Portable 1 View  Result Date: 08/23/2018 CLINICAL DATA:  Syncope and GI bleed EXAM: PORTABLE CHEST 1 VIEW COMPARISON:  08/21/2017 FINDINGS: Unchanged cardiomediastinal contours with calcific aortic atherosclerosis. Clear lungs. No pleural effusion or pneumothorax. IMPRESSION: No active disease. Electronically Signed   By: Cletus Gash.D.  On: 08/23/2018 13:52   Review of Systems  Reason unable to perform ROS: non-verbal patient.   Blood pressure 125/72, pulse 85, temperature 98.9 F (37.2 C), temperature source Oral, resp. rate 18, SpO2 96 %. Physical Exam  Constitutional: Vital signs are normal.  confused elderly white  HENT:  Head: Normocephalic and atraumatic.  Neck: Neck supple.  Cardiovascular: Normal rate and regular rhythm.  Respiratory: Effort normal and breath sounds normal.  GI: Soft. Bowel sounds are normal.   Assessment/Plan: Severe anemia with melena in 85 year old white female with multiple medical problems listed above. As per my discussion with the patient's  son Mr. Leslieann Whisman Callahan the family desires the patient to be palliative care does not desire any further interventional procedures at this time. I will discuss this with the [facility] hospitalist tomorrow morning so that the palliative care consult can be procured.  Vipul Cafarelli 08/23/2018, 5:51 PM

## 2018-08-24 DIAGNOSIS — D649 Anemia, unspecified: Secondary | ICD-10-CM

## 2018-08-24 DIAGNOSIS — Z7189 Other specified counseling: Secondary | ICD-10-CM

## 2018-08-24 DIAGNOSIS — K922 Gastrointestinal hemorrhage, unspecified: Secondary | ICD-10-CM

## 2018-08-24 DIAGNOSIS — Z515 Encounter for palliative care: Secondary | ICD-10-CM

## 2018-08-24 DIAGNOSIS — F0390 Unspecified dementia without behavioral disturbance: Secondary | ICD-10-CM

## 2018-08-24 LAB — CBC
HCT: 20.4 % — ABNORMAL LOW (ref 36.0–46.0)
Hemoglobin: 6.6 g/dL — CL (ref 12.0–15.0)
MCH: 30.1 pg (ref 26.0–34.0)
MCHC: 32.4 g/dL (ref 30.0–36.0)
MCV: 93.2 fL (ref 80.0–100.0)
PLATELETS: 307 10*3/uL (ref 150–400)
RBC: 2.19 MIL/uL — ABNORMAL LOW (ref 3.87–5.11)
RDW: 20.4 % — ABNORMAL HIGH (ref 11.5–15.5)
WBC: 6.9 10*3/uL (ref 4.0–10.5)
nRBC: 0 % (ref 0.0–0.2)

## 2018-08-24 LAB — RETICULOCYTES
Immature Retic Fract: 14.3 % (ref 2.3–15.9)
RBC.: 2.25 MIL/uL — ABNORMAL LOW (ref 3.87–5.11)
Retic Count, Absolute: 89.3 10*3/uL (ref 19.0–186.0)
Retic Ct Pct: 4 % — ABNORMAL HIGH (ref 0.4–3.1)

## 2018-08-24 LAB — COMPREHENSIVE METABOLIC PANEL
ALT: 13 U/L (ref 0–44)
AST: 16 U/L (ref 15–41)
Albumin: 2.2 g/dL — ABNORMAL LOW (ref 3.5–5.0)
Alkaline Phosphatase: 63 U/L (ref 38–126)
Anion gap: 6 (ref 5–15)
BUN: 30 mg/dL — ABNORMAL HIGH (ref 8–23)
CO2: 25 mmol/L (ref 22–32)
Calcium: 8 mg/dL — ABNORMAL LOW (ref 8.9–10.3)
Chloride: 107 mmol/L (ref 98–111)
Creatinine, Ser: 0.54 mg/dL (ref 0.44–1.00)
GFR calc non Af Amer: >60 mL/min (ref >60)
Glucose, Bld: 95 mg/dL (ref 70–99)
Potassium: 3.4 mmol/L — ABNORMAL LOW (ref 3.5–5.1)
SODIUM: 138 mmol/L (ref 135–145)
Total Bilirubin: 0.8 mg/dL (ref 0.3–1.2)
Total Protein: 4.6 g/dL — ABNORMAL LOW (ref 6.5–8.1)

## 2018-08-24 LAB — HEMOGLOBIN AND HEMATOCRIT, BLOOD
HCT: 28.3 % — ABNORMAL LOW (ref 36.0–46.0)
HCT: 26.7 % — ABNORMAL LOW (ref 36.0–46.0)
Hemoglobin: 9.1 g/dL — ABNORMAL LOW (ref 12.0–15.0)
Hemoglobin: 9 g/dL — ABNORMAL LOW (ref 12.0–15.0)

## 2018-08-24 LAB — PREPARE RBC (CROSSMATCH)

## 2018-08-24 LAB — MAGNESIUM: Magnesium: 1.9 mg/dL (ref 1.7–2.4)

## 2018-08-24 LAB — IRON AND TIBC
Iron: 69 ug/dL (ref 28–170)
Saturation Ratios: 32 % — ABNORMAL HIGH (ref 10.4–31.8)
TIBC: 214 ug/dL — ABNORMAL LOW (ref 250–450)
UIBC: 145 ug/dL

## 2018-08-24 LAB — FERRITIN: Ferritin: 129 ng/mL (ref 11–307)

## 2018-08-24 LAB — FOLATE: Folate: 8.7 ng/mL (ref >5.9)

## 2018-08-24 LAB — VITAMIN B12: Vitamin B-12: 533 pg/mL (ref 180–914)

## 2018-08-24 MED ORDER — SODIUM CHLORIDE 0.9% IV SOLUTION: Freq: Once | INTRAVENOUS | Status: DC

## 2018-08-24 MED ORDER — POTASSIUM CHLORIDE 10 MEQ/100ML IV SOLN
10.0000 meq | INTRAVENOUS | Status: AC
  Administered 2018-08-24 (×4): 10 meq via INTRAVENOUS
  Filled 2018-08-24 (×4): qty 100

## 2018-08-24 MED ORDER — DEXTROSE-NACL 5-0.9 % IV SOLN: INTRAVENOUS | Status: DC

## 2018-08-24 MED ORDER — ACETAMINOPHEN 325 MG PO TABS
650.0000 mg | ORAL_TABLET | Freq: Three times a day (TID) | ORAL | Status: DC
  Administered 2018-08-24 – 2018-08-25 (×4): 650 mg via ORAL
  Filled 2018-08-24 (×4): qty 2

## 2018-08-24 MED ORDER — FUROSEMIDE 10 MG/ML IJ SOLN
40.0000 mg | Freq: Once | INTRAMUSCULAR | Status: AC
  Administered 2018-08-24: 40 mg via INTRAVENOUS
  Filled 2018-08-24: qty 4

## 2018-08-24 NOTE — Progress Notes (Signed)
Notified on call about pt hgb of 6.6.

## 2018-08-24 NOTE — Progress Notes (Signed)
PT Cancellation Note  Patient Details Name: Pamela Callahan MRN: [id number] DOB: 1928-12-28   Cancelled Treatment:    Reason Eval/Treat Not Completed: Medical issues which prohibited therapy(Chart reviewed. HOlding evaluation at this time 2/2 H&H outside of appropriate/safe range for OOB activity. Hb: 6.6, HCT 20.4. Will attempt eval again at later date/time )  9:08 AM, 08/24/18 [name], PT, DPT Physical Therapist - [place] [phone] (Pager)  [phone] (Office)    [name],[name] 08/24/2018, 9:08 AM

## 2018-08-24 NOTE — Progress Notes (Signed)
PROGRESS NOTE                                                                                                                                                                                                             Patient Demographics:    Pamela Callahan, is a 85 y.o. female, DOB - [birth year], WRU:[id number]  Admit date - 08/23/2018   Admitting Physician Barb Merino, MD  Outpatient Primary MD for the patient is Plotnikov, Evie Lacks, MD  LOS - 1  Chief Complaint  Patient presents with  . Seizures       Brief Narrative  DANYLLE OUK is a 85 y.o. female with medical history significant of seizure disorder, advanced dementia, GERD, anxiety, depression, long-term nursing home resident brought from the nursing home where she was found confused and possible episode of seizure, she was also found to be anemic and there was history of black tarry stools for couple of days prior to admission at [facility].  She was admitted for seizure and anemia work-up.   Subjective:    Pamela Callahan today has, No headache, No chest pain, No abdominal pain - No Nausea, No new weakness tingling or numbness, No Cough - SOB.     Assessment  & Plan :     1.  Symptomatic acute upper GI blood loss related anemia with possible syncope versus questionable seizure breakthrough.  Seen by GI, family does not diet any invasive procedures, likely had lower upper GI bleed as BUN was elevated, she is on IV PPI, received 1 unit of packed RBC upon admission.  Continue to monitor H&H.    2.  Incomplete versus breakthrough seizure.  Plan as a #1 above for blood loss related anemia, Depakote level was subtherapeutic hence you she was placed on IV Depakote, will place on higher than home dose Depakote thereafter.  No further breakthrough seizures continue to monitor.  3.  Severe dementia.  At risk for delirium.  Minimize narcotics benzodiazepines, supportive care.  4.  Weakness and deconditioning.   Supportive care, will require SNF, will also have palliative care evaluate the patient for long-term goals of care as family does not desire any heroics and she is currently DNR.   Family Communication  : none  Code Status :  DNR  Disposition Plan  :  SNF  Consults  :  Pall Care, GI  Procedures  :      DVT Prophylaxis  :   SCDs    Lab  Results  Component Value Date   PLT 307 08/24/2018    Diet :  Diet Order            DIET SOFT Room service appropriate? Yes; Fluid consistency: Thin  Diet effective now               Inpatient Medications Scheduled Meds: . sodium chloride   Intravenous Once  . citalopram  20 mg Oral Q1200  . divalproex  250 mg Oral TID  . donepezil  10 mg Oral QHS  . LORazepam  0.25 mg Oral BID AC  . pantoprazole (PROTONIX) IV  40 mg Intravenous Q12H   Continuous Infusions: . dextrose 5 % and 0.9% NaCl 50 mL/hr at 08/24/18 0803   PRN Meds:.acetaminophen **OR** [DISCONTINUED] acetaminophen, [DISCONTINUED] ondansetron **OR** ondansetron (ZOFRAN) IV, risperiDONE  Antibiotics  :   Anti-infectives (From admission, onward)   None          Objective:   Vitals:   08/24/18 0457 08/24/18 0755 08/24/18 0814 08/24/18 1118  BP: (!) 105/57 (!) 114/59 (!) 93/54 (!) 112/55  Pulse: 67 (!) 59 63 67  Resp: 20 16 15    Temp: 99.2 F (37.3 C) 98.6 F (37 C) 98.7 F (37.1 C) 99 F (37.2 C)  TempSrc: Oral Oral Oral Oral  SpO2: 100% 100% 94% 100%    Wt Readings from Last 3 Encounters:  08/21/17 63.5 kg  02/11/17 65 kg  04/14/16 68.9 kg     Intake/Output Summary (Last 24 hours) at 08/24/2018 1329 Last data filed at 08/24/2018 1055 Gross per 24 hour  Intake 2787 ml  Output 1000 ml  Net 1787 ml     Physical Exam  Awake , No new F.N deficits, Normal affect [place].AT,PERRAL Supple Neck,No JVD, No cervical lymphadenopathy appriciated.  Symmetrical Chest wall movement, Good air movement bilaterally, CTAB RRR,No Gallops,Rubs or new Murmurs, No  Parasternal Heave +ve B.Sounds, Abd Soft, No tenderness, No organomegaly appriciated, No rebound - guarding or rigidity. No Cyanosis, Clubbing or edema, No new Rash or bruise       Data Review:    CBC Recent Labs  Lab 08/23/18 1150 08/24/18 0451  WBC 12.5* 6.9  HGB 7.5* 6.6*  HCT 24.2* 20.4*  PLT 464* 307  MCV 101.3* 93.2  MCH 31.4 30.1  MCHC 31.0 32.4  RDW 14.6 20.4*  LYMPHSABS 0.7  --   MONOABS 0.5  --   EOSABS 0.0  --   BASOSABS 0.0  --     Chemistries  Recent Labs  Lab 08/23/18 1150 08/24/18 0451 08/24/18 0733  NA 136 138  --   K 4.4 3.4*  --   CL 100 107  --   CO2 23 25  --   GLUCOSE 149* 95  --   BUN 56* 30*  --   CREATININE 0.81 0.54  --   CALCIUM 8.3* 8.0*  --   MG 2.0  --  1.9  AST 18 16  --   ALT 18 13  --   ALKPHOS 81 63  --   BILITOT 0.6 0.8  --    ------------------------------------------------------------------------------------------------------------------ No results for input(s): CHOL, HDL, LDLCALC, TRIG, CHOLHDL, LDLDIRECT in the last 72 hours.  Lab Results  Component Value Date   HGBA1C 5.1 02/11/2017   ------------------------------------------------------------------------------------------------------------------ Recent Labs    08/23/18 1134  TSH 6.503*   ------------------------------------------------------------------------------------------------------------------ Recent Labs    08/24/18 0733  VITAMINB12 533  FOLATE 8.7  FERRITIN 129  TIBC 214*  IRON  69  RETICCTPCT 4.0*    Coagulation profile Recent Labs  Lab 08/23/18 1150  INR 1.15    No results for input(s): DDIMER in the last 72 hours.  Cardiac Enzymes No results for input(s): CKMB, TROPONINI, MYOGLOBIN in the last 168 hours.  Invalid input(s): CK ------------------------------------------------------------------------------------------------------------------ No results found for: BNP  Micro Results No results found for this or any previous visit  (from the past 240 hour(s)).  Radiology Reports Ct Head Wo Contrast  Result Date: 08/23/2018 CLINICAL DATA:  Unresponsive episode with altered mental status and confusion. EXAM: CT HEAD WITHOUT CONTRAST TECHNIQUE: Contiguous axial images were obtained from the base of the skull through the vertex without intravenous contrast. COMPARISON:  February 21, 2018. FINDINGS: Brain: Moderate diffuse atrophy is stable. There is no intracranial mass, hemorrhage, extra-axial fluid collection, or midline shift. There is small vessel disease in the centra semiovale bilaterally, stable. Small vessel disease is noted in the the right lentiform nucleus and in portions of each internal capsule anteriorly. No acute infarct is evident. Vascular: No hyperdense vessel. There is calcification in each carotid siphon region. Skull: The bony calvarium appears intact. Sinuses/Orbits: There is mucosal thickening in several ethmoid air cells. Visualized paranasal sinuses elsewhere clear. Orbits appear symmetric bilaterally. Patient appears to have undergone cataract removal on the left. Other: Mastoid air cells are clear. There is debris in each external auditory canal. IMPRESSION: Stable atrophy with supratentorial small vessel disease. No acute infarct evident. No mass or hemorrhage. There are foci of arterial vascular calcification. There is mucosal thickening in several ethmoid air cells. There is probable cerumen in each external auditory canal. Electronically Signed   By: Lowella Grip III M.D.   On: 08/23/2018 12:27   Dg Chest Portable 1 View  Result Date: 08/23/2018 CLINICAL DATA:  Syncope and GI bleed EXAM: PORTABLE CHEST 1 VIEW COMPARISON:  08/21/2017 FINDINGS: Unchanged cardiomediastinal contours with calcific aortic atherosclerosis. Clear lungs. No pleural effusion or pneumothorax. IMPRESSION: No active disease. Electronically Signed   By: Ulyses Jarred M.D.   On: 08/23/2018 13:52   Dg Shoulder Left  Result Date:  08/06/2018 CLINICAL DATA:  Un witnessed fall. EXAM: LEFT SHOULDER - 2+ VIEW COMPARISON:  None. FINDINGS: No fracture or dislocation.  No bone lesion. Narrowed glenohumeral joint. Marginal spurring from the inferior humeral head. Femur head lies just below the acromion consistent with a chronic full-thickness rotator cuff tear. Bones are demineralized. Soft tissues are unremarkable. IMPRESSION: 1. No fracture or dislocation. 2. Chronic full-thickness rotator cuff tear. 3. Glenohumeral joint arthropathic changes. Electronically Signed   By: Lajean Manes M.D.   On: 08/06/2018 17:27    Time Spent in minutes  30   Lala Lund M.D on 08/24/2018 at 1:29 PM  To page go to [email or web address] - password [email or web address]

## 2018-08-24 NOTE — Evaluation (Signed)
Clinical/Bedside Swallow Evaluation Patient Details  Name: Pamela Callahan MRN: [id number] Date of Birth: 1929/06/03  Today's Date: 08/24/2018 Time: SLP Start Time (ACUTE ONLY): 9735 SLP Stop Time (ACUTE ONLY): 1013 SLP Time Calculation (min) (ACUTE ONLY): 15 min  Past Medical History:  Past Medical History:  Diagnosis Date  . Allergy   . Anxiety   . Depression   . Diverticulitis   . GERD (gastroesophageal reflux disease)   . Hyperlipidemia   . Hypertension   . Low back pain   . Macular degeneration of left eye   . Osteopenia   . PAC (premature atrial contraction)    Past Surgical History:  Past Surgical History:  Procedure Laterality Date  . BACK SURGERY    . CHOLECYSTECTOMY  2006  . TOTAL HIP ARTHROPLASTY  2008   Right   HPI:  Pt is an 85 y.o. female with history of advanced dementia, seizures, COPD on home oxygen, GERD, diverticulitis, HTN, macular degeneration, who presented from nursing home with 3 days of dark tarry stools and unresponsive episode concerning for seizure. Per MD notes concern for GI bleed; palliative consult pending.   Assessment / Plan / Recommendation Clinical Impression   Patient appears to have normal oropharyngeal swallowing function. She is alert, follows some basic commands with cues, though not all given advanced dementia. Oriented to self only. With set up and min assist for holding spoon/cup, pt is able to self-feed thin liquids via straw, puree and regular solids. Mastication appears adequate, and pt actively sweeps her tongue to clear all solids from her mouth. Swallow appears timely with adequate airway protection. When able from GI standpoint, recommend regular diet, thin liquids, meds whole with liquid (may crush in puree if pt has difficulty or holds pills), she will need set up and likely some min assistance for feeding due to dementia; finger foods likely most appropriate. MD to advance diet as appropriate. SLP to s/o. D/w RN.   SLP  Visit Diagnosis: Dysphagia, unspecified (R13.10)    Aspiration Risk  Mild aspiration risk    Diet Recommendation Other (Comment);Regular;Thin liquid(may advance to regular/thin liquids when able per MD)   Liquid Administration via: Cup;Straw Medication Administration: Whole meds with liquid(crush in puree if holding/pocketing) Supervision: Staff to assist with self feeding Compensations: Slow rate;Small sips/bites Postural Changes: Seated upright at 90 degrees    Other  Recommendations Oral Care Recommendations: Oral care BID   Follow up Recommendations Skilled Nursing facility      Frequency and Duration            Prognosis Prognosis for Safe Diet Advancement: Fair(? GI prognosis)      Swallow Study   General Date of Onset: 08/23/18 HPI: Pt is an 85 y.o. female with history of advanced dementia, seizures, COPD on home oxygen, GERD, diverticulitis, HTN, macular degeneration, who presented from nursing home with 3 days of dark tarry stools and unresponsive episode concerning for seizure. Per MD notes concern for GI bleed; palliative consult pending. Type of Study: Bedside Swallow Evaluation Previous Swallow Assessment: none in chart Diet Prior to this Study: NPO Temperature Spikes Noted: No Respiratory Status: Room air History of Recent Intubation: No Behavior/Cognition: Alert;Confused Oral Cavity Assessment: Within Functional Limits Oral Care Completed by SLP: No Oral Cavity - Dentition: Adequate natural dentition Vision: Functional for self-feeding Self-Feeding Abilities: Able to feed self;Needs set up Patient Positioning: Upright in bed Baseline Vocal Quality: Normal Volitional Cough: Cognitively unable to elicit Volitional Swallow: Unable to elicit  Oral/Motor/Sensory Function Overall Oral Motor/Sensory Function: Within functional limits(no focal deficits apparent)   Ice Chips Ice chips: Within functional limits Presentation: Spoon   Thin Liquid Thin Liquid:  Within functional limits Presentation: Self Fed;Straw    Nectar Thick Nectar Thick Liquid: Not tested   Honey Thick Honey Thick Liquid: Not tested   Puree Puree: Within functional limits Presentation: Self Fed;Spoon(initial set-up assist and for holding spoon)   Solid     Solid: Within functional limits Presentation: Self Fed(by hand)     Deneise Lever, [place], [place] Pathologist Acute Rehabilitation Services Pager: [phone] Office: [phone]  Aliene Altes 08/24/2018,10:19 AM

## 2018-08-24 NOTE — Consult Note (Signed)
.                                                                                    Consultation Note Date: 08/24/2018   Patient Name: Pamela Callahan  DOB: July 25, 1929  MRN: [id number]  Age / Sex: 85 y.o., female  PCP: Plotnikov, Evie Lacks, MD Referring Physician: Thurnell Lose, MD  Reason for Consultation: Establishing goals of care  HPI/Patient Profile: 85 y.o. female  with past medical history of advanced dementia, depression, GERD, diverticulitis, low back pain, osteopenia, admitted on 08/23/2018 from [facility].  Patient was found down having suffered possible seizure, work-up revealed severe anemia Hgb 6.6, was reported to have history of black tarry stools for several days prior to admission.  Given history of advanced dementia palliative medicine consulted for goals of care.  Clinical Assessment and Goals of Care:  I have reviewed medical records including EPIC notes, labs and imaging, assessed the patient and then met at the bedside along with patient's son (who is also HCPOA) and daughter in law- to discuss diagnosis prognosis, GOC, EOL wishes, disposition and options.  Patient in bed. Cannot tell me her name or name of anyone in the room. All answers to my questions are unintelligible.  I introduced Palliative Medicine as specialized medical care for people living with serious illness. It focuses on providing relief from the symptoms and stress of a serious illness. The goal is to improve quality of life for both the patient and the family.  We discussed a brief life review of the patient.  Her spouse died 3 years ago.  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  As far as functional and nutritional status -there has been significant decline in her function, cognition, and nutrition, especially over the past several months according to her son.  He  has noted that she prefers to spend much of her day in bed and is sleeping more than she is awake.  He reports that staff at the assisted living have to encourage her to get up.  She requires frequent cueing to eat, some days can feed herself and somedays cannot.  She is total assistance for all ADLs and is incontinent of stool and urine.  She is wheelchair-bound.  We discussed her current illness and what it means in the larger context of her on-going co-morbidities.  Natural disease trajectory and expectations at EOL were discussed.  We discussed her dementia, and her anemia possibly from GI bleed.  Her son has stated that she would not want invasive diagnostic or other procedures for possible GI bleed and if there were other some other life-threatening situation she would want comfort measures only.  Her son notes that she does not have a history of seizure disorder and states she has not had a seizure before.  She has history of hip pain from previous hip surgeries.  She is also known to get very anxious and has previously had long-term use of tranxine.  Per report she seemed to be very  agitated last night in the ER.  He notes that at the nursing facility they apply a topical lorazepam gel to assist with agitation and anxiety.  I attempted to elicit values and goals of care important to the patient.  She valued her cognition and intellect greatly.  To have lost this has caused her despair.  Her son and daughter-in-law note that she does not have a good quality of life.  The difference between aggressive medical intervention and comfort care was explained and considered in light of the patient's goals of care. Advanced directives, concepts specific to code status, artifical feeding and hydration, and rehospitalization were considered and discussed. A MOST form was completed-choices made were DNR, comfort measures only, antibiotic use or limitation to be determined as needed (give antibiotic only for  comfort for example in the case of a UTI), no artificial feeding, no IV fluids, do not return to hospital unless comfort measures cannot be met in current location.  Hospice and Palliative Care services outpatient were explained and offered.  Family would like patient to receive hospice services when she is discharged back to her nursing facility.  Questions and concerns were addressed.  Hard Choices booklet left for review. The family was encouraged to call with questions or concerns.   Primary Decision Maker HCPOA- Woody Seller- son    SUMMARY OF RECOMMENDATIONS   -DNR -No invasive diagnostics or procedures -D/C home with Hospice- we discussed if active bleeding continues or Hgb drops post transfusion- patient may need residential hospice- recommend check CBC in the morning for prognosis -Minimize medications -Will schedule tylenol for arthritis pain  Code Status/Advance Care Planning:  DNR  Palliative Prophylaxis:   Delirium Protocol and Frequent Pain Assessment  Additional Recommendations (Limitations, Scope, Preferences):  Avoid Hospitalization, Minimize Medications, No Artificial Feeding, No IV Fluids and No Surgical Procedures  Prognosis:    < 6 months due to advanced demential with decline in cognition, nutrition, function, GI bleed without plan for further treatment or workup- preference for comfort measures only once patient is discharged  Discharge Planning: [place] with Hospice  Primary Diagnoses: Present on Admission: . Syncope and collapse . Iron deficiency anemia due to chronic blood loss . Dyslipidemia . Anxiety state . Essential hypertension . Severe dementia ([place]) . Lactic acidosis   I have reviewed the medical record, interviewed the patient and family, and examined the patient. The following aspects are pertinent.  Past Medical History:  Diagnosis Date  . Allergy   . Anxiety   . Depression   . Diverticulitis   . GERD  (gastroesophageal reflux disease)   . Hyperlipidemia   . Hypertension   . Low back pain   . Macular degeneration of left eye   . Osteopenia   . PAC (premature atrial contraction)    Social History   Socioeconomic History  . Marital status: Widowed    Spouse name: Not on file  . Number of children: Not on file  . Years of education: Not on file  . Highest education level: Not on file  Occupational History  . Occupation: Retired PhD  . Occupation: Works at home    Comment: garden  Social Needs  . Financial resource strain: Not on file  . Food insecurity:    Worry: Not on file    Inability: Not on file  . Transportation needs:    Medical: Not on file    Non-medical: Not on file  Tobacco Use  . Smoking status: Never Smoker  .  Smokeless tobacco: Never Used  Substance and Sexual Activity  . Alcohol use: No  . Drug use: No  . Sexual activity: Not on file  Lifestyle  . Physical activity:    Days per week: Not on file    Minutes per session: Not on file  . Stress: Not on file  Relationships  . Social connections:    Talks on phone: Not on file    Gets together: Not on file    Attends religious service: Not on file    Active member of club or organization: Not on file    Attends meetings of clubs or organizations: Not on file    Relationship status: Not on file  Other Topics Concern  . Not on file  Social History Narrative  . Not on file   Family History  Problem Relation Age of Onset  . Hypertension Mother   . Hypertension Other    Scheduled Meds: . citalopram  20 mg Oral Q1200  . divalproex  250 mg Oral TID  . donepezil  10 mg Oral QHS  . furosemide  40 mg Intravenous Once  . LORazepam  0.25 mg Oral BID AC  . pantoprazole (PROTONIX) IV  40 mg Intravenous Q12H   Continuous Infusions: PRN Meds:.acetaminophen **OR** [DISCONTINUED] acetaminophen, [DISCONTINUED] ondansetron **OR** ondansetron (ZOFRAN) IV, risperiDONE Medications Prior to Admission:  Prior to  Admission medications   Medication Sig Start Date End Date Taking? Authorizing Provider  acetaminophen (TYLENOL) 500 MG tablet Take 1,000 mg by mouth 3 (three) times daily.   Yes [provider]  aspirin 81 MG chewable tablet Chew 81 mg by mouth daily.   Yes [provider]  celecoxib (CELEBREX) 100 MG capsule Take 100 mg by mouth 2 (two) times daily.   Yes [provider]  citalopram (CELEXA) 10 MG tablet Take 20 mg by mouth daily at 12 noon.   Yes [provider]  divalproex (DEPAKOTE) 250 MG DR tablet Take 250 mg by mouth See admin instructions. Take 250 mg by mouth three times a day- lunch, supper, and bedtime   Yes [provider]  donepezil (ARICEPT) 10 MG tablet Take 1 tablet (10 mg total) by mouth at bedtime. Patient taking differently: Take 20 mg by mouth at bedtime.  04/14/16  Yes Plotnikov, Evie Lacks, MD  ferrous sulfate 325 (65 FE) MG tablet Take 325 mg by mouth daily with breakfast.   Yes [provider]  fexofenadine (ALLEGRA) 180 MG tablet Take 1 tablet (180 mg total) by mouth daily. 12/21/11  Yes Plotnikov, Evie Lacks, MD  fluticasone (FLONASE) 50 MCG/ACT nasal spray Place 1 spray into both nostrils daily. As needed Patient taking differently: Place 1 spray into both nostrils daily.  09/11/13  Yes Plotnikov, Evie Lacks, MD  hydrochlorothiazide (HYDRODIURIL) 25 MG tablet Take 25 mg by mouth daily.   Yes [provider]  loperamide (IMODIUM A-D) 2 MG tablet Take 2 mg by mouth 4 (four) times daily as needed for diarrhea or loose stools.   Yes [provider]  LORazepam (ATIVAN) 0.5 MG tablet Take 0.25 mg by mouth See admin instructions. Take 0.25 mg by mouth two times a day- at lunch and supper   Yes [provider]  magnesium gluconate (MAGONATE) 500 MG tablet Take 500 mg by mouth daily.   Yes [provider]  Multiple Vitamins-Minerals (PRESERVISION/LUTEIN) CAPS Take 1 capsule by mouth 2 (two) times  daily.    Yes [provider]  neomycin-bacitracin-polymyxin (NEOSPORIN)  [phone] ointment Apply 1 application topically as needed (to any wounds until healed- apply after cleansing, then cover with a non-stick dressing).   Yes [provider]  NONFORMULARY OR COMPOUNDED ITEM Apply 1 application topically See admin instructions. Lorazepam gel 2 mg/ml: Apply 1 ml to inner wrist three times a day as needed for agitation or anxiety   Yes [provider]  olmesartan (BENICAR) 20 MG tablet Take 1 tablet (20 mg total) by mouth daily. 03/26/16  Yes Plotnikov, Evie Lacks, MD  polyethylene glycol (MIRALAX / GLYCOLAX) packet Take 17 g by mouth daily as needed for mild constipation. Patient taking differently: Take 17 g by mouth daily as needed for mild constipation. Mix in juice, water, or coffee 02/12/17  Yes Dhungel, Nishant, MD  risperiDONE (RISPERDAL) 0.25 MG tablet Take 0.25 mg by mouth every 12 (twelve) hours as needed ("for behaviors," per [facility]).   Yes [provider]  vitamin B-12 (CYANOCOBALAMIN) 1000 MCG tablet Take 1 tablet (1,000 mcg total) by mouth daily. 12/25/15  Yes Plotnikov, Evie Lacks, MD   Allergies  Allergen Reactions  . Latex Rash    "Allergic," per MAR  . Ace Inhibitors Cough    "Allergic," per MAR  . Penicillins Other (See Comments)    "Allergic," per [facility] Did it involve swelling of the face/tongue/throat, SOB, or low BP? Unk Did it involve sudden or severe rash/hives, skin peeling, or any reaction on the inside of your mouth or nose? Unk Did you need to seek medical attention at a hospital or doctor's office? Unk When did it last happen? Unk If all above answers are "NO", may proceed with cephalosporin use.   . Yellow Dye Other (See Comments)    "Allergic," per MAR   Review of Systems  Unable to perform ROS: Dementia    Physical Exam Vitals signs and nursing note reviewed.  Constitutional:      Comments: Frail  Cardiovascular:     Rate  and Rhythm: Normal rate.  Pulmonary:     Effort: Pulmonary effort is normal.  Neurological:     Comments: Unintelligible speech     Vital Signs: BP (!) 112/55   Pulse 67   Temp 99 F (37.2 C) (Oral)   Resp 15   SpO2 100%  Pain Scale: 0-10   Pain Score: 0-No pain   SpO2: SpO2: 100 % O2 Device:SpO2: 100 % O2 Flow Rate: .O2 Flow Rate (L/min): 3 L/min  IO: Intake/output summary:   Intake/Output Summary (Last 24 hours) at 08/24/2018 1434 Last data filed at 08/24/2018 1055 Gross per 24 hour  Intake 1452 ml  Output 1000 ml  Net 452 ml    LBM: Last BM Date: 08/23/18 Baseline Weight:   Most recent weight:       Palliative Assessment/Data:PPS: 30%     Thank you for this consult. Palliative medicine will continue to follow and assist as needed.   Time In: 1300 Time Out: 1500 Time Total: 120 minutes Prolonged services billed; yes Greater than 50%  of this time was spent counseling and coordinating care related to the above assessment and plan.  Signed by: Mariana Kaufman, AGNP-C Palliative Medicine    Please contact Palliative Medicine Team phone at [phone] for questions and concerns.  For individual provider: See Shea Evans

## 2018-08-25 ENCOUNTER — Other Ambulatory Visit: Payer: Self-pay

## 2018-08-25 LAB — CBC
HCT: 26.6 % — ABNORMAL LOW (ref 36.0–46.0)
Hemoglobin: 8.6 g/dL — ABNORMAL LOW (ref 12.0–15.0)
MCH: 29.5 pg (ref 26.0–34.0)
MCHC: 32.3 g/dL (ref 30.0–36.0)
MCV: 91.1 fL (ref 80.0–100.0)
Platelets: 280 10*3/uL (ref 150–400)
RBC: 2.92 MIL/uL — AB (ref 3.87–5.11)
RDW: 17.9 % — ABNORMAL HIGH (ref 11.5–15.5)
WBC: 6.5 10*3/uL (ref 4.0–10.5)
nRBC: 0 % (ref 0.0–0.2)

## 2018-08-25 LAB — TYPE AND SCREEN
ABO/RH(D): O POS
Antibody Screen: NEGATIVE
Unit division: 0
Unit division: 0

## 2018-08-25 LAB — BPAM RBC
Blood Product Expiration Date: 202001282359
Blood Product Expiration Date: 202002212359
ISSUE DATE / TIME: 202001221318
ISSUE DATE / TIME: 202001230741
Unit Type and Rh: 5100
Unit Type and Rh: 5100

## 2018-08-25 LAB — BASIC METABOLIC PANEL
Anion gap: 9 (ref 5–15)
BUN: 19 mg/dL (ref 8–23)
CO2: 25 mmol/L (ref 22–32)
Calcium: 8.3 mg/dL — ABNORMAL LOW (ref 8.9–10.3)
Chloride: 102 mmol/L (ref 98–111)
Creatinine, Ser: 0.68 mg/dL (ref 0.44–1.00)
GFR calc Af Amer: >60 mL/min (ref >60)
GFR calc non Af Amer: >60 mL/min (ref >60)
Glucose, Bld: 85 mg/dL (ref 70–99)
Potassium: 3.5 mmol/L (ref 3.5–5.1)
Sodium: 136 mmol/L (ref 135–145)

## 2018-08-25 MED ORDER — PANTOPRAZOLE SODIUM 40 MG PO TBEC: 40.0000 mg | DELAYED_RELEASE_TABLET | Freq: Every day | ORAL | 0 refills | Status: DC

## 2018-08-25 MED ORDER — PANTOPRAZOLE SODIUM 40 MG PO TBEC: 40.0000 mg | DELAYED_RELEASE_TABLET | Freq: Every day | ORAL | Status: DC

## 2018-08-25 MED ORDER — ENSURE ENLIVE PO LIQD
237.0000 mL | Freq: Two times a day (BID) | ORAL | Status: DC
  Administered 2018-08-25: 237 mL via ORAL

## 2018-08-25 MED ORDER — LOPERAMIDE HCL 2 MG PO TABS: 2.0000 mg | ORAL_TABLET | Freq: Three times a day (TID) | ORAL | 0 refills | Status: AC | PRN

## 2018-08-25 MED ORDER — POLYETHYLENE GLYCOL 3350 17 G PO PACK: 17.0000 g | PACK | Freq: Every day | ORAL | 0 refills | Status: AC

## 2018-08-25 MED ORDER — DIVALPROEX SODIUM 250 MG PO DR TAB: 250.0000 mg | DELAYED_RELEASE_TABLET | Freq: Three times a day (TID) | ORAL | 0 refills | Status: DC

## 2018-08-25 NOTE — Progress Notes (Signed)
Report called to [facility] at this time.

## 2018-08-25 NOTE — Care Management Important Message (Signed)
Important Message  Patient Details  Name: Pamela Callahan MRN: [id number] Date of Birth: 1929-05-07   Medicare Important Message Given:  Yes    [name] 08/25/2018, 11:25 AM

## 2018-08-25 NOTE — Progress Notes (Signed)
Nsg Discharge Note  Admit Date:  08/23/2018 Discharge date: 08/25/2018   Pamela Callahan to be D/C'd to [facility] per MD order.  AVS completed.  Copy for facility placed in packet. Patient/caregiver able to verbalize understanding.  Discharge Medication: Allergies as of 08/25/2018      Reactions   Latex Rash   "Allergic," per MAR   Ace Inhibitors Cough   "Allergic," per MAR   Penicillins Other (See Comments)   "Allergic," per [facility] Did it involve swelling of the face/tongue/throat, SOB, or low BP? Unk Did it involve sudden or severe rash/hives, skin peeling, or any reaction on the inside of your mouth or nose? Unk Did you need to seek medical attention at a hospital or doctor's office? Unk When did it last happen? Unk If all above answers are "NO", may proceed with cephalosporin use.   Yellow Dye Other (See Comments)   "Allergic," per [facility]      Medication List    STOP taking these medications   celecoxib 100 MG capsule Commonly known as:  CELEBREX     TAKE these medications   acetaminophen 500 MG tablet Commonly known as:  TYLENOL Take 1,000 mg by mouth 3 (three) times daily.   aspirin 81 MG chewable tablet Chew 81 mg by mouth daily.   citalopram 10 MG tablet Commonly known as:  CELEXA Take 20 mg by mouth daily at 12 noon.   divalproex 250 MG DR tablet Commonly known as:  DEPAKOTE Take 1 tablet (250 mg total) by mouth 3 (three) times daily. Take 250 mg by mouth three times a day- lunch, supper, and bedtime What changed:  when to take this   donepezil 10 MG tablet Commonly known as:  ARICEPT Take 1 tablet (10 mg total) by mouth at bedtime. What changed:  how much to take   ferrous sulfate 325 (65 FE) MG tablet Take 325 mg by mouth daily with breakfast.   fexofenadine 180 MG tablet Commonly known as:  ALLEGRA Take 1 tablet (180 mg total) by mouth daily.   fluticasone 50 MCG/ACT nasal spray Commonly known as:  FLONASE Place 1 spray into both nostrils  daily. As needed What changed:  additional instructions   hydrochlorothiazide 25 MG tablet Commonly known as:  HYDRODIURIL Take 25 mg by mouth daily.   loperamide 2 MG tablet Commonly known as:  IMODIUM A-D Take 1 tablet (2 mg total) by mouth 3 (three) times daily as needed for diarrhea or loose stools. What changed:  when to take this   LORazepam 0.5 MG tablet Commonly known as:  ATIVAN Take 0.25 mg by mouth See admin instructions. Take 0.25 mg by mouth two times a day- at lunch and supper   magnesium gluconate 500 MG tablet Commonly known as:  MAGONATE Take 500 mg by mouth daily.   neomycin-bacitracin-polymyxin [phone] ointment Apply 1 application topically as needed (to any wounds until healed- apply after cleansing, then cover with a non-stick dressing).   NONFORMULARY OR COMPOUNDED ITEM Apply 1 application topically See admin instructions. Lorazepam gel 2 mg/ml: Apply 1 ml to inner wrist three times a day as needed for agitation or anxiety   olmesartan 20 MG tablet Commonly known as:  BENICAR Take 1 tablet (20 mg total) by mouth daily.   pantoprazole 40 MG tablet Commonly known as:  PROTONIX Take 1 tablet (40 mg total) by mouth daily.   polyethylene glycol packet Commonly known as:  MIRALAX / GLYCOLAX Take 17 g by mouth  daily. What changed:    when to take this  reasons to take this   PRESERVISION/LUTEIN Caps Take 1 capsule by mouth 2 (two) times daily.   risperiDONE 0.25 MG tablet Commonly known as:  RISPERDAL Take 0.25 mg by mouth every 12 (twelve) hours as needed ("for behaviors," per [facility]).   vitamin B-12 1000 MCG tablet Commonly known as:  CYANOCOBALAMIN Take 1 tablet (1,000 mcg total) by mouth daily.       Discharge Assessment: Vitals:   08/25/18 0531 08/25/18 1458  BP: 133/72 126/76  Pulse: 61 63  Resp:    Temp: 98 F (36.7 C)   SpO2: 99% 100%    D/c Instructions-Education: Discharge instructions given to patient/family with  verbalized understanding. D/c education completed with patient/family including follow up instructions, medication list, d/c activities limitations if indicated, with other d/c instructions as indicated by MD - patient able to verbalize understanding, all questions fully answered. Patient escorted via ptar and stretcher.   Hiram Comber, RN 08/25/2018 5:43 PM

## 2018-08-25 NOTE — Discharge Instructions (Signed)
Disposition.  ALF with hospice Condition.  Guarded CODE STATUS.  DNR Diet.  Soft with feeding assistance and aspiration precautions Activity.  With assistance and fall precautions Goal of care.  Comfort

## 2018-08-25 NOTE — Clinical Social Work Note (Signed)
Clinical Social Work Assessment  Patient Details  Name: Pamela Callahan MRN: [id number] Date of Birth: October 28, 1928  Date of referral:  08/25/18               Reason for consult:  Facility Placement                Permission sought to share information with:  Facility Sport and exercise psychologist, Family Supports Permission granted to share information::  Yes, Verbal Permission Granted  Name::     Malachi Carl::  ALF  Relationship::  Son  Contact Information:  [phone]  Housing/Transportation Living arrangements for the past 2 months:  [place] of Information:  Patient, Adult Children Patient Interpreter Needed:  None Criminal Activity/Legal Involvement Pertinent to Current Situation/Hospitalization:  No - Comment as needed Significant Relationships:  Adult Children Lives with:  Facility Resident Do you feel safe going back to the place where you live?  Yes Need for family participation in patient care:  Yes (Comment)  Care giving concerns:  CSW received consult for possible SNF placement at time of discharge. CSW spoke with patient and son. She resides at [facility] and will return at discharge. CSW to continue to follow and assist with discharge planning needs.   Social Worker assessment / plan:  CSW spoke with patient concerning return to ALF.  Employment status:  Retired Forensic scientist:  Medicare PT Recommendations:  [place] / Referral to community resources:     Patient/Family's Response to care:  Patient reports understanding of discharge plan and request hospice at ALF.  Patient/Family's Understanding of and Emotional Response to Diagnosis, Current Treatment, and Prognosis:  Patient/family is realistic regarding therapy needs and expressed being hopeful for ALF placement. Patient's son expressed understanding of CSW role and discharge process as well as medical condition. No questions/concerns about plan or  treatment.    Emotional Assessment Appearance:  Appears stated age Attitude/Demeanor/Rapport:  Gracious Affect (typically observed):  Accepting, Appropriate Orientation:  Oriented to Self, Oriented to Place, Oriented to  Time, Oriented to Situation Alcohol / Substance use:  Not Applicable Psych involvement (Current and /or in the community):  No (Comment)  Discharge Needs  Concerns to be addressed:  Care Coordination Readmission within the last 30 days:  No Current discharge risk:  None Barriers to Discharge:  No Barriers Identified   Benard Halsted, LCSW 08/25/2018, 12:26 PM

## 2018-08-25 NOTE — Evaluation (Signed)
Physical Therapy Evaluation Patient Details Name: Pamela Callahan MRN: [id number] DOB: 08/28/1928 Today's Date: 08/25/2018   History of Present Illness  85 yo female with dementia and semi verbal state was admitted for syncope, AMS, confusion, and leukocytosis.  Has noted a GI bleed, not currently passing blood.  Has been unable to give a prior level to PT but is expected to DC to SNF.  PMHx:  atherosclerosis, advanced dementia, PAC, macular degeneration  Clinical Impression  Pt was seen for mobility and note her struggle to comprehend PT as well as poor verbalizations.  Pt is unable to give a history so PT is unsure with limited preceding documentation on the chart what her PLOF was.  Will work on ROM and balance in sitting as well as transfers to work toward her care at [facility] as is scheduled.  Follow up with PT in her next venue to continue acute therapy as is appropriate.    Follow Up Recommendations SNF    Equipment Recommendations  None recommended by PT    Recommendations for Other Services       Precautions / Restrictions Precautions Precautions: Fall(telemetry) Precaution Comments: semi verbal Restrictions Weight Bearing Restrictions: No      Mobility  Bed Mobility Overal bed mobility: Needs Assistance Bed Mobility: Supine to Sit;Sit to Supine     Supine to sit: Max assist Sit to supine: Total assist   General bed mobility comments: Pt is not following instructions but can control sit at fair balance  Transfers Overall transfer level: Needs assistance Equipment used: 1 person hand held assist Transfers: Sit to/from Stand Sit to Stand: Total assist         General transfer comment: mod assist to partially stand but total to get up right  Ambulation/Gait             General Gait Details: non ambulatory  Stairs            Wheelchair Mobility    Modified [place] (Stroke Patients Only)       Balance Overall balance assessment: Needs  assistance Sitting-balance support: Feet supported;Bilateral upper extremity supported Sitting balance-Leahy Scale: Poor       Standing balance-Leahy Scale: Zero                               Pertinent Vitals/Pain Pain Assessment: Faces Faces Pain Scale: No hurt    Home Living Family/patient expects to be discharged to:: Skilled nursing facility                      Prior Function Level of Independence: (prior level not able to be obtained)               Hand Dominance        Extremity/Trunk Assessment   Upper Extremity Assessment Upper Extremity Assessment: Generalized weakness    Lower Extremity Assessment Lower Extremity Assessment: Generalized weakness    Cervical / Trunk Assessment Cervical / Trunk Assessment: Kyphotic  Communication   Communication: Expressive difficulties  Cognition Arousal/Alertness: Lethargic;Awake/alert Behavior During Therapy: Flat affect Overall Cognitive Status: History of cognitive impairments - at baseline                                        General Comments General comments (skin integrity, edema, etc.): sits with fair  balance with cues and prompts to get steady, cannot leave unattended    Exercises     Assessment/Plan    PT Assessment Patient needs continued PT services  PT Problem List Decreased strength;Decreased range of motion;Decreased activity tolerance;Decreased balance;Decreased mobility;Decreased coordination;Decreased cognition;Decreased knowledge of use of DME;Decreased safety awareness;Decreased knowledge of precautions;Cardiopulmonary status limiting activity       PT Treatment Interventions DME instruction;Functional mobility training;Therapeutic exercise;Therapeutic activities;Balance training;Neuromuscular re-education;Patient/family education    PT Goals (Current goals can be found in the Care Plan section)  Acute Rehab PT Goals Patient Stated Goal: none  stated PT Goal Formulation: Patient unable to participate in goal setting Time For Goal Achievement: 09/08/18 Potential to Achieve Goals: Fair    Frequency Min 2X/week   Barriers to discharge   home to SNF for follow up    Co-evaluation               AM-PAC PT "6 Clicks" Mobility  Outcome Measure Help needed turning from your back to your side while in a flat bed without using bedrails?: A Lot Help needed moving from lying on your back to sitting on the side of a flat bed without using bedrails?: A Lot Help needed moving to and from a bed to a chair (including a wheelchair)?: A Lot Help needed standing up from a chair using your arms (e.g., wheelchair or bedside chair)?: A Lot Help needed to walk in hospital room?: Total Help needed climbing 3-5 steps with a railing? : Total 6 Click Score: 10    End of Session   Activity Tolerance: Patient limited by fatigue;Other (comment)(generalized weakness) Patient left: in bed;with call bell/phone within reach;with bed alarm set Nurse Communication: Mobility status PT Visit Diagnosis: Unsteadiness on feet (R26.81);Muscle weakness (generalized) (M62.81);Difficulty in walking, not elsewhere classified (R26.2)    Time: 0737-1062 PT Time Calculation (min) (ACUTE ONLY): 31 min   Charges:   PT Evaluation $PT Eval Moderate Complexity: 1 Mod PT Treatments $Neuromuscular Re-education: 8-22 mins        Ramond Dial 08/25/2018, 12:48 PM  Mee Hives, PT MS Acute Rehab Dept. Number: [place] and [place]

## 2018-08-25 NOTE — Progress Notes (Signed)
Patient will DC to: [facility] Anticipated DC date: 08/25/18 Family notified: Son Transport by: Corey Harold   Per MD patient ready for DC to [facility]. RN, patient, patient's family, and facility notified of DC. Discharge Summary and FL2 sent to facility. RN to call report prior to discharge [phone]). DC packet on chart. Ambulance transport requested for patient.   CSW will sign off for now as social work intervention is no longer needed. Please consult [place] again if new needs arise.  Cedric Fishman, LCSW Clinical Social Worker [phone]

## 2018-08-25 NOTE — Progress Notes (Signed)
Nutrition Brief Note  Chart reviewed. Pt's goals of care are comfort care with no artifical feedings.  No nutrition interventions planned at this time.  Please consult as needed.  D/C plan for hospice.   [place], [place], [place] Pager [phone] After Hours Pager

## 2018-08-25 NOTE — Discharge Summary (Signed)
ARTIA SINGLEY FYB:[id number] DOB: May 18, 1929 DOA: 08/23/2018  PCP: Cassandria Anger, MD  Admit date: 08/23/2018  Discharge date: 08/25/2018  Admitted From: ALF   Disposition:  ALF with Hospice   Recommendations for Outpatient Follow-up:   Follow up with PCP in 1-2 weeks  PCP Please obtain BMP/CBC, 2 view CXR in 1week,  (see Discharge instructions)   PCP Please follow up on the following pending results:    Home Health: None   Equipment/Devices: None  Consultations: Pall.Care, GI Discharge Condition: Guarded   CODE STATUS: DNF   Diet Recommendation: Soft diet with feeding assistance and aspiration precautions    Chief Complaint  Patient presents with  . Seizures     Brief history of present illness from the day of admission and additional interim summary    Arma K Griffinis a 85 y.o.femalewith medical history significant ofseizure disorder, advanced dementia, GERD, anxiety, depression, long-term nursing home resident brought from the nursing home where she was found confused and possible episode of seizure, she was also found to be anemic and there was history of black tarry stools for couple of days prior to admission at [facility].  She was admitted for seizure and anemia work-up.                                                                 Hospital Course     1.  Symptomatic acute upper GI blood loss related anemia with possible syncope versus questionable seizure breakthrough.  Seen by GI, family does not diet any invasive procedures, likely had lower upper GI bleed as BUN was elevated, used 1 unit of packed RBC and IV PPI, now stable H&H, will be transition to oral PPI and discharged back to ALF.  Family does not desire any further heroics.    2.  Incomplete versus breakthrough seizure.   Plan as a #1 above for blood loss related anemia, Depakote level was subtherapeutic hence you she was placed on IV Depakote, will place on higher than home dose Depakote thereafter.  No further breakthrough seizures continue to monitor.  3.  Severe dementia.  At risk for delirium.  Minimize narcotics benzodiazepines, continue with present supportive care.  4.  Weakness and deconditioning.  Supportive care, will require SNF, will also have palliative care evaluate the patient for long-term goals of care as family does not desire any heroics and she is currently DNR.  And will be discharged to assisted living facility with hospice follow-up.  Goal of care will be comfort.  If declines full comfort care should be initiated.  Avoid future hospitalizations unless comfort becomes an issue.   5. Hypokalemia.  Replaced.  Discharge diagnosis     Principal Problem:   Syncope and collapse Active Problems:   Dyslipidemia   Anxiety state  Essential hypertension   Severe dementia (HCC)   Iron deficiency anemia due to chronic blood loss   Lactic acidosis   Seizure (HCC)   Gastrointestinal hemorrhage   Advanced care planning/counseling discussion   Goals of care, counseling/discussion   Palliative care by specialist   Symptomatic anemia    Discharge instructions    Discharge Instructions    Diet - low sodium heart healthy   Complete by:  As directed    Discharge instructions   Complete by:  As directed    Disposition.  ALF with hospice Condition.  Guarded CODE STATUS.  DNR Diet.  Soft with feeding assistance and aspiration precautions Activity.  With assistance and fall precautions Goal of care.  Comfort   Increase activity slowly   Complete by:  As directed       Discharge Medications   Allergies as of 08/25/2018      Reactions   Latex Rash   "Allergic," per MAR   Ace Inhibitors Cough   "Allergic," per MAR   Penicillins Other (See Comments)   "Allergic," per [facility] Did it  involve swelling of the face/tongue/throat, SOB, or low BP? Unk Did it involve sudden or severe rash/hives, skin peeling, or any reaction on the inside of your mouth or nose? Unk Did you need to seek medical attention at a hospital or doctor's office? Unk When did it last happen? Unk If all above answers are "NO", may proceed with cephalosporin use.   Yellow Dye Other (See Comments)   "Allergic," per [facility]      Medication List    STOP taking these medications   celecoxib 100 MG capsule Commonly known as:  CELEBREX     TAKE these medications   acetaminophen 500 MG tablet Commonly known as:  TYLENOL Take 1,000 mg by mouth 3 (three) times daily.   aspirin 81 MG chewable tablet Chew 81 mg by mouth daily.   citalopram 10 MG tablet Commonly known as:  CELEXA Take 20 mg by mouth daily at 12 noon.   divalproex 250 MG DR tablet Commonly known as:  DEPAKOTE Take 1 tablet (250 mg total) by mouth 3 (three) times daily. Take 250 mg by mouth three times a day- lunch, supper, and bedtime What changed:  when to take this   donepezil 10 MG tablet Commonly known as:  ARICEPT Take 1 tablet (10 mg total) by mouth at bedtime. What changed:  how much to take   ferrous sulfate 325 (65 FE) MG tablet Take 325 mg by mouth daily with breakfast.   fexofenadine 180 MG tablet Commonly known as:  ALLEGRA Take 1 tablet (180 mg total) by mouth daily.   fluticasone 50 MCG/ACT nasal spray Commonly known as:  FLONASE Place 1 spray into both nostrils daily. As needed What changed:  additional instructions   hydrochlorothiazide 25 MG tablet Commonly known as:  HYDRODIURIL Take 25 mg by mouth daily.   loperamide 2 MG tablet Commonly known as:  IMODIUM A-D Take 1 tablet (2 mg total) by mouth 3 (three) times daily as needed for diarrhea or loose stools. What changed:  when to take this   LORazepam 0.5 MG tablet Commonly known as:  ATIVAN Take 0.25 mg by mouth See admin instructions. Take 0.25 mg by  mouth two times a day- at lunch and supper   magnesium gluconate 500 MG tablet Commonly known as:  MAGONATE Take 500 mg by mouth daily.   neomycin-bacitracin-polymyxin [phone] ointment Apply 1 application topically as  needed (to any wounds until healed- apply after cleansing, then cover with a non-stick dressing).   NONFORMULARY OR COMPOUNDED ITEM Apply 1 application topically See admin instructions. Lorazepam gel 2 mg/ml: Apply 1 ml to inner wrist three times a day as needed for agitation or anxiety   olmesartan 20 MG tablet Commonly known as:  BENICAR Take 1 tablet (20 mg total) by mouth daily.   pantoprazole 40 MG tablet Commonly known as:  PROTONIX Take 1 tablet (40 mg total) by mouth daily.   polyethylene glycol packet Commonly known as:  MIRALAX / GLYCOLAX Take 17 g by mouth daily. What changed:    when to take this  reasons to take this   PRESERVISION/LUTEIN Caps Take 1 capsule by mouth 2 (two) times daily.   risperiDONE 0.25 MG tablet Commonly known as:  RISPERDAL Take 0.25 mg by mouth every 12 (twelve) hours as needed ("for behaviors," per [facility]).   vitamin B-12 1000 MCG tablet Commonly known as:  CYANOCOBALAMIN Take 1 tablet (1,000 mcg total) by mouth daily.       Follow-up Information    Plotnikov, Evie Lacks, MD. Schedule an appointment as soon as possible for a visit in 2 week(s).   Specialty:  Internal Medicine Contact information: [place] [address] [phone]           Major procedures and Radiology Reports - PLEASE review detailed and final reports thoroughly  -         Ct Head Wo Contrast  Result Date: 08/23/2018 CLINICAL DATA:  Unresponsive episode with altered mental status and confusion. EXAM: CT HEAD WITHOUT CONTRAST TECHNIQUE: Contiguous axial images were obtained from the base of the skull through the vertex without intravenous contrast. COMPARISON:  February 21, 2018. FINDINGS: Brain: Moderate diffuse atrophy is  stable. There is no intracranial mass, hemorrhage, extra-axial fluid collection, or midline shift. There is small vessel disease in the centra semiovale bilaterally, stable. Small vessel disease is noted in the the right lentiform nucleus and in portions of each internal capsule anteriorly. No acute infarct is evident. Vascular: No hyperdense vessel. There is calcification in each carotid siphon region. Skull: The bony calvarium appears intact. Sinuses/Orbits: There is mucosal thickening in several ethmoid air cells. Visualized paranasal sinuses elsewhere clear. Orbits appear symmetric bilaterally. Patient appears to have undergone cataract removal on the left. Other: Mastoid air cells are clear. There is debris in each external auditory canal. IMPRESSION: Stable atrophy with supratentorial small vessel disease. No acute infarct evident. No mass or hemorrhage. There are foci of arterial vascular calcification. There is mucosal thickening in several ethmoid air cells. There is probable cerumen in each external auditory canal. Electronically Signed   By: Lowella Grip III M.D.   On: 08/23/2018 12:27   Dg Chest Portable 1 View  Result Date: 08/23/2018 CLINICAL DATA:  Syncope and GI bleed EXAM: PORTABLE CHEST 1 VIEW COMPARISON:  08/21/2017 FINDINGS: Unchanged cardiomediastinal contours with calcific aortic atherosclerosis. Clear lungs. No pleural effusion or pneumothorax. IMPRESSION: No active disease. Electronically Signed   By: Ulyses Jarred M.D.   On: 08/23/2018 13:52   Dg Shoulder Left  Result Date: 08/06/2018 CLINICAL DATA:  Un witnessed fall. EXAM: LEFT SHOULDER - 2+ VIEW COMPARISON:  None. FINDINGS: No fracture or dislocation.  No bone lesion. Narrowed glenohumeral joint. Marginal spurring from the inferior humeral head. Femur head lies just below the acromion consistent with a chronic full-thickness rotator cuff tear. Bones are demineralized. Soft tissues are unremarkable.  IMPRESSION: 1. No fracture  or dislocation. 2. Chronic full-thickness rotator cuff tear. 3. Glenohumeral joint arthropathic changes. Electronically Signed   By: Lajean Manes M.D.   On: 08/06/2018 17:27    Micro Results     No results found for this or any previous visit (from the past 240 hour(s)).  Today   Subjective    Larita Fife today has no headache,no chest abdominal pain,no new weakness tingling or numbness, feels much better wants to go home today    Objective   Blood pressure 133/72, pulse 61, temperature 98 F (36.7 C), temperature source Oral, resp. rate 15, height 5\' 8"  (1.727 m), weight 48.4 kg, SpO2 99 %.   Intake/Output Summary (Last 24 hours) at 08/25/2018 1046 Last data filed at 08/25/2018 0700 Gross per 24 hour  Intake 412 ml  Output 1600 ml  Net -1188 ml    Exam  Awake but pleasantly confused, No new F.N deficits, Normal affect [place].AT,PERRAL Supple Neck,No JVD, No cervical lymphadenopathy appriciated.  Symmetrical Chest wall movement, Good air movement bilaterally, CTAB RRR,No Gallops,Rubs or new Murmurs, No Parasternal Heave +ve B.Sounds, Abd Soft, Non tender, No organomegaly appriciated, No rebound -guarding or rigidity. No Cyanosis, Clubbing or edema, No new Rash or bruise   Data Review   CBC w Diff:  Lab Results  Component Value Date   WBC 6.5 08/25/2018   HGB 8.6 (L) 08/25/2018   HCT 26.6 (L) 08/25/2018   PLT 280 08/25/2018   LYMPHOPCT 5 08/23/2018   MONOPCT 4 08/23/2018   EOSPCT 0 08/23/2018   BASOPCT 0 08/23/2018    CMP:  Lab Results  Component Value Date   NA 136 08/25/2018   K 3.5 08/25/2018   CL 102 08/25/2018   CO2 25 08/25/2018   BUN 19 08/25/2018   CREATININE 0.68 08/25/2018   PROT 4.6 (L) 08/24/2018   ALBUMIN 2.2 (L) 08/24/2018   BILITOT 0.8 08/24/2018   ALKPHOS 63 08/24/2018   AST 16 08/24/2018   ALT 13 08/24/2018  .   Total Time in preparing paper work, data evaluation and todays exam - 4 minutes  Lala Lund M.D on 08/25/2018 at  10:46 AM  [facility]   Office  [phone]

## 2018-08-25 NOTE — NC FL2 (Signed)
[place] MEDICAID FL2 LEVEL OF CARE SCREENING TOOL     IDENTIFICATION  Patient Name: Pamela Callahan Birthdate: 1929/03/12 Sex: female Admission Date (Current Location): 08/23/2018  [facility] [place] and [place] Number:  Herbalist and Address:  The . [facility], [place] [address], [place], [place] [address]      Provider Number: [id number]  Attending Physician Name and Address:  Thurnell Lose, MD  Relative Name and Phone Number:  [name], son, [phone]    Current Level of Care: Hospital Recommended Level of Care: [place] Prior Approval Number:    Date Approved/Denied:   PASRR Number:    Discharge Plan: Other (Comment)(ALF)    Current Diagnoses: Patient Active Problem List   Diagnosis Date Noted  . Gastrointestinal hemorrhage   . Advanced care planning/counseling discussion   . Goals of care, counseling/discussion   . Palliative care by specialist   . Symptomatic anemia   . Syncope and collapse 08/23/2018  . Iron deficiency anemia due to chronic blood loss 08/23/2018  . Lactic acidosis 08/23/2018  . Seizure ([place]) 08/23/2018  . Severe dementia ([place]) 02/12/2017  . Fall 02/11/2017  . Depression with anxiety 02/11/2017  . Closed L2 vertebral fracture ([place]) 02/11/2017  . Traumatic epidural hematoma 02/11/2017  . Memory loss 03/26/2016  . Osteoarthritis of left knee 12/25/2015  . Hearing loss of aging 12/25/2015  . Cough 08/06/2015  . Leg weakness, bilateral 07/01/2015  . Subacute confusional state 07/01/2015  . B12 deficiency 09/17/2014  . Hyponatremia 08/30/2014  . Fatigue 08/30/2014  . Hip hematoma, right 06/12/2014  . Rash and nonspecific skin eruption 03/12/2014  . PAC (premature atrial contraction) 03/23/2013  . Heart murmur 03/06/2013  . RBBB (right bundle branch block) 03/06/2013  . NEOPLASM OF UNCERTAIN BEHAVIOR OF SKIN 09/09/2010  . SOLAR KERATOSIS 03/24/2010  . ALLERGIC RHINITIS 09/26/2008  .  Dyslipidemia 01/02/2008  . Anxiety state 01/02/2008  . GERD 01/02/2008  . Situational depression 09/26/2007  . Essential hypertension 09/26/2007  . OSTEOARTHRITIS 09/26/2007  . LOW BACK PAIN 09/26/2007  . OSTEOPENIA 09/26/2007    Orientation RESPIRATION BLADDER Height & Weight     Self, Time, Situation, Place  Normal Incontinent Weight: 48.4 kg Height:  5\' 8"  (172.7 cm)  BEHAVIORAL SYMPTOMS/MOOD NEUROLOGICAL BOWEL NUTRITION STATUS      Incontinent Diet(Mechanical soft)  AMBULATORY STATUS COMMUNICATION OF NEEDS Skin   Extensive Assist Verbally Normal                       Personal Care Assistance Level of Assistance  Bathing, Feeding, Dressing Bathing Assistance: Maximum assistance Feeding assistance: Maximum assistance Dressing Assistance: Maximum assistance     Functional Limitations Info  Sight, Hearing, Speech Sight Info: Adequate Hearing Info: Adequate Speech Info: Adequate    SPECIAL CARE FACTORS FREQUENCY                       Contractures Contractures Info: Not present    Additional Factors Info  Code Status, Allergies, Psychotropic Code Status Info: DNR Allergies Info:  Latex, Ace Inhibitors, Penicillins, Yellow Dye Psychotropic Info: Celexa; Depakote;Ativan         Current Medications (08/25/2018):  Discharge Medications: STOP taking these medications   celecoxib 100 MG capsule Commonly known as:  CELEBREX     TAKE these medications   acetaminophen 500 MG tablet Commonly known as:  TYLENOL Take 1,000 mg by mouth 3 (three) times daily.  aspirin 81 MG chewable tablet Chew 81 mg by mouth daily.   citalopram 10 MG tablet Commonly known as:  CELEXA Take 20 mg by mouth daily at 12 noon.   divalproex 250 MG DR tablet Commonly known as:  DEPAKOTE Take 1 tablet (250 mg total) by mouth 3 (three) times daily. Take 250 mg by mouth three times a day- lunch, supper, and bedtime What changed:  when to take this   donepezil 10 MG  tablet Commonly known as:  ARICEPT Take 1 tablet (10 mg total) by mouth at bedtime. What changed:  how much to take   ferrous sulfate 325 (65 FE) MG tablet Take 325 mg by mouth daily with breakfast.   fexofenadine 180 MG tablet Commonly known as:  ALLEGRA Take 1 tablet (180 mg total) by mouth daily.   fluticasone 50 MCG/ACT nasal spray Commonly known as:  FLONASE Place 1 spray into both nostrils daily. As needed What changed:  additional instructions   hydrochlorothiazide 25 MG tablet Commonly known as:  HYDRODIURIL Take 25 mg by mouth daily.   loperamide 2 MG tablet Commonly known as:  IMODIUM A-D Take 1 tablet (2 mg total) by mouth 3 (three) times daily as needed for diarrhea or loose stools. What changed:  when to take this   LORazepam 0.5 MG tablet Commonly known as:  ATIVAN Take 0.25 mg by mouth See admin instructions. Take 0.25 mg by mouth two times a day- at lunch and supper   magnesium gluconate 500 MG tablet Commonly known as:  MAGONATE Take 500 mg by mouth daily.   neomycin-bacitracin-polymyxin [phone] ointment Apply 1 application topically as needed (to any wounds until healed- apply after cleansing, then cover with a non-stick dressing).   NONFORMULARY OR COMPOUNDED ITEM Apply 1 application topically See admin instructions. Lorazepam gel 2 mg/ml: Apply 1 ml to inner wrist three times a day as needed for agitation or anxiety   olmesartan 20 MG tablet Commonly known as:  BENICAR Take 1 tablet (20 mg total) by mouth daily.   pantoprazole 40 MG tablet Commonly known as:  PROTONIX Take 1 tablet (40 mg total) by mouth daily.   polyethylene glycol packet Commonly known as:  MIRALAX / GLYCOLAX Take 17 g by mouth daily. What changed:    when to take this  reasons to take this   PRESERVISION/LUTEIN Caps Take 1 capsule by mouth 2 (two) times daily.   risperiDONE 0.25 MG tablet Commonly known as:  RISPERDAL Take 0.25 mg by mouth every 12  (twelve) hours as needed ("for behaviors," per [facility]).   vitamin B-12 1000 MCG tablet Commonly known as:  CYANOCOBALAMIN Take 1 tablet (1,000 mcg total) by mouth daily.     Relevant Imaging Results:  Relevant Lab Results:   Additional Information SS#: [id number]  [place],

## 2018-08-25 NOTE — Progress Notes (Signed)
Daily Progress Note   Patient Name: Pamela Callahan       Date: 08/25/2018 DOB: October 13, 1928  Age: 85 y.o. MRN#: [id number] Attending Physician: Thurnell Lose, MD Primary Care Physician: Cassandria Anger, MD Admit Date: 08/23/2018  Reason for Consultation/Follow-up: Establishing goals of care  Subjective: Patient arouses easily to my voice. No complaints. States her hands are cold, thanks me for covering her up with the blanket.   Hgb noted to drop this morning. Discussed with sonChriss Czar- patient likely not candidate for residential Hospice. Recommend Hospice at ALF. Ron agrees with this plan.  Review of Systems  Unable to perform ROS: Dementia    Length of Stay: 2  Current Medications: Scheduled Meds:  . acetaminophen  650 mg Oral Q8H  . citalopram  20 mg Oral Q1200  . divalproex  250 mg Oral TID  . donepezil  10 mg Oral QHS  . feeding supplement (ENSURE ENLIVE)  237 mL Oral BID BM  . LORazepam  0.25 mg Oral BID AC  . pantoprazole (PROTONIX) IV  40 mg Intravenous Q12H    Continuous Infusions:   PRN Meds: [DISCONTINUED] ondansetron **OR** ondansetron (ZOFRAN) IV, risperiDONE  Physical Exam Vitals signs and nursing note reviewed.  Constitutional:      Comments: cachetic  Skin:    Coloration: Skin is pale.  Neurological:     Comments: Not oriented to person or place  Psychiatric:     Comments: Pleasantly confused             Vital Signs: BP 133/72 (BP Location: Right Arm)   Pulse 61   Temp 98 F (36.7 C) (Oral)   Resp 15   Ht 5\' 8"  (1.727 m)   Wt 48.4 kg   SpO2 99%   BMI 16.22 kg/m  SpO2: SpO2: 99 % O2 Device: O2 Device: Room Air O2 Flow Rate: O2 Flow Rate (L/min): 3 L/min  Intake/output summary:   Intake/Output Summary (Last 24 hours) at  08/25/2018 1043 Last data filed at 08/25/2018 0700 Gross per 24 hour  Intake 412 ml  Output 1600 ml  Net -1188 ml   LBM: Last BM Date: 08/24/18 Baseline Weight: Weight: 48.4 kg Most recent weight: Weight: 48.4 kg       Palliative Assessment/Data: PPS: 20%    Flowsheet Rows  Most Recent Value  Intake Tab  Referral Department  Hospitalist  Unit at Time of Referral  Intermediate Care Unit  Palliative Care Primary Diagnosis  Other (Comment) [GI bleed]  Date Notified  08/24/18  Palliative Care Type  New Palliative care  Reason for referral  Clarify Goals of Care  Date of Admission  08/23/18  Date first seen by Palliative Care  08/24/18  # of days Palliative referral response time  0 Day(s)  # of days IP prior to Palliative referral  1  Clinical Assessment  Psychosocial & Spiritual Assessment  Palliative Care Outcomes      Patient Active Problem List   Diagnosis Date Noted  . Gastrointestinal hemorrhage   . Advanced care planning/counseling discussion   . Goals of care, counseling/discussion   . Palliative care by specialist   . Symptomatic anemia   . Syncope and collapse 08/23/2018  . Iron deficiency anemia due to chronic blood loss 08/23/2018  . Lactic acidosis 08/23/2018  . Seizure ([place]) 08/23/2018  . Severe dementia ([place]) 02/12/2017  . Fall 02/11/2017  . Depression with anxiety 02/11/2017  . Closed L2 vertebral fracture ([place]) 02/11/2017  . Traumatic epidural hematoma 02/11/2017  . Memory loss 03/26/2016  . Osteoarthritis of left knee 12/25/2015  . Hearing loss of aging 12/25/2015  . Cough 08/06/2015  . Leg weakness, bilateral 07/01/2015  . Subacute confusional state 07/01/2015  . B12 deficiency 09/17/2014  . Hyponatremia 08/30/2014  . Fatigue 08/30/2014  . Hip hematoma, right 06/12/2014  . Rash and nonspecific skin eruption 03/12/2014  . PAC (premature atrial contraction) 03/23/2013  . Heart murmur 03/06/2013  . RBBB (right bundle branch block)  03/06/2013  . NEOPLASM OF UNCERTAIN BEHAVIOR OF SKIN 09/09/2010  . SOLAR KERATOSIS 03/24/2010  . ALLERGIC RHINITIS 09/26/2008  . Dyslipidemia 01/02/2008  . Anxiety state 01/02/2008  . GERD 01/02/2008  . Situational depression 09/26/2007  . Essential hypertension 09/26/2007  . OSTEOARTHRITIS 09/26/2007  . LOW BACK PAIN 09/26/2007  . OSTEOPENIA 09/26/2007    Palliative Care Assessment & Plan   Patient Profile: 85 y.o. female  with past medical history of advanced dementia, depression, GERD, diverticulitis, low back pain, osteopenia, admitted on 08/23/2018 from [facility].  Patient was found down having suffered possible seizure, work-up revealed severe anemia Hgb 6.6, was reported to have history of black tarry stools for several days prior to admission.  Given history of advanced dementia palliative medicine consulted for goals of care.  Assessment/Recommendations/Plan   Family requests no invasive procedures  Would like patient to discharge back to facility with Hospice  Goals of Care and Additional Recommendations:  Limitations on Scope of Treatment: Avoid Hospitalization, Minimize Medications, No Diagnostics and No Surgical Procedures  Code Status:  DNR  Prognosis:   < 6 months due to advanced demential with decline in cognition, nutrition, function, GI bleed without plan for further treatment or workup- preference for comfort measures only once patient is discharged  Discharge Planning:  [place] with Hospice  Care plan was discussed with patient's son, Ron.  Thank you for allowing the Palliative Medicine Team to assist in the care of this patient.   Time In: 1000 Time Out: 1035 Total Time 35 minutes Prolonged Time Billed no      Greater than 50%  of this time was spent counseling and coordinating care related to the above assessment and plan.  Mariana Kaufman, AGNP-C Palliative Medicine   Please contact Palliative Medicine Team  phone at [phone]  for questions and concerns.

## 2019-01-12 ENCOUNTER — Emergency Department (HOSPITAL_COMMUNITY): Payer: Medicare Other

## 2019-01-12 ENCOUNTER — Encounter (HOSPITAL_COMMUNITY): Payer: Self-pay | Admitting: *Deleted

## 2019-01-12 ENCOUNTER — Emergency Department (HOSPITAL_COMMUNITY)
Admission: EM | Admit: 2019-01-12 | Discharge: 2019-01-12 | Disposition: A | Discharge status: Home / Self Care | Payer: Medicare Other | Attending: Emergency Medicine | Admitting: Emergency Medicine

## 2019-01-12 DIAGNOSIS — Y92129 Unspecified place in nursing home as the place of occurrence of the external cause: Secondary | ICD-10-CM | POA: Insufficient documentation

## 2019-01-12 DIAGNOSIS — S01112A Laceration without foreign body of left eyelid and periocular area, initial encounter: Secondary | ICD-10-CM | POA: Insufficient documentation

## 2019-01-12 DIAGNOSIS — Z23 Encounter for immunization: Secondary | ICD-10-CM | POA: Diagnosis not present

## 2019-01-12 DIAGNOSIS — Z79899 Other long term (current) drug therapy: Secondary | ICD-10-CM | POA: Diagnosis not present

## 2019-01-12 DIAGNOSIS — Y999 Unspecified external cause status: Secondary | ICD-10-CM | POA: Insufficient documentation

## 2019-01-12 DIAGNOSIS — W1830XA Fall on same level, unspecified, initial encounter: Secondary | ICD-10-CM | POA: Diagnosis not present

## 2019-01-12 DIAGNOSIS — Z91040 Latex allergy status: Secondary | ICD-10-CM | POA: Diagnosis not present

## 2019-01-12 DIAGNOSIS — F0390 Unspecified dementia without behavioral disturbance: Secondary | ICD-10-CM | POA: Insufficient documentation

## 2019-01-12 DIAGNOSIS — Y939 Activity, unspecified: Secondary | ICD-10-CM | POA: Insufficient documentation

## 2019-01-12 DIAGNOSIS — I10 Essential (primary) hypertension: Secondary | ICD-10-CM | POA: Diagnosis not present

## 2019-01-12 DIAGNOSIS — W19XXXA Unspecified fall, initial encounter: Secondary | ICD-10-CM

## 2019-01-12 MED ORDER — TETANUS-DIPHTH-ACELL PERTUSSIS 5-2.5-18.5 LF-MCG/0.5 IM SUSP
0.5000 mL | Freq: Once | INTRAMUSCULAR | Status: AC
  Administered 2019-01-12: 0.5 mL via INTRAMUSCULAR
  Filled 2019-01-12: qty 0.5

## 2019-01-12 NOTE — ED Notes (Signed)
Dressing applied to left forehead, pt keeps removing dressing, needing reapplication. Pt is confused, needing continual redirection and reminding to sit back in bed.  [facility] has been notified for transport.

## 2019-01-12 NOTE — ED Notes (Signed)
Notified pt son, RON, on pt status in the ED

## 2019-01-12 NOTE — ED Triage Notes (Signed)
Pt arrives via EMS from [place] facility. Pt was found on floor by staff. Hematoma/lac to the left forehead, no blood thinners. Collar placed by EMS. Per staff she was acting normal. She does have dementia. 162/87, hr 90, 97.7

## 2019-01-12 NOTE — ED Provider Notes (Signed)
[facility] EMERGENCY DEPARTMENT Provider Note   CSN: [id number] Arrival date & time: 01/12/19  0445     History   Chief Complaint Chief Complaint  Patient presents with   Fall    HPI Pamela Callahan is a 85 y.o. female.   The history is provided by the EMS personnel and the nursing home. The history is limited by the condition of the patient (Dementia).  Fall  She has history of hypertension, hyperlipidemia, seizures, dementia and comes in following an unwitnessed fall at the skilled nursing facility where she resides.  Patient is unable to give any history.  Past Medical History:  Diagnosis Date   Allergy    Anxiety    Depression    Diverticulitis    GERD (gastroesophageal reflux disease)    Hyperlipidemia    Hypertension    Low back pain    Macular degeneration of left eye    Osteopenia    PAC (premature atrial contraction)     Patient Active Problem List   Diagnosis Date Noted   Gastrointestinal hemorrhage    Advanced care planning/counseling discussion    Goals of care, counseling/discussion    Palliative care by specialist    Symptomatic anemia    Syncope and collapse 08/23/2018   Iron deficiency anemia due to chronic blood loss 08/23/2018   Lactic acidosis 08/23/2018   Seizure ([place]) 08/23/2018   Severe dementia ([place]) 02/12/2017   Fall 02/11/2017   Depression with anxiety 02/11/2017   Closed L2 vertebral fracture ([place]) 02/11/2017   Traumatic epidural hematoma 02/11/2017   Memory loss 03/26/2016   Osteoarthritis of left knee 12/25/2015   Hearing loss of aging 12/25/2015   Cough 08/06/2015   Leg weakness, bilateral 07/01/2015   Subacute confusional state 07/01/2015   B12 deficiency 09/17/2014   Hyponatremia 08/30/2014   Fatigue 08/30/2014   Hip hematoma, right 06/12/2014   Rash and nonspecific skin eruption 03/12/2014   PAC (premature atrial contraction) 03/23/2013   Heart murmur  03/06/2013   RBBB (right bundle branch block) 03/06/2013   NEOPLASM OF UNCERTAIN BEHAVIOR OF SKIN 09/09/2010   SOLAR KERATOSIS 03/24/2010   ALLERGIC RHINITIS 09/26/2008   Dyslipidemia 01/02/2008   Anxiety state 01/02/2008   GERD 01/02/2008   Situational depression 09/26/2007   Essential hypertension 09/26/2007   OSTEOARTHRITIS 09/26/2007   LOW BACK PAIN 09/26/2007   OSTEOPENIA 09/26/2007    Past Surgical History:  Procedure Laterality Date   BACK SURGERY     CHOLECYSTECTOMY  2006   TOTAL HIP ARTHROPLASTY  2008   Right     OB History   No obstetric history on file.      Home Medications    Prior to Admission medications   Medication Sig Start Date End Date Taking? Authorizing Provider  acetaminophen (TYLENOL) 500 MG tablet Take 1,000 mg by mouth 3 (three) times daily.    [provider]  aspirin 81 MG chewable tablet Chew 81 mg by mouth daily.    [provider]  citalopram (CELEXA) 10 MG tablet Take 20 mg by mouth daily at 12 noon.    [provider]  divalproex (DEPAKOTE) 250 MG DR tablet Take 1 tablet (250 mg total) by mouth 3 (three) times daily. Take 250 mg by mouth three times a day- lunch, supper, and bedtime 08/25/18   Thurnell Lose, MD  donepezil (ARICEPT) 10 MG tablet Take 1 tablet (10 mg total) by mouth at bedtime. Patient taking differently: Take 20 mg  by mouth at bedtime.  04/14/16   Plotnikov, Evie Lacks, MD  ferrous sulfate 325 (65 FE) MG tablet Take 325 mg by mouth daily with breakfast.    [provider]  fexofenadine (ALLEGRA) 180 MG tablet Take 1 tablet (180 mg total) by mouth daily. 12/21/11   Plotnikov, Evie Lacks, MD  fluticasone (FLONASE) 50 MCG/ACT nasal spray Place 1 spray into both nostrils daily. As needed Patient taking differently: Place 1 spray into both nostrils daily.  09/11/13   Plotnikov, Evie Lacks, MD  hydrochlorothiazide (HYDRODIURIL) 25 MG tablet Take 25 mg by mouth daily.    [provider]  loperamide (IMODIUM A-D) 2 MG tablet Take 1 tablet (2 mg total) by mouth 3 (three) times daily as needed for diarrhea or loose stools. 08/25/18   Thurnell Lose, MD  LORazepam (ATIVAN) 0.5 MG tablet Take 0.25 mg by mouth See admin instructions. Take 0.25 mg by mouth two times a day- at lunch and supper    [provider]  magnesium gluconate (MAGONATE) 500 MG tablet Take 500 mg by mouth daily.    [provider]  Multiple Vitamins-Minerals (PRESERVISION/LUTEIN) CAPS Take 1 capsule by mouth 2 (two) times daily.     [provider]  neomycin-bacitracin-polymyxin (NEOSPORIN) [phone] ointment Apply 1 application topically as needed (to any wounds until healed- apply after cleansing, then cover with a non-stick dressing).    [provider]  NONFORMULARY OR COMPOUNDED ITEM Apply 1 application topically See admin instructions. Lorazepam gel 2 mg/ml: Apply 1 ml to inner wrist three times a day as needed for agitation or anxiety    [provider]  olmesartan (BENICAR) 20 MG tablet Take 1 tablet (20 mg total) by mouth daily. 03/26/16   Plotnikov, Evie Lacks, MD  pantoprazole (PROTONIX) 40 MG tablet Take 1 tablet (40 mg total) by mouth daily. 08/25/18   Thurnell Lose, MD  polyethylene glycol (MIRALAX / GLYCOLAX) packet Take 17 g by mouth daily. 08/25/18   Thurnell Lose, MD  risperiDONE (RISPERDAL) 0.25 MG tablet Take 0.25 mg by mouth every 12 (twelve) hours as needed ("for behaviors," per [facility]).    [provider]  vitamin B-12 (CYANOCOBALAMIN) 1000 MCG tablet Take 1 tablet (1,000 mcg total) by mouth daily. 12/25/15   Plotnikov, Evie Lacks, MD    Family History Family History  Problem Relation Age of Onset   Hypertension Mother    Hypertension Other     Social History Social History   Tobacco Use   Smoking status: Never Smoker   Smokeless tobacco: Never Used  Substance Use Topics   Alcohol use: No   Drug use: No      Allergies   Latex, Ace inhibitors, Penicillins, and Yellow dye   Review of Systems Review of Systems  Unable to perform ROS: Dementia     Physical Exam Updated Vital Signs BP (!) 153/103    Pulse 61    Temp 97.7 F (36.5 C) (Oral)    Resp 18    SpO2 98%   Physical Exam Vitals signs and nursing note reviewed.    85 year old female, resting comfortably and in no acute distress. Vital signs are significant for elevated blood pressure. Oxygen saturation is 98%, which is normal. Head is normocephalic.  Abrasion is seen on the left side of the forehead, and there is a 2 cm laceration on the superior lateral aspect of the left orbit.  There is no step-off of the orbital  rim and there is no significant swelling.Marland Kitchen PERRLA, EOMI. Oropharynx is clear. Neck is immobilized in a stiff cervical collar and is nontender without adenopathy or JVD. Back is nontender and there is no CVA tenderness. Lungs are clear without rales, wheezes, or rhonchi. Chest is nontender. Heart has regular rate and rhythm without murmur. Abdomen is soft, flat, nontender without masses or hepatosplenomegaly and peristalsis is normoactive. Extremities have no cyanosis or edema, full range of motion is present. Skin is warm and dry without rash. Neurologic: Awake and vocal but not oriented to person or place or time, not able to hold a conversation, not able to follow commands, cranial nerves are intact, there are no motor or sensory deficits.  ED Treatments / Results   Radiology Ct Head Wo Contrast  Result Date: 01/12/2019 CLINICAL DATA:  Unwitnessed fall. Patient found on floor. Left forehead hematoma. Maxillofacial trauma, blunt. Initial encounter EXAM: CT HEAD WITHOUT CONTRAST CT CERVICAL SPINE WITHOUT CONTRAST TECHNIQUE: Multidetector CT imaging of the head and cervical spine was performed following the standard protocol without intravenous contrast. Multiplanar CT image reconstructions of the cervical spine  were also generated. COMPARISON:  CT head without contrast 08/23/2018 CTA head and neck 02/10/2017 FINDINGS: CT HEAD FINDINGS Brain: Advanced atrophy and white matter disease is stable. No acute infarct, hemorrhage, or mass lesion is present. The ventricles are proportionate to the degree of atrophy. No significant extraaxial fluid collection is present. Brainstem and cerebellum are unremarkable Vascular: Atherosclerotic calcifications are present within the cavernous internal carotid arteries bilaterally. There is no hyperdense vessel. Skull: Left supraorbital frontal scalp hematoma is noted. There is no underlying fracture. No radiopaque foreign body is present. Chronic lytic lesion in the left paramedian occipital skull is stable. No other focal lytic or blastic lesions are present. Sinuses/Orbits: Fluid level is present in the lateral recess of the right sphenoid sinus. The paranasal sinuses and mastoid air cells are otherwise clear. Bilateral lens replacements are present. Globes and orbits are otherwise normal. CT CERVICAL SPINE FINDINGS Alignment: Slight retrolisthesis is present at C4-5. AP alignment is otherwise anatomic. Leftward curvature is centered at C5-6. Skull base and vertebrae: The craniocervical junction is normal. There is chronic fusion at C2-3 and C3-4. Uncovertebral spurring contributes to foraminal narrowing at C4-5, C5-6, and C6-7. Acute or healing fractures are present. A remote superior endplate T1 fracture is noted without retropulsed bone. Soft tissues and spinal canal: No prevertebral fluid or swelling. No visible canal hematoma. Disc levels: Foraminal narrowing is associated with uncovertebral spurring. Upper chest: The lung apices are clear. Thoracic inlet is within normal limits. IMPRESSION: 1. Left supraorbital scalp hematoma without underlying fracture. 2. Stable advanced atrophy and white matter disease. No acute intracranial abnormality. 3. Chronic degenerative changes of the  cervical spine without evidence for acute fracture. 4. Acquired fusion at C2-3 and C3-4. 5. Atherosclerosis Electronically Signed   By: San Morelle M.D.   On: 01/12/2019 05:58   Ct Cervical Spine Wo Contrast  Result Date: 01/12/2019 CLINICAL DATA:  Unwitnessed fall. Patient found on floor. Left forehead hematoma. Maxillofacial trauma, blunt. Initial encounter EXAM: CT HEAD WITHOUT CONTRAST CT CERVICAL SPINE WITHOUT CONTRAST TECHNIQUE: Multidetector CT imaging of the head and cervical spine was performed following the standard protocol without intravenous contrast. Multiplanar CT image reconstructions of the cervical spine were also generated. COMPARISON:  CT head without contrast 08/23/2018 CTA head and neck 02/10/2017 FINDINGS: CT HEAD FINDINGS Brain: Advanced atrophy and white matter disease is stable. No acute infarct, hemorrhage,  or mass lesion is present. The ventricles are proportionate to the degree of atrophy. No significant extraaxial fluid collection is present. Brainstem and cerebellum are unremarkable Vascular: Atherosclerotic calcifications are present within the cavernous internal carotid arteries bilaterally. There is no hyperdense vessel. Skull: Left supraorbital frontal scalp hematoma is noted. There is no underlying fracture. No radiopaque foreign body is present. Chronic lytic lesion in the left paramedian occipital skull is stable. No other focal lytic or blastic lesions are present. Sinuses/Orbits: Fluid level is present in the lateral recess of the right sphenoid sinus. The paranasal sinuses and mastoid air cells are otherwise clear. Bilateral lens replacements are present. Globes and orbits are otherwise normal. CT CERVICAL SPINE FINDINGS Alignment: Slight retrolisthesis is present at C4-5. AP alignment is otherwise anatomic. Leftward curvature is centered at C5-6. Skull base and vertebrae: The craniocervical junction is normal. There is chronic fusion at C2-3 and C3-4.  Uncovertebral spurring contributes to foraminal narrowing at C4-5, C5-6, and C6-7. Acute or healing fractures are present. A remote superior endplate T1 fracture is noted without retropulsed bone. Soft tissues and spinal canal: No prevertebral fluid or swelling. No visible canal hematoma. Disc levels: Foraminal narrowing is associated with uncovertebral spurring. Upper chest: The lung apices are clear. Thoracic inlet is within normal limits. IMPRESSION: 1. Left supraorbital scalp hematoma without underlying fracture. 2. Stable advanced atrophy and white matter disease. No acute intracranial abnormality. 3. Chronic degenerative changes of the cervical spine without evidence for acute fracture. 4. Acquired fusion at C2-3 and C3-4. 5. Atherosclerosis Electronically Signed   By: San Morelle M.D.   On: 01/12/2019 05:58    Procedures .Marland KitchenLaceration Repair  Date/Time: 01/12/2019 5:01 AM Performed by: Delora Fuel, MD Authorized by: Delora Fuel, MD   Consent:    Consent obtained: Implied consent. Anesthesia (see MAR for exact dosages):    Anesthesia method:  None Laceration details:    Location:  Face   Face location:  L eyebrow   Length (cm):  2   Depth (mm):  2 Repair type:    Repair type:  Simple Pre-procedure details:    Preparation:  Patient was prepped and draped in usual sterile fashion and imaging obtained to evaluate for foreign bodies Exploration:    Hemostasis achieved with:  Direct pressure   Wound exploration: entire depth of wound probed and visualized     Wound extent: no foreign bodies/material noted     Contaminated: no   Treatment:    Area cleansed with:  Saline   Amount of cleaning:  Standard Skin repair:    Repair method:  Tissue adhesive Approximation:    Approximation:  Close Post-procedure details:    Dressing:  Open (no dressing)    Medications Ordered in ED Medications  Tdap (BOOSTRIX) injection 0.5 mL (has no administration in time range)      Initial Impression / Assessment and Plan / ED Course  I have reviewed the triage vital signs and the nursing notes.  Pertinent labs & imaging results that were available during my care of the patient were reviewed by me and considered in my medical decision making (see chart for details).  Unwitnessed fall with obvious head trauma.  Old records are reviewed, and she has multiple other ED visits for falls.  I can find no evidence of tetanus immunization, so Tdap booster is given.  She is sent for CT of head and cervical spine.  Laceration is closed with tissue adhesive.  CT scan shows no fracture, no  intracranial injury, no acute C-spine injury.  She is discharged back to her skilled nursing facility.  Final Clinical Impressions(s) / ED Diagnoses   Final diagnoses:  Fall at nursing home, initial encounter  Laceration of eyebrow, left, initial encounter    ED Discharge Orders    None       Delora Fuel, MD 23/95/32 [phone]

## 2019-09-03 ENCOUNTER — Other Ambulatory Visit: Payer: Self-pay

## 2019-09-03 ENCOUNTER — Encounter (HOSPITAL_COMMUNITY): Payer: Self-pay

## 2019-09-03 ENCOUNTER — Emergency Department (HOSPITAL_COMMUNITY)
Admission: EM | Admit: 2019-09-03 | Discharge: 2019-09-04 | Disposition: A | Discharge status: Skilled Nursing Facility | Payer: Medicare Other | Attending: Emergency Medicine | Admitting: Emergency Medicine

## 2019-09-03 DIAGNOSIS — N390 Urinary tract infection, site not specified: Secondary | ICD-10-CM | POA: Diagnosis present

## 2019-09-03 LAB — URINALYSIS, ROUTINE W REFLEX MICROSCOPIC
Bilirubin Urine: NEGATIVE
Glucose, UA: NEGATIVE mg/dL
Hgb urine dipstick: NEGATIVE
Ketones, ur: NEGATIVE mg/dL
Leukocytes,Ua: NEGATIVE
Nitrite: POSITIVE — AB
Protein, ur: NEGATIVE mg/dL
Specific Gravity, Urine: 1.025 (ref 1.005–1.030)
pH: 5 (ref 5.0–8.0)

## 2019-09-03 MED ORDER — SULFAMETHOXAZOLE-TRIMETHOPRIM 800-160 MG PO TABS: 1.0000 | ORAL_TABLET | Freq: Two times a day (BID) | ORAL | 0 refills | Status: AC

## 2019-09-03 MED ORDER — SULFAMETHOXAZOLE-TRIMETHOPRIM 800-160 MG PO TABS
1.0000 | ORAL_TABLET | Freq: Once | ORAL | Status: AC
  Administered 2019-09-03: 23:00:00 1 via ORAL
  Filled 2019-09-03: qty 1

## 2019-09-03 NOTE — ED Triage Notes (Signed)
Per EMS, nursing staff at [facility] noticed loose stool-like substance around vagina. Ems states patient had strong UTI smell when moving her. Hx of dementia and alzheimer's. Patient is nonambulatory and confused at baseline. Nursing staff denies patient having any occurences of emesis.

## 2019-09-03 NOTE — ED Notes (Signed)
[facility] contacted for transportation. [facility] notified of patient's discharge. Paperwork completed.

## 2019-09-03 NOTE — ED Provider Notes (Signed)
[facility] DEPT Provider Note   CSN: [id number] Arrival date & time: 09/03/19  [id number]     History No chief complaint on file.   Pamela Callahan is a 86 y.o. female.  HPI    86 year old female sent for evaluation from nursing facility.  Concern for possible UTI.  Stool-like substance around the vagina earlier today.  Patient is not ambulatory and has advanced dementia.  Confused at baseline.  No vomiting or diarrhea reported.  No fever reported. Past Medical History:  Diagnosis Date  . Allergy   . Anxiety   . Depression   . Diverticulitis   . GERD (gastroesophageal reflux disease)   . Hyperlipidemia   . Hypertension   . Low back pain   . Macular degeneration of left eye   . Osteopenia   . PAC (premature atrial contraction)     Patient Active Problem List   Diagnosis Date Noted  . Gastrointestinal hemorrhage   . Advanced care planning/counseling discussion   . Goals of care, counseling/discussion   . Palliative care by specialist   . Symptomatic anemia   . Syncope and collapse 08/23/2018  . Iron deficiency anemia due to chronic blood loss 08/23/2018  . Lactic acidosis 08/23/2018  . Seizure ([place]) 08/23/2018  . Severe dementia ([place]) 02/12/2017  . Fall 02/11/2017  . Depression with anxiety 02/11/2017  . Closed L2 vertebral fracture ([place]) 02/11/2017  . Traumatic epidural hematoma 02/11/2017  . Memory loss 03/26/2016  . Osteoarthritis of left knee 12/25/2015  . Hearing loss of aging 12/25/2015  . Cough 08/06/2015  . Leg weakness, bilateral 07/01/2015  . Subacute confusional state 07/01/2015  . B12 deficiency 09/17/2014  . Hyponatremia 08/30/2014  . Fatigue 08/30/2014  . Hip hematoma, right 06/12/2014  . Rash and nonspecific skin eruption 03/12/2014  . PAC (premature atrial contraction) 03/23/2013  . Heart murmur 03/06/2013  . RBBB (right bundle branch block) 03/06/2013  . NEOPLASM OF UNCERTAIN BEHAVIOR OF SKIN 09/09/2010  .  SOLAR KERATOSIS 03/24/2010  . ALLERGIC RHINITIS 09/26/2008  . Dyslipidemia 01/02/2008  . Anxiety state 01/02/2008  . GERD 01/02/2008  . Situational depression 09/26/2007  . Essential hypertension 09/26/2007  . OSTEOARTHRITIS 09/26/2007  . LOW BACK PAIN 09/26/2007  . OSTEOPENIA 09/26/2007    Past Surgical History:  Procedure Laterality Date  . BACK SURGERY    . CHOLECYSTECTOMY  2006  . TOTAL HIP ARTHROPLASTY  2008   Right     OB History   No obstetric history on file.     Family History  Problem Relation Age of Onset  . Hypertension Mother   . Hypertension Other     Social History   Tobacco Use  . Smoking status: Never Smoker  . Smokeless tobacco: Never Used  Substance Use Topics  . Alcohol use: No  . Drug use: No    Home Medications Prior to Admission medications   Medication Sig Start Date End Date Taking? Authorizing Provider  acetaminophen (TYLENOL) 500 MG tablet Take 1,000 mg by mouth 3 (three) times daily.   Yes [provider]  aspirin 81 MG chewable tablet Chew 81 mg by mouth daily.   Yes [provider]  citalopram (CELEXA) 10 MG tablet Take 10 mg by mouth daily.    Yes [provider]  colestipol (COLESTID) 5 g packet Take 5 g by mouth daily.   Yes [provider]  donepezil (ARICEPT) 10 MG tablet Take 1 tablet (10 mg total) by mouth at  bedtime. 04/14/16  Yes Plotnikov, Evie Lacks, MD  ferrous sulfate 325 (65 FE) MG tablet Take 325 mg by mouth daily with breakfast.   Yes [provider]  fexofenadine (ALLEGRA) 180 MG tablet Take 1 tablet (180 mg total) by mouth daily. 12/21/11  Yes Plotnikov, Evie Lacks, MD  fluticasone (FLONASE) 50 MCG/ACT nasal spray Place 1 spray into both nostrils daily. As needed Patient taking differently: Place 1 spray into both nostrils daily as needed for allergies.  09/11/13  Yes Plotnikov, Evie Lacks, MD  hydrochlorothiazide (HYDRODIURIL) 25 MG tablet Take 25 mg by mouth daily.   Yes  [provider]  loperamide (IMODIUM A-D) 2 MG tablet Take 1 tablet (2 mg total) by mouth 3 (three) times daily as needed for diarrhea or loose stools. 08/25/18  Yes Thurnell Lose, MD  LORazepam (ATIVAN) 0.5 MG tablet Take 0.5 mg by mouth 2 (two) times daily.    Yes [provider]  magnesium gluconate (MAGONATE) 500 MG tablet Take 500 mg by mouth daily.   Yes [provider]  Multiple Vitamins-Minerals (PRESERVISION/LUTEIN) CAPS Take 1 capsule by mouth 2 (two) times daily.    Yes [provider]  NONFORMULARY OR COMPOUNDED ITEM Apply 1 application topically See admin instructions. Lorazepam gel 2 mg/ml: Apply 1 ml to inner wrist three times a day as needed for agitation or anxiety   Yes [provider]  olmesartan (BENICAR) 20 MG tablet Take 1 tablet (20 mg total) by mouth daily. 03/26/16  Yes Plotnikov, Evie Lacks, MD  pantoprazole (PROTONIX) 40 MG tablet Take 1 tablet (40 mg total) by mouth daily. 08/25/18  Yes Thurnell Lose, MD  polyethylene glycol (MIRALAX / GLYCOLAX) packet Take 17 g by mouth daily. 08/25/18  Yes Thurnell Lose, MD  risperiDONE (RISPERDAL) 0.25 MG tablet Take 0.25 mg by mouth every 12 (twelve) hours as needed ("for behaviors," per [facility]).   Yes [provider]  triamcinolone (KENALOG) 0.025 % cream Apply 1 application topically 2 (two) times daily as needed (to rash).   Yes [provider]  vitamin B-12 (CYANOCOBALAMIN) 1000 MCG tablet Take 1 tablet (1,000 mcg total) by mouth daily. 12/25/15  Yes Plotnikov, Evie Lacks, MD  divalproex (DEPAKOTE) 250 MG DR tablet Take 1 tablet (250 mg total) by mouth 3 (three) times daily. Take 250 mg by mouth three times a day- lunch, supper, and bedtime Patient not taking: Reported on 09/03/2019 08/25/18   Thurnell Lose, MD  sulfamethoxazole-trimethoprim (BACTRIM DS) 800-160 MG tablet Take 1 tablet by mouth 2 (two) times daily for 7 days. 09/03/19 09/10/19  Virgel Manifold, MD     Allergies    Latex, Ace inhibitors, Penicillins, and Yellow dye  Review of Systems   Review of Systems Level 5 caveat because of dementia.  Physical Exam Updated Vital Signs BP 131/78   Pulse 65   Temp 98.3 F (36.8 C) (Oral)   Resp 16   SpO2 96%   Physical Exam Vitals and nursing note reviewed.  Constitutional:      General: She is not in acute distress.    Appearance: She is well-developed.     Comments: Laying in bed on side.  Frail/chronically ill-appearing but in no apparent distress.  Will make eye contact with me but not responding verbally to questions.  HENT:     Head: Normocephalic and atraumatic.  Eyes:     General:        Right eye: No discharge.  Left eye: No discharge.     Conjunctiva/sclera: Conjunctivae normal.  Cardiovascular:     Rate and Rhythm: Normal rate and regular rhythm.     Heart sounds: Normal heart sounds. No murmur. No friction rub. No gallop.   Pulmonary:     Effort: Pulmonary effort is normal. No respiratory distress.     Breath sounds: Normal breath sounds.  Abdominal:     General: There is no distension.     Palpations: Abdomen is soft.     Tenderness: There is no abdominal tenderness.  Genitourinary:    Comments: Normal external female genitalia.  No concerning lesions noted. Musculoskeletal:        General: No tenderness.     Cervical back: Neck supple.  Skin:    General: Skin is warm and dry.  Neurological:     Mental Status: She is alert.     ED Results / Procedures / Treatments   Labs (all labs ordered are listed, but only abnormal results are displayed) Labs Reviewed  URINALYSIS, ROUTINE W REFLEX MICROSCOPIC - Abnormal; Notable for the following components:      Result Value   Nitrite POSITIVE (*)    Bacteria, UA RARE (*)    All other components within normal limits  URINE CULTURE    EKG None  Radiology No results found.  Procedures Procedures (including critical care time)  Medications Ordered  in ED Medications  sulfamethoxazole-trimethoprim (BACTRIM DS) 800-160 MG per tablet 1 tablet (1 tablet Oral Given 09/03/19 2328)    ED Course  I have reviewed the triage vital signs and the nursing notes.  Pertinent labs & imaging results that were available during my care of the patient were reviewed by me and considered in my medical decision making (see chart for details).    MDM Rules/Calculators/A&P                     89yF with UTI. Consider rectovaginal fistula with reported history but no obvious stool noted on my exam.  UTI probably just from fecal contamination.  Antibiotics.  Discharged back to nursing facility.  Final Clinical Impression(s) / ED Diagnoses Final diagnoses:  Urinary tract infection without hematuria, site unspecified    Rx / DC Orders ED Discharge Orders         Ordered    sulfamethoxazole-trimethoprim (BACTRIM DS) 800-160 MG tablet  2 times daily     09/03/19 2317           Virgel Manifold, MD 09/08/19 1934

## 2019-09-04 MED ORDER — SULFAMETHOXAZOLE-TRIMETHOPRIM 800-160 MG PO TABS: 1.0000 | ORAL_TABLET | Freq: Two times a day (BID) | ORAL | 0 refills | Status: AC

## 2019-09-04 NOTE — ED Provider Notes (Signed)
   2:58 AM Received call from SNF after patient returned there via [facility]--  patient was given prescription for bactrim for 7 days, however only written for 10 tablets.  I have reviewed prescription and this does appear to be the case.  I have sent an additional 4 tablets to the pharmacy to complete full 7 day course of treatment as recommended by initial provider.   Larene Pickett, PA-C 09/04/19 0300    Merryl Hacker, MD 09/04/19 [phone]

## 2019-09-05 LAB — URINE CULTURE: Culture: 80000 — AB

## 2019-09-06 ENCOUNTER — Telehealth: Payer: Self-pay | Admitting: *Deleted

## 2019-09-06 NOTE — Telephone Encounter (Signed)
Post ED Visit - Positive Culture Follow-up  Culture report reviewed by antimicrobial stewardship pharmacist: [facility] Team []  Elenor Quinones, Pharm.D. []  Heide Guile, Pharm.D., BCPS AQ-ID []  Parks Neptune, Pharm.D., BCPS []  Alycia Rossetti, Pharm.D., BCPS []  [place], [place].D., BCPS, AAHIVP []  Legrand Como, Pharm.D., BCPS, AAHIVP []  Salome Arnt, PharmD, BCPS []  Johnnette Gourd, PharmD, BCPS []  Hughes Better, PharmD, BCPS []  Leeroy Cha, PharmD []  Laqueta Linden, PharmD, BCPS []  Albertina Parr, PharmD  [place] Team []  Leodis Sias, PharmD []  Lindell Spar, PharmD []  Royetta Asal, PharmD []  Graylin Shiver, Rph []  Rema Fendt) Glennon Mac, PharmD []  Arlyn Dunning, PharmD []  Netta Cedars, PharmD []  Dia Sitter, PharmD []  Leone Haven, PharmD []  Gretta Arab, PharmD [x]  Theodis Shove, PharmD []  Peggyann Juba, PharmD []  Reuel Boom, PharmD   Positive urine culture Treated with Sulfamethoxazole-Trimethoprim, organism sensitive to the same and no further patient follow-up is required at this time.  Harlon Flor [facility] 09/06/2019, 10:08 AM

## 2019-09-08 NOTE — ED Provider Notes (Signed)
[facility] DEPT Provider Note   CSN: [id number] Arrival date & time: 09/03/19  [id number]  History No chief complaint on file.   Pamela Callahan is a 86 y.o. female.  HPI    Past Medical History:  Diagnosis Date  . Allergy   . Anxiety   . Depression   . Diverticulitis   . GERD (gastroesophageal reflux disease)   . Hyperlipidemia   . Hypertension   . Low back pain   . Macular degeneration of left eye   . Osteopenia   . PAC (premature atrial contraction)    Patient Active Problem List   Diagnosis Date Noted  . Gastrointestinal hemorrhage   . Advanced care planning/counseling discussion   . Goals of care, counseling/discussion   . Palliative care by specialist   . Symptomatic anemia   . Syncope and collapse 08/23/2018  . Iron deficiency anemia due to chronic blood loss 08/23/2018  . Lactic acidosis 08/23/2018  . Seizure ([place]) 08/23/2018  . Severe dementia ([place]) 02/12/2017  . Fall 02/11/2017  . Depression with anxiety 02/11/2017  . Closed L2 vertebral fracture ([place]) 02/11/2017  . Traumatic epidural hematoma 02/11/2017  . Memory loss 03/26/2016  . Osteoarthritis of left knee 12/25/2015  . Hearing loss of aging 12/25/2015  . Cough 08/06/2015  . Leg weakness, bilateral 07/01/2015  . Subacute confusional state 07/01/2015  . B12 deficiency 09/17/2014  . Hyponatremia 08/30/2014  . Fatigue 08/30/2014  . Hip hematoma, right 06/12/2014  . Rash and nonspecific skin eruption 03/12/2014  . PAC (premature atrial contraction) 03/23/2013  . Heart murmur 03/06/2013  . RBBB (right bundle branch block) 03/06/2013  . NEOPLASM OF UNCERTAIN BEHAVIOR OF SKIN 09/09/2010  . SOLAR KERATOSIS 03/24/2010  . ALLERGIC RHINITIS 09/26/2008  . Dyslipidemia 01/02/2008  . Anxiety state 01/02/2008  . GERD 01/02/2008  . Situational depression 09/26/2007  . Essential hypertension 09/26/2007  . OSTEOARTHRITIS 09/26/2007  . LOW BACK PAIN 09/26/2007  . OSTEOPENIA  09/26/2007   Past Surgical History:  Procedure Laterality Date  . BACK SURGERY    . CHOLECYSTECTOMY  2006  . TOTAL HIP ARTHROPLASTY  2008   Right    OB History   No obstetric history on file.    Family History  Problem Relation Age of Onset  . Hypertension Mother   . Hypertension Other    Social History   Tobacco Use  . Smoking status: Never Smoker  . Smokeless tobacco: Never Used  Substance Use Topics  . Alcohol use: No  . Drug use: No   Home Medications Prior to Admission medications   Medication Sig Start Date End Date Taking? Authorizing Provider  acetaminophen (TYLENOL) 500 MG tablet Take 1,000 mg by mouth 3 (three) times daily.   Yes [provider]  aspirin 81 MG chewable tablet Chew 81 mg by mouth daily.   Yes [provider]  citalopram (CELEXA) 10 MG tablet Take 10 mg by mouth daily.    Yes [provider]  colestipol (COLESTID) 5 g packet Take 5 g by mouth daily.   Yes [provider]  donepezil (ARICEPT) 10 MG tablet Take 1 tablet (10 mg total) by mouth at bedtime. 04/14/16  Yes Plotnikov, Evie Lacks, MD  ferrous sulfate 325 (65 FE) MG tablet Take 325 mg by mouth daily with breakfast.   Yes [provider]  fexofenadine (ALLEGRA) 180 MG tablet Take 1 tablet (180 mg total) by mouth daily. 12/21/11  Yes Plotnikov, Evie Lacks, MD  fluticasone (FLONASE) 50 MCG/ACT nasal spray Place 1 spray into both nostrils daily. As needed Patient taking differently: Place 1 spray into both nostrils daily as needed for allergies.  09/11/13  Yes Plotnikov, Evie Lacks, MD  hydrochlorothiazide (HYDRODIURIL) 25 MG tablet Take 25 mg by mouth daily.   Yes [provider]  loperamide (IMODIUM A-D) 2 MG tablet Take 1 tablet (2 mg total) by mouth 3 (three) times daily as needed for diarrhea or loose stools. 08/25/18  Yes Thurnell Lose, MD  LORazepam (ATIVAN) 0.5 MG tablet Take 0.5 mg by mouth 2 (two) times daily.    Yes [provider]  magnesium gluconate (MAGONATE) 500 MG tablet Take 500 mg by mouth daily.   Yes [provider]  Multiple Vitamins-Minerals (PRESERVISION/LUTEIN) CAPS Take 1 capsule by mouth 2 (two) times daily.    Yes [provider]  NONFORMULARY OR COMPOUNDED ITEM Apply 1 application topically See admin instructions. Lorazepam gel 2 mg/ml: Apply 1 ml to inner wrist three times a day as needed for agitation or anxiety   Yes [provider]  olmesartan (BENICAR) 20 MG tablet Take 1 tablet (20 mg total) by mouth daily. 03/26/16  Yes Plotnikov, Evie Lacks, MD  pantoprazole (PROTONIX) 40 MG tablet Take 1 tablet (40 mg total) by mouth daily. 08/25/18  Yes Thurnell Lose, MD  polyethylene glycol (MIRALAX / GLYCOLAX) packet Take 17 g by mouth daily. 08/25/18  Yes Thurnell Lose, MD  risperiDONE (RISPERDAL) 0.25 MG tablet Take 0.25 mg by mouth every 12 (twelve) hours as needed ("for behaviors," per [facility]).   Yes [provider]  triamcinolone (KENALOG) 0.025 % cream Apply 1 application topically 2 (two) times daily as needed (to rash).   Yes [provider]  vitamin B-12 (CYANOCOBALAMIN) 1000 MCG tablet Take 1 tablet (1,000 mcg total) by mouth daily. 12/25/15  Yes Plotnikov, Evie Lacks, MD  divalproex (DEPAKOTE) 250 MG DR tablet Take 1 tablet (250 mg total) by mouth 3 (three) times daily. Take 250 mg by mouth three times a day- lunch, supper, and bedtime Patient not taking: Reported on 09/03/2019 08/25/18   Thurnell Lose, MD  sulfamethoxazole-trimethoprim (BACTRIM DS) 800-160 MG tablet Take 1 tablet by mouth 2 (two) times daily for 7 days. 09/03/19 09/10/19  Virgel Manifold, MD    Allergies    Latex, Ace inhibitors, Penicillins, and Yellow dye  Review of Systems   Review of Systems All systems reviewed and negative, other than as noted in HPI.  Physical Exam Updated Vital Signs BP (!) 154/70   Pulse (!) 57   Temp 98.3 F (36.8 C) (Oral)   Resp 14   SpO2 96%    Physical Exam  ED Results / Procedures / Treatments   Labs (all labs ordered are listed, but only abnormal results are displayed) Labs Reviewed  URINE CULTURE - Abnormal; Notable for the following components:      Result Value   Culture 80,000 COLONIES/mL ESCHERICHIA COLI (*)    Organism ID, Bacteria ESCHERICHIA COLI (*)    All other components within normal limits  URINALYSIS, ROUTINE W REFLEX MICROSCOPIC - Abnormal; Notable for the following components:   Nitrite POSITIVE (*)    Bacteria, UA RARE (*)    All other components within normal limits    EKG None  Radiology No results found.  Procedures Procedures (including critical care time)  Medications Ordered in ED Medications  sulfamethoxazole-trimethoprim (BACTRIM DS) 800-160 MG per tablet 1 tablet (1  tablet Oral Given 09/03/19 2328)    ED Course  I have reviewed the triage vital signs and the nursing notes.  Pertinent labs & imaging results that were available during my care of the patient were reviewed by me and considered in my medical decision making (see chart for details).    MDM Rules/Calculators/A&P                       Final Clinical Impression(s) / ED Diagnoses Final diagnoses:  Urinary tract infection without hematuria, site unspecified    Rx / DC Orders ED Discharge Orders         Ordered    sulfamethoxazole-trimethoprim (BACTRIM DS) 800-160 MG tablet  2 times daily     09/04/19 0258    sulfamethoxazole-trimethoprim (BACTRIM DS) 800-160 MG tablet  2 times daily     09/03/19 2317           Virgel Manifold, MD 09/08/19 1939

## 2019-09-29 ENCOUNTER — Emergency Department (HOSPITAL_COMMUNITY): Payer: Medicare Other

## 2019-09-29 ENCOUNTER — Emergency Department (HOSPITAL_COMMUNITY)
Admission: EM | Admit: 2019-09-29 | Discharge: 2019-09-29 | Disposition: A | Discharge status: Home / Self Care | Payer: Medicare Other | Attending: Emergency Medicine | Admitting: Emergency Medicine

## 2019-09-29 ENCOUNTER — Encounter (HOSPITAL_COMMUNITY): Payer: Self-pay

## 2019-09-29 ENCOUNTER — Other Ambulatory Visit: Payer: Self-pay

## 2019-09-29 DIAGNOSIS — K921 Melena: Secondary | ICD-10-CM | POA: Diagnosis present

## 2019-09-29 DIAGNOSIS — Z96641 Presence of right artificial hip joint: Secondary | ICD-10-CM | POA: Insufficient documentation

## 2019-09-29 DIAGNOSIS — Z91040 Latex allergy status: Secondary | ICD-10-CM | POA: Diagnosis not present

## 2019-09-29 DIAGNOSIS — F0390 Unspecified dementia without behavioral disturbance: Secondary | ICD-10-CM | POA: Diagnosis not present

## 2019-09-29 DIAGNOSIS — I10 Essential (primary) hypertension: Secondary | ICD-10-CM | POA: Diagnosis not present

## 2019-09-29 DIAGNOSIS — Z7982 Long term (current) use of aspirin: Secondary | ICD-10-CM | POA: Diagnosis not present

## 2019-09-29 DIAGNOSIS — Z79899 Other long term (current) drug therapy: Secondary | ICD-10-CM | POA: Diagnosis not present

## 2019-09-29 DIAGNOSIS — Z9049 Acquired absence of other specified parts of digestive tract: Secondary | ICD-10-CM | POA: Insufficient documentation

## 2019-09-29 LAB — COMPREHENSIVE METABOLIC PANEL
ALT: 18 U/L (ref 0–44)
AST: 16 U/L (ref 15–41)
Albumin: 3.6 g/dL (ref 3.5–5.0)
Alkaline Phosphatase: 60 U/L (ref 38–126)
Anion gap: 8 (ref 5–15)
BUN: 14 mg/dL (ref 8–23)
CO2: 26 mmol/L (ref 22–32)
Calcium: 8.5 mg/dL — ABNORMAL LOW (ref 8.9–10.3)
Chloride: 104 mmol/L (ref 98–111)
Creatinine, Ser: 0.96 mg/dL (ref 0.44–1.00)
GFR calc Af Amer: >60 mL/min (ref >60)
GFR calc non Af Amer: 53 mL/min — ABNORMAL LOW (ref >60)
Glucose, Bld: 95 mg/dL (ref 70–99)
Potassium: 3.6 mmol/L (ref 3.5–5.1)
Sodium: 138 mmol/L (ref 135–145)
Total Bilirubin: 1.2 mg/dL (ref 0.3–1.2)
Total Protein: 6.5 g/dL (ref 6.5–8.1)

## 2019-09-29 LAB — CBC WITH DIFFERENTIAL/PLATELET
Abs Immature Granulocytes: 0.02 10*3/uL (ref 0.00–0.07)
Basophils Absolute: 0 10*3/uL (ref 0.0–0.1)
Basophils Relative: 0 %
Eosinophils Absolute: 0.1 10*3/uL (ref 0.0–0.5)
Eosinophils Relative: 2 %
HCT: 38.1 % (ref 36.0–46.0)
Hemoglobin: 12.3 g/dL (ref 12.0–15.0)
Immature Granulocytes: 0 %
Lymphocytes Relative: 19 %
Lymphs Abs: 0.9 10*3/uL (ref 0.7–4.0)
MCH: 31.5 pg (ref 26.0–34.0)
MCHC: 32.3 g/dL (ref 30.0–36.0)
MCV: 97.7 fL (ref 80.0–100.0)
Monocytes Absolute: 0.3 10*3/uL (ref 0.1–1.0)
Monocytes Relative: 6 %
Neutro Abs: 3.5 10*3/uL (ref 1.7–7.7)
Neutrophils Relative %: 73 %
Platelets: 218 10*3/uL (ref 150–400)
RBC: 3.9 MIL/uL (ref 3.87–5.11)
RDW: 14.3 % (ref 11.5–15.5)
WBC: 4.9 10*3/uL (ref 4.0–10.5)
nRBC: 0 % (ref 0.0–0.2)

## 2019-09-29 LAB — TYPE AND SCREEN
ABO/RH(D): O POS
Antibody Screen: NEGATIVE

## 2019-09-29 LAB — POC OCCULT BLOOD, ED: Fecal Occult Bld: POSITIVE — AB

## 2019-09-29 MED ORDER — IOHEXOL 300 MG/ML  SOLN
100.0000 mL | Freq: Once | INTRAMUSCULAR | Status: AC | PRN
  Administered 2019-09-29: 80 mL via INTRAVENOUS

## 2019-09-29 MED ORDER — SODIUM CHLORIDE (PF) 0.9 % IJ SOLN
INTRAMUSCULAR | Status: AC
  Filled 2019-09-29: qty 50

## 2019-09-29 NOTE — ED Triage Notes (Signed)
EMS reports from [facility], staff called for dark stool x 2 days. Hx of Dementia @ baseline.  BP 130/68 HR 68 RR 12 Sp02 96 RA Temp 99.0

## 2019-09-29 NOTE — ED Provider Notes (Signed)
[facility] DEPT Provider Note   CSN: [id number] Arrival date & time: 09/29/19  1148     History Chief Complaint  Patient presents with  . GI Bleeding    Pamela Callahan is a 86 y.o. female with PMH/o anxiety, depression, diverticulitis, GERD, HLD, HTN brought in from EMS from [facility] who presents for evaluation of black stools x 2 days. Most of the history is provided from the nursing home who reports that for the last 2 days she has had loose, dark stools. Today, while the nurse was changing her, they noticed dark coffee ground like stools.   The history is provided by the nursing home.   EM LEVEL 5 CAVEAT DUE TO DEMENTIA     Past Medical History:  Diagnosis Date  . Allergy   . Anxiety   . Depression   . Diverticulitis   . GERD (gastroesophageal reflux disease)   . Hyperlipidemia   . Hypertension   . Low back pain   . Macular degeneration of left eye   . Osteopenia   . PAC (premature atrial contraction)     Patient Active Problem List   Diagnosis Date Noted  . Gastrointestinal hemorrhage   . Advanced care planning/counseling discussion   . Goals of care, counseling/discussion   . Palliative care by specialist   . Symptomatic anemia   . Syncope and collapse 08/23/2018  . Iron deficiency anemia due to chronic blood loss 08/23/2018  . Lactic acidosis 08/23/2018  . Seizure ([place]) 08/23/2018  . Severe dementia ([place]) 02/12/2017  . Fall 02/11/2017  . Depression with anxiety 02/11/2017  . Closed L2 vertebral fracture ([place]) 02/11/2017  . Traumatic epidural hematoma 02/11/2017  . Memory loss 03/26/2016  . Osteoarthritis of left knee 12/25/2015  . Hearing loss of aging 12/25/2015  . Cough 08/06/2015  . Leg weakness, bilateral 07/01/2015  . Subacute confusional state 07/01/2015  . B12 deficiency 09/17/2014  . Hyponatremia 08/30/2014  . Fatigue 08/30/2014  . Hip hematoma, right 06/12/2014  . Rash and nonspecific  skin eruption 03/12/2014  . PAC (premature atrial contraction) 03/23/2013  . Heart murmur 03/06/2013  . RBBB (right bundle branch block) 03/06/2013  . NEOPLASM OF UNCERTAIN BEHAVIOR OF SKIN 09/09/2010  . SOLAR KERATOSIS 03/24/2010  . ALLERGIC RHINITIS 09/26/2008  . Dyslipidemia 01/02/2008  . Anxiety state 01/02/2008  . GERD 01/02/2008  . Situational depression 09/26/2007  . Essential hypertension 09/26/2007  . OSTEOARTHRITIS 09/26/2007  . LOW BACK PAIN 09/26/2007  . OSTEOPENIA 09/26/2007    Past Surgical History:  Procedure Laterality Date  . BACK SURGERY    . CHOLECYSTECTOMY  2006  . TOTAL HIP ARTHROPLASTY  2008   Right     OB History   No obstetric history on file.     Family History  Problem Relation Age of Onset  . Hypertension Mother   . Hypertension Other     Social History   Tobacco Use  . Smoking status: Never Smoker  . Smokeless tobacco: Never Used  Substance Use Topics  . Alcohol use: No  . Drug use: No    Home Medications Prior to Admission medications   Medication Sig Start Date End Date Taking? Authorizing Provider  acetaminophen (TYLENOL) 500 MG tablet Take 1,000 mg by mouth 3 (three) times daily.   Yes [provider]  aspirin 81 MG chewable tablet Chew 81 mg by mouth daily.   Yes [provider]  citalopram (CELEXA) 10 MG tablet Take 10  mg by mouth daily.    Yes [provider]  colestipol (COLESTID) 5 g packet Take 5 g by mouth daily.   Yes [provider]  donepezil (ARICEPT) 10 MG tablet Take 1 tablet (10 mg total) by mouth at bedtime. 04/14/16  Yes Plotnikov, Evie Lacks, MD  ferrous sulfate 325 (65 FE) MG tablet Take 325 mg by mouth daily with breakfast.   Yes [provider]  fexofenadine (ALLEGRA) 180 MG tablet Take 1 tablet (180 mg total) by mouth daily. 12/21/11  Yes Plotnikov, Evie Lacks, MD  fluticasone (FLONASE) 50 MCG/ACT nasal spray Place 1 spray into both nostrils daily. As needed Patient  taking differently: Place 1 spray into both nostrils daily as needed for allergies.  09/11/13  Yes Plotnikov, Evie Lacks, MD  hydrochlorothiazide (HYDRODIURIL) 25 MG tablet Take 25 mg by mouth daily.   Yes [provider]  LORazepam (ATIVAN) 0.5 MG tablet Take 0.5 mg by mouth 2 (two) times daily.    Yes [provider]  magnesium gluconate (MAGONATE) 500 MG tablet Take 500 mg by mouth daily.   Yes [provider]  Multiple Vitamins-Minerals (PRESERVISION AREDS 2) CHEW Chew 1 tablet by mouth daily.   Yes [provider]  NONFORMULARY OR COMPOUNDED ITEM Apply 1 application topically See admin instructions. Lorazepam gel 2 mg/ml: Apply 1 ml to inner wrist three times a day as needed for agitation or anxiety   Yes [provider]  olmesartan (BENICAR) 20 MG tablet Take 1 tablet (20 mg total) by mouth daily. 03/26/16  Yes Plotnikov, Evie Lacks, MD  pantoprazole (PROTONIX) 40 MG tablet Take 1 tablet (40 mg total) by mouth daily. 08/25/18  Yes Thurnell Lose, MD  polyethylene glycol (MIRALAX / GLYCOLAX) packet Take 17 g by mouth daily. 08/25/18  Yes Thurnell Lose, MD  risperiDONE (RISPERDAL) 0.25 MG tablet Take 0.25 mg by mouth every 12 (twelve) hours as needed ("for behaviors," per [facility]).   Yes [provider]  triamcinolone (KENALOG) 0.025 % cream Apply 1 application topically 2 (two) times daily as needed (to rash).   Yes [provider]  vitamin B-12 (CYANOCOBALAMIN) 1000 MCG tablet Take 1 tablet (1,000 mcg total) by mouth daily. 12/25/15  Yes Plotnikov, Evie Lacks, MD  divalproex (DEPAKOTE) 250 MG DR tablet Take 1 tablet (250 mg total) by mouth 3 (three) times daily. Take 250 mg by mouth three times a day- lunch, supper, and bedtime Patient not taking: Reported on 09/03/2019 08/25/18   Thurnell Lose, MD  loperamide (IMODIUM A-D) 2 MG tablet Take 1 tablet (2 mg total) by mouth 3 (three) times daily as needed for diarrhea or loose  stools. Patient not taking: Reported on 09/29/2019 08/25/18   Thurnell Lose, MD    Allergies    Latex, Ace inhibitors, Penicillins, and Yellow dye  Review of Systems   Review of Systems  Unable to perform ROS: Dementia    Physical Exam Updated Vital Signs BP (!) 117/50   Pulse 68   Temp 98.2 F (36.8 C) (Rectal)   Resp 18   SpO2 99%   Physical Exam Vitals and nursing note reviewed.  Constitutional:      Appearance: Normal appearance. She is well-developed.     Comments: Frail and elderly appearing.   HENT:     Head: Normocephalic and atraumatic.  Eyes:     General: Lids are normal.     Conjunctiva/sclera: Conjunctivae normal.     Pupils: Pupils are  equal, round, and reactive to light.  Cardiovascular:     Rate and Rhythm: Normal rate and regular rhythm.     Pulses: Normal pulses.     Heart sounds: Normal heart sounds. No murmur. No friction rub. No gallop.   Pulmonary:     Effort: Pulmonary effort is normal.     Breath sounds: Normal breath sounds.  Abdominal:     Palpations: Abdomen is soft. Abdomen is not rigid.     Tenderness: There is no abdominal tenderness. There is no guarding.     Comments: Abdomen is soft, non-distended, non-tender. No rigidity, No guarding. No peritoneal signs.  Genitourinary:    Comments: The exam was performed with a chaperone present. Normal external female genitalia. No lesions, rash, or sores. Dark green with coffee ground like appearance stools.  Musculoskeletal:        General: Normal range of motion.     Cervical back: Full passive range of motion without pain.  Skin:    General: Skin is warm and dry.     Capillary Refill: Capillary refill takes less than 2 seconds.  Neurological:     Mental Status: She is alert.  Psychiatric:        Speech: Speech normal.     ED Results / Procedures / Treatments   Labs (all labs ordered are listed, but only abnormal results are displayed) Labs Reviewed  COMPREHENSIVE METABOLIC PANEL -  Abnormal; Notable for the following components:      Result Value   Calcium 8.5 (*)    GFR calc non Af Amer 53 (*)    All other components within normal limits  POC OCCULT BLOOD, ED - Abnormal; Notable for the following components:   Fecal Occult Bld POSITIVE (*)    All other components within normal limits  CBC WITH DIFFERENTIAL/PLATELET  TYPE AND SCREEN    EKG EKG Interpretation  Date/Time:  Saturday September 29 2019 12:07:19 EST Ventricular Rate:  66 PR Interval:    QRS Duration: 140 QT Interval:  450 QTC Calculation: 472 R Axis:   -62 Text Interpretation: Sinus rhythm Short PR interval RBBB and LAFB No significant change since last tracing Confirmed by Theotis Burrow [id number]) on 09/29/2019 12:38:02 PM   Radiology CT Abdomen Pelvis W Contrast  Result Date: 09/29/2019 CLINICAL DATA:  86 year old female with abdominal pain and melena EXAM: CT ABDOMEN AND PELVIS WITH CONTRAST TECHNIQUE: Multidetector CT imaging of the abdomen and pelvis was performed using the standard protocol following bolus administration of intravenous contrast. CONTRAST:  86mL OMNIPAQUE IOHEXOL 300 MG/ML  SOLN COMPARISON:  Prior CT imaging of the lumbar and thoracic spine 08/21/2017 FINDINGS: Lower chest: No acute abnormality. Hepatobiliary: No focal liver abnormality is seen. Status post cholecystectomy. No significant biliary dilatation. Pancreas: Unremarkable. No pancreatic ductal dilatation or surrounding inflammatory changes. Spleen: Normal in size without focal abnormality. Adrenals/Urinary Tract: No adrenal nodules. No evidence of hydronephrosis, nephrolithiasis or enhancing renal mass. Several small circumscribed low-attenuation renal lesions are present bilaterally consistent with simple cysts. The ureters are unremarkable. The bladder is grossly unremarkable although very poorly seen secondary to extensive streak artifact from bilateral hip arthroplasty prostheses. Stomach/Bowel: Extensive colonic  diverticulosis without evidence of active inflammation to suggest acute diverticulitis. No focal bowel wall thickening or evidence of obstruction. Limited evaluation of the bowel within the anatomic pelvis secondary to streak artifact. Vascular/Lymphatic: Mild ectasia of the celiac axis measuring up to 1.2 cm. 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 Reproductive: Obscured by extensive streak artifact from bilateral hip arthroplasty prostheses. Other: No abdominal wall hernia or abnormality. No abdominopelvic ascites. Musculoskeletal: Severe L2 compression fracture with vertebral plana and approximately 8 mm posterior retropulsion remains unchanged compared to prior imaging from January of 2019. New severe T12 compression fracture with near vertebra plana appearance has developed in the interim since the prior imaging. However, there are no focal lucencies to suggest that this represents an acute fracture. Bilateral hip arthroplasty prostheses. Remote healed fracture of the right inferior pubic ramus. Remote healed bilateral proximal femoral fractures. IMPRESSION: 1. Extensive colonic diverticulosis without evidence of active inflammation. 2. Limited visualization of the contents of the anatomic pelvis secondary to extensive streak artifact related to bilateral hip arthroplasty prostheses. 3. Stable chronic L2 fracture with significant retropulsion. 4. New T12 compression fracture with near vertebra plana appearance compared to prior imaging from January of 2019. However, while this has occurred at some point in the interim, there are no lucencies on today's imaging to suggest that this is an acute process. Electronically Signed   By: Jacqulynn Cadet M.D.   On: 09/29/2019 13:35    Procedures Procedures (including critical care time)  Medications Ordered in ED Medications  sodium chloride (PF) 0.9 % injection (has no administration in time range)  iohexol (OMNIPAQUE) 300  MG/ML solution 100 mL (80 mLs Intravenous Contrast Given 09/29/19 1302)    ED Course  I have reviewed the triage vital signs and the nursing notes.  Pertinent labs & imaging results that were available during my care of the patient were reviewed by me and considered in my medical decision making (see chart for details).    MDM Rules/Calculators/A&P                      86 y.o. F  PMH/o anxiety, depression, diverticulitis, GERD, HLD, HTN who presents from [facility] for evaluation of 2 days of loose, dark stools.  Per Ivette (RN at [facility]) patient had loose stools that were slightly darker in nature for the last 2 days.  This morning, she went to change patient noticed coffee-ground-like stools and brought her to the emergency department for further evaluation.  She does report the patient has a history of dementia and states that this is baseline for patient.  On initial arrival, she is afebrile, elderly, frail-appearing.  Vitals are stable.  On exam, she has dark greenish/black stool with small normal particles that look like coffee grounds.  No bright red blood.  Concern for GI bleed versus diverticulitis. Plan to check labs.  Fecal occult is positive.  CMP is normal BUN and creatinine.  CBC shows no leukocytosis.  Hemoglobin stable at 12.3.  CT on pelvis shows signs of colonic diverticulosis without any evidence of active inflammation.  There is stable chronic L2 fracture as well as new T12 fracture from prior imaging in January 2019.  I reviewed her previous records.  She had a similar episode in January 2020 where she was admitted.  Her hemoglobin was 7 at that time.  Dr. Collene Mares (GI) evaluated her at that time.  She had a discussion with her son who wanted her to be palliative care and did not want any further interventional procedures at that time.  She was discharged.  I discussed with Rakayla Wallenhorst (patien'ts son).  I discussed her work-up and presentation here in the emergency  department.  At this time, her hemoglobin is stable does not require transfusion.  I discussed with patient's son and she he states that patient is still to be palliative care and DNI/DNR.  He does not wish to have any further interventional procedures at this time.  Given discussion with son and reassuring work-up here in the ED, plan for discharge home back to [facility] for close monitoring. Discussed patient with Dr. Rex Kras who independently evaluated patient is agreeable to plan.   Portions of this note were generated with [employer]. Dictation errors may occur despite best attempts at proofreading.  Final Clinical Impression(s) / ED Diagnoses Final diagnoses:  Melena    Rx / DC Orders ED Discharge Orders    None       Desma Mcgregor 09/29/19 1531    Little, Wenda Overland, MD 09/30/19 1615

## 2019-09-29 NOTE — Discharge Instructions (Addendum)
Her fecal occult was positive.  Her lab work and hemoglobin were stable.  Closely monitor symptoms and return the emergency department for any weakness, worsening melena, difficulty breathing, vomiting or any other worsening or concerning symptoms.

## 2019-10-27 ENCOUNTER — Other Ambulatory Visit: Payer: Self-pay

## 2019-10-27 ENCOUNTER — Emergency Department (HOSPITAL_COMMUNITY): Payer: Medicare Other

## 2019-10-27 ENCOUNTER — Emergency Department (HOSPITAL_COMMUNITY)
Admission: EM | Admit: 2019-10-27 | Discharge: 2019-10-27 | Disposition: A | Discharge status: Skilled Nursing Facility | Payer: Medicare Other | Attending: Emergency Medicine | Admitting: Emergency Medicine

## 2019-10-27 DIAGNOSIS — Z043 Encounter for examination and observation following other accident: Secondary | ICD-10-CM | POA: Insufficient documentation

## 2019-10-27 DIAGNOSIS — F0390 Unspecified dementia without behavioral disturbance: Secondary | ICD-10-CM | POA: Insufficient documentation

## 2019-10-27 DIAGNOSIS — K922 Gastrointestinal hemorrhage, unspecified: Secondary | ICD-10-CM | POA: Diagnosis not present

## 2019-10-27 DIAGNOSIS — I10 Essential (primary) hypertension: Secondary | ICD-10-CM | POA: Diagnosis not present

## 2019-10-27 DIAGNOSIS — Y92129 Unspecified place in nursing home as the place of occurrence of the external cause: Secondary | ICD-10-CM | POA: Insufficient documentation

## 2019-10-27 DIAGNOSIS — Y999 Unspecified external cause status: Secondary | ICD-10-CM | POA: Diagnosis not present

## 2019-10-27 DIAGNOSIS — Y9389 Activity, other specified: Secondary | ICD-10-CM | POA: Diagnosis not present

## 2019-10-27 DIAGNOSIS — W19XXXA Unspecified fall, initial encounter: Secondary | ICD-10-CM | POA: Insufficient documentation

## 2019-10-27 LAB — CBC
HCT: 35.9 % — ABNORMAL LOW (ref 36.0–46.0)
Hemoglobin: 11.5 g/dL — ABNORMAL LOW (ref 12.0–15.0)
MCH: 31.9 pg (ref 26.0–34.0)
MCHC: 32 g/dL (ref 30.0–36.0)
MCV: 99.4 fL (ref 80.0–100.0)
Platelets: 251 K/uL (ref 150–400)
RBC: 3.61 MIL/uL — ABNORMAL LOW (ref 3.87–5.11)
RDW: 14.6 % (ref 11.5–15.5)
WBC: 6.2 K/uL (ref 4.0–10.5)
nRBC: 0 % (ref 0.0–0.2)

## 2019-10-27 LAB — BASIC METABOLIC PANEL
Anion gap: 11 (ref 5–15)
BUN: 30 mg/dL — ABNORMAL HIGH (ref 8–23)
CO2: 27 mmol/L (ref 22–32)
Calcium: 8.7 mg/dL — ABNORMAL LOW (ref 8.9–10.3)
Chloride: 106 mmol/L (ref 98–111)
Creatinine, Ser: 0.93 mg/dL (ref 0.44–1.00)
GFR calc Af Amer: >60 mL/min (ref >60)
GFR calc non Af Amer: 55 mL/min — ABNORMAL LOW (ref >60)
Glucose, Bld: 104 mg/dL — ABNORMAL HIGH (ref 70–99)
Potassium: 3.9 mmol/L (ref 3.5–5.1)
Sodium: 144 mmol/L (ref 135–145)

## 2019-10-27 MED ORDER — SODIUM CHLORIDE 0.9 % IV BOLUS (SEPSIS)
500.0000 mL | Freq: Once | INTRAVENOUS | Status: AC
  Administered 2019-10-27: 500 mL via INTRAVENOUS

## 2019-10-27 NOTE — ED Triage Notes (Signed)
Pt BIBA from [facility]-  Per EMS- Initially called out for unwitnessed fall.  EMS reports pt was sitting on ground on their arrival. Hx of GI bleed since January. Pt at baseline mentally per nursing home staff. EMS also report hypotension.  (100/70). Pt uses wheelchair at baseline.   100/70

## 2019-10-27 NOTE — Discharge Instructions (Addendum)
Tests showed no life-threatening condition.  Can return to nursing home.

## 2019-10-27 NOTE — ED Provider Notes (Signed)
[facility] DEPT Provider Note   CSN: [id number] Arrival date & time: 10/27/19  1425     History Fall  Pamela Callahan is a 86 y.o. female.  HPI   Pt presented to the ED for evaluation after a fall.   Patient is a resident of a nursing facility.  She has advanced dementia.  Patient also has had issues with chronic intestinal bleeding.  She was seen back in the emergency room on February 27.  At that time they noted that she had blood in her stool at the nursing facility.  Patient was worked up in the ED including a CT scan.  Patient was Hemoccult positive.  Per the notes case was discussed with the patient's family and they did not want further intervention of her intestinal bleeding.  She was discharged back to the nursing facility.  Per the EMS notes, patient was found sitting on the ground at the nursing facility.  Staff indicated she had an unwitnessed fall.  There is no indication of any injury.  Per the facility, the patient was at her baseline mentally.  EMS noted that her blood pressure was 100/70 and she was transported to the ED for evaluation.  Patient herself is unable to provide any history to me.  She does look at me and will cooperate with getting dressed but does not answer any questions appropriately  Past Medical History:  Diagnosis Date  . Allergy   . Anxiety   . Depression   . Diverticulitis   . GERD (gastroesophageal reflux disease)   . Hyperlipidemia   . Hypertension   . Low back pain   . Macular degeneration of left eye   . Osteopenia   . PAC (premature atrial contraction)     Patient Active Problem List   Diagnosis Date Noted  . Gastrointestinal hemorrhage   . Advanced care planning/counseling discussion   . Goals of care, counseling/discussion   . Palliative care by specialist   . Symptomatic anemia   . Syncope and collapse 08/23/2018  . Iron deficiency anemia due to chronic blood loss 08/23/2018  . Lactic acidosis  08/23/2018  . Seizure ([place]) 08/23/2018  . Severe dementia ([place]) 02/12/2017  . Fall 02/11/2017  . Depression with anxiety 02/11/2017  . Closed L2 vertebral fracture ([place]) 02/11/2017  . Traumatic epidural hematoma 02/11/2017  . Memory loss 03/26/2016  . Osteoarthritis of left knee 12/25/2015  . Hearing loss of aging 12/25/2015  . Cough 08/06/2015  . Leg weakness, bilateral 07/01/2015  . Subacute confusional state 07/01/2015  . B12 deficiency 09/17/2014  . Hyponatremia 08/30/2014  . Fatigue 08/30/2014  . Hip hematoma, right 06/12/2014  . Rash and nonspecific skin eruption 03/12/2014  . PAC (premature atrial contraction) 03/23/2013  . Heart murmur 03/06/2013  . RBBB (right bundle branch block) 03/06/2013  . NEOPLASM OF UNCERTAIN BEHAVIOR OF SKIN 09/09/2010  . SOLAR KERATOSIS 03/24/2010  . ALLERGIC RHINITIS 09/26/2008  . Dyslipidemia 01/02/2008  . Anxiety state 01/02/2008  . GERD 01/02/2008  . Situational depression 09/26/2007  . Essential hypertension 09/26/2007  . OSTEOARTHRITIS 09/26/2007  . LOW BACK PAIN 09/26/2007  . OSTEOPENIA 09/26/2007    Past Surgical History:  Procedure Laterality Date  . BACK SURGERY    . CHOLECYSTECTOMY  2006  . TOTAL HIP ARTHROPLASTY  2008   Right     OB History   No obstetric history on file.     Family History  Problem Relation Age of Onset  .  Hypertension Mother   . Hypertension Other     Social History   Tobacco Use  . Smoking status: Never Smoker  . Smokeless tobacco: Never Used  Substance Use Topics  . Alcohol use: No  . Drug use: No    Home Medications Prior to Admission medications   Medication Sig Start Date End Date Taking? Authorizing Provider  acetaminophen (TYLENOL) 500 MG tablet Take 1,000 mg by mouth 3 (three) times daily.    [provider]  aspirin 81 MG chewable tablet Chew 81 mg by mouth daily.    [provider]  citalopram (CELEXA) 10 MG tablet Take 10 mg by mouth daily.     [provider]  colestipol (COLESTID) 5 g packet Take 5 g by mouth daily.    [provider]  divalproex (DEPAKOTE) 250 MG DR tablet Take 1 tablet (250 mg total) by mouth 3 (three) times daily. Take 250 mg by mouth three times a day- lunch, supper, and bedtime Patient not taking: Reported on 09/03/2019 08/25/18   Thurnell Lose, MD  donepezil (ARICEPT) 10 MG tablet Take 1 tablet (10 mg total) by mouth at bedtime. 04/14/16   Plotnikov, Evie Lacks, MD  ferrous sulfate 325 (65 FE) MG tablet Take 325 mg by mouth daily with breakfast.    [provider]  fexofenadine (ALLEGRA) 180 MG tablet Take 1 tablet (180 mg total) by mouth daily. 12/21/11   Plotnikov, Evie Lacks, MD  fluticasone (FLONASE) 50 MCG/ACT nasal spray Place 1 spray into both nostrils daily. As needed Patient taking differently: Place 1 spray into both nostrils daily as needed for allergies.  09/11/13   Plotnikov, Evie Lacks, MD  hydrochlorothiazide (HYDRODIURIL) 25 MG tablet Take 25 mg by mouth daily.    [provider]  loperamide (IMODIUM A-D) 2 MG tablet Take 1 tablet (2 mg total) by mouth 3 (three) times daily as needed for diarrhea or loose stools. Patient not taking: Reported on 09/29/2019 08/25/18   Thurnell Lose, MD  LORazepam (ATIVAN) 0.5 MG tablet Take 0.5 mg by mouth 2 (two) times daily.     [provider]  magnesium gluconate (MAGONATE) 500 MG tablet Take 500 mg by mouth daily.    [provider]  Multiple Vitamins-Minerals (PRESERVISION AREDS 2) CHEW Chew 1 tablet by mouth daily.    [provider]  NONFORMULARY OR COMPOUNDED ITEM Apply 1 application topically See admin instructions. Lorazepam gel 2 mg/ml: Apply 1 ml to inner wrist three times a day as needed for agitation or anxiety    [provider]  olmesartan (BENICAR) 20 MG tablet Take 1 tablet (20 mg total) by mouth daily. 03/26/16   Plotnikov, Evie Lacks, MD  pantoprazole (PROTONIX) 40 MG tablet Take 1 tablet  (40 mg total) by mouth daily. 08/25/18   Thurnell Lose, MD  polyethylene glycol (MIRALAX / GLYCOLAX) packet Take 17 g by mouth daily. 08/25/18   Thurnell Lose, MD  risperiDONE (RISPERDAL) 0.25 MG tablet Take 0.25 mg by mouth every 12 (twelve) hours as needed ("for behaviors," per [facility]).    [provider]  triamcinolone (KENALOG) 0.025 % cream Apply 1 application topically 2 (two) times daily as needed (to rash).    [provider]  vitamin B-12 (CYANOCOBALAMIN) 1000 MCG tablet Take 1 tablet (1,000 mcg total) by mouth daily. 12/25/15   Plotnikov, Evie Lacks, MD    Allergies    Latex, Ace inhibitors, Penicillins, and Yellow dye  Review  of Systems   Review of Systems  Unable to perform ROS: Mental status change    Physical Exam Updated Vital Signs BP 105/67   Pulse 85   Temp 97.8 F (36.6 C) (Axillary)   SpO2 93%   Physical Exam Vitals and nursing note reviewed.  Constitutional:      Appearance: She is well-developed. She is not ill-appearing or diaphoretic.     Comments: Elderly, frail  HENT:     Head: Normocephalic and atraumatic.     Right Ear: External ear normal.     Left Ear: External ear normal.  Eyes:     General: No scleral icterus.       Right eye: No discharge.        Left eye: No discharge.     Conjunctiva/sclera: Conjunctivae normal.  Neck:     Trachea: No tracheal deviation.  Cardiovascular:     Rate and Rhythm: Normal rate and regular rhythm.  Pulmonary:     Effort: Pulmonary effort is normal. No respiratory distress.     Breath sounds: Normal breath sounds. No stridor. No wheezing or rales.  Abdominal:     General: Bowel sounds are normal. There is no distension.     Palpations: Abdomen is soft.     Tenderness: There is no abdominal tenderness. There is no guarding or rebound.  Musculoskeletal:        General: No tenderness.     Right shoulder: No swelling, tenderness or bony tenderness.     Left shoulder: No swelling, tenderness  or bony tenderness.     Right wrist: No swelling, tenderness or bony tenderness.     Left wrist: No swelling, tenderness or bony tenderness.     Cervical back: Neck supple. No swelling, tenderness or bony tenderness.     Thoracic back: No swelling, tenderness or bony tenderness.     Lumbar back: No swelling, tenderness or bony tenderness.     Right hip: No tenderness or bony tenderness. Normal range of motion.     Left hip: No tenderness or bony tenderness. Normal range of motion.     Right ankle: No swelling. No tenderness.     Left ankle: No swelling. No tenderness.  Skin:    General: Skin is warm and dry.     Findings: No rash.  Neurological:     Mental Status: She is alert.     Cranial Nerves: No cranial nerve deficit (no facial droop, extraocular movements intact, no slurred speech).     Sensory: No sensory deficit.     Motor: No abnormal muscle tone or seizure activity.     Coordination: Coordination normal.     ED Results / Procedures / Treatments   Labs (all labs ordered are listed, but only abnormal results are displayed) Labs Reviewed  CBC  BASIC METABOLIC PANEL    EKG None  Radiology No results found.  Procedures Procedures (including critical care time)  Medications Ordered in ED Medications  sodium chloride 0.9 % bolus 500 mL (has no administration in time range)    ED Course  I have reviewed the triage vital signs and the nursing notes.  Pertinent labs & imaging results that were available during my care of the patient were reviewed by me and considered in my medical decision making (see chart for details).    MDM Rules/Calculators/A&P                      No signs  of obvious injury.  Pt appears to be at her baseline mentally.  Known history of GI bleeding without plans for further intervention per recent notes.  Will check labs, pelvis xray.   Care turned over to Dr Lacinda Axon Final Clinical Impression(s) / ED Diagnoses Final diagnoses:  Fall, initial  encounter      Dorie Rank, MD 10/27/19 [phone]

## 2019-10-27 NOTE — ED Notes (Signed)
[facility] notified of pts need for transport.  

## 2019-10-27 NOTE — ED Notes (Signed)
Report called to Shirlean Mylar, RN at [facility] facility

## 2019-10-27 NOTE — ED Notes (Signed)
Pt assisted to dress back in her own clothes after providing peri care and placing her in a clean brief.    Pt provided dinner tray and cup of water.  This RN monitored pt for a few minutes to ensure pt able to swallow without coughing.  Pt able to easily swallow both food and water, no coughing noted.

## 2019-10-27 NOTE — ED Provider Notes (Signed)
Tests reviewed.  Patient is stable for outpatient management.   Nat Christen, MD 10/27/19 [facility]

## 2020-09-08 ENCOUNTER — Emergency Department (HOSPITAL_COMMUNITY)
Admission: EM | Admit: 2020-09-08 | Discharge: 2020-09-08 | Disposition: A | Discharge status: Home / Self Care | Payer: Medicare Other | Attending: Emergency Medicine | Admitting: Emergency Medicine

## 2020-09-08 DIAGNOSIS — Z96641 Presence of right artificial hip joint: Secondary | ICD-10-CM | POA: Diagnosis not present

## 2020-09-08 DIAGNOSIS — Y92129 Unspecified place in nursing home as the place of occurrence of the external cause: Secondary | ICD-10-CM | POA: Insufficient documentation

## 2020-09-08 DIAGNOSIS — W19XXXA Unspecified fall, initial encounter: Secondary | ICD-10-CM | POA: Diagnosis not present

## 2020-09-08 DIAGNOSIS — S01112A Laceration without foreign body of left eyelid and periocular area, initial encounter: Secondary | ICD-10-CM | POA: Diagnosis present

## 2020-09-08 DIAGNOSIS — Z91040 Latex allergy status: Secondary | ICD-10-CM | POA: Diagnosis not present

## 2020-09-08 DIAGNOSIS — F0390 Unspecified dementia without behavioral disturbance: Secondary | ICD-10-CM | POA: Insufficient documentation

## 2020-09-08 DIAGNOSIS — I10 Essential (primary) hypertension: Secondary | ICD-10-CM | POA: Diagnosis not present

## 2020-09-08 DIAGNOSIS — Z79899 Other long term (current) drug therapy: Secondary | ICD-10-CM | POA: Diagnosis not present

## 2020-09-08 DIAGNOSIS — Z7982 Long term (current) use of aspirin: Secondary | ICD-10-CM | POA: Insufficient documentation

## 2020-09-08 MED ORDER — RISPERIDONE 0.5 MG PO TABS
0.2500 mg | ORAL_TABLET | Freq: Once | ORAL | Status: AC
  Administered 2020-09-08: 0.25 mg via ORAL
  Filled 2020-09-08: qty 1

## 2020-09-08 MED ORDER — LIDOCAINE-EPINEPHRINE 1 %-1:100000 IJ SOLN
10.0000 mL | Freq: Once | INTRAMUSCULAR | Status: AC
  Administered 2020-09-08: 10 mL
  Filled 2020-09-08: qty 1

## 2020-09-08 MED ORDER — LIDOCAINE-EPINEPHRINE-TETRACAINE (LET) SOLUTION
3.0000 mL | Freq: Once | NASAL | Status: AC
  Administered 2020-09-08: 3 mL via TOPICAL
  Filled 2020-09-08 (×2): qty 3

## 2020-09-08 NOTE — ED Triage Notes (Signed)
BIB by [facility] after [facility] called to report that pt had an unwitnessed fall with unknown down time. Pt sustained left sided lac above eye. Pt has hx of Dementia. Facility denies thinners.

## 2020-09-08 NOTE — ED Notes (Signed)
Report called to [facility] 

## 2020-09-08 NOTE — ED Provider Notes (Signed)
[facility] EMERGENCY DEPARTMENT Provider Note   CSN: [id number] Arrival date & time: 09/08/20  2703     History Chief Complaint  Patient presents with  . Fall    Pamela Callahan is a 87 y.o. female who presents via EMS after unwitnessed fall at [employer]. Patient was down for unknown amount of time but is alert and interactive at this time. Patient with advanced dementia, on comfort care only. Patient is not on any anticoagulation. She has a laceration to her left eyebrow, hemostatic; collateral history obtained from the patient's son, Pamela Callahan. He states that patient is comfort care only, and the family only allowed his mother to be brought to the ED today because she has a laceration that cannot be repaired at her facility. He states that if she did not have this laceration, they would not have allowed her to be brought to the ED, as they would not have taken any action on any test results regardless.  LEVEL 5 CAVEAT, patient with advanced dementia.   HPI     Past Medical History:  Diagnosis Date  . Allergy   . Anxiety   . Depression   . Diverticulitis   . GERD (gastroesophageal reflux disease)   . Hyperlipidemia   . Hypertension   . Low back pain   . Macular degeneration of left eye   . Osteopenia   . PAC (premature atrial contraction)     Patient Active Problem List   Diagnosis Date Noted  . Gastrointestinal hemorrhage   . Advanced care planning/counseling discussion   . Goals of care, counseling/discussion   . Palliative care by specialist   . Symptomatic anemia   . Syncope and collapse 08/23/2018  . Iron deficiency anemia due to chronic blood loss 08/23/2018  . Lactic acidosis 08/23/2018  . Seizure ([place]) 08/23/2018  . Severe dementia ([place]) 02/12/2017  . Fall 02/11/2017  . Depression with anxiety 02/11/2017  . Closed L2 vertebral fracture ([place]) 02/11/2017  . Traumatic epidural hematoma 02/11/2017  . Memory loss 03/26/2016   . Osteoarthritis of left knee 12/25/2015  . Hearing loss of aging 12/25/2015  . Cough 08/06/2015  . Leg weakness, bilateral 07/01/2015  . Subacute confusional state 07/01/2015  . B12 deficiency 09/17/2014  . Hyponatremia 08/30/2014  . Fatigue 08/30/2014  . Hip hematoma, right 06/12/2014  . Rash and nonspecific skin eruption 03/12/2014  . PAC (premature atrial contraction) 03/23/2013  . Heart murmur 03/06/2013  . RBBB (right bundle branch block) 03/06/2013  . NEOPLASM OF UNCERTAIN BEHAVIOR OF SKIN 09/09/2010  . SOLAR KERATOSIS 03/24/2010  . ALLERGIC RHINITIS 09/26/2008  . Dyslipidemia 01/02/2008  . Anxiety state 01/02/2008  . GERD 01/02/2008  . Situational depression 09/26/2007  . Essential hypertension 09/26/2007  . OSTEOARTHRITIS 09/26/2007  . LOW BACK PAIN 09/26/2007  . OSTEOPENIA 09/26/2007    Past Surgical History:  Procedure Laterality Date  . BACK SURGERY    . CHOLECYSTECTOMY  2006  . TOTAL HIP ARTHROPLASTY  2008   Right     OB History   No obstetric history on file.     Family History  Problem Relation Age of Onset  . Hypertension Mother   . Hypertension Other     Social History   Tobacco Use  . Smoking status: Never Smoker  . Smokeless tobacco: Never Used  Substance Use Topics  . Alcohol use: No  . Drug use: No    Home Medications Prior to Admission medications  Medication Sig Start Date End Date Taking? Authorizing Provider  acetaminophen (TYLENOL) 500 MG tablet Take 1,000 mg by mouth 3 (three) times daily.    [provider]  aspirin 81 MG chewable tablet Chew 81 mg by mouth daily.    [provider]  citalopram (CELEXA) 10 MG tablet Take 10 mg by mouth daily.     [provider]  colestipol (COLESTID) 5 g packet Take 5 g by mouth daily.    [provider]  divalproex (DEPAKOTE) 250 MG DR tablet Take 1 tablet (250 mg total) by mouth 3 (three) times daily. Take 250 mg by mouth three times a day- lunch,  supper, and bedtime Patient not taking: Reported on 09/03/2019 08/25/18   Thurnell Lose, MD  donepezil (ARICEPT) 10 MG tablet Take 1 tablet (10 mg total) by mouth at bedtime. 04/14/16   Plotnikov, Evie Lacks, MD  ferrous sulfate 325 (65 FE) MG tablet Take 325 mg by mouth daily with breakfast.    [provider]  fexofenadine (ALLEGRA) 180 MG tablet Take 1 tablet (180 mg total) by mouth daily. 12/21/11   Plotnikov, Evie Lacks, MD  fluticasone (FLONASE) 50 MCG/ACT nasal spray Place 1 spray into both nostrils daily. As needed Patient taking differently: Place 1 spray into both nostrils daily as needed for allergies.  09/11/13   Plotnikov, Evie Lacks, MD  hydrochlorothiazide (HYDRODIURIL) 25 MG tablet Take 25 mg by mouth daily.    [provider]  loperamide (IMODIUM A-D) 2 MG tablet Take 1 tablet (2 mg total) by mouth 3 (three) times daily as needed for diarrhea or loose stools. Patient not taking: Reported on 09/29/2019 08/25/18   Thurnell Lose, MD  LORazepam (ATIVAN) 0.5 MG tablet Take 0.5 mg by mouth 2 (two) times daily.     [provider]  magnesium gluconate (MAGONATE) 500 MG tablet Take 500 mg by mouth daily.    [provider]  Multiple Vitamins-Minerals (PRESERVISION AREDS 2) CHEW Chew 1 tablet by mouth daily.    [provider]  NONFORMULARY OR COMPOUNDED ITEM Apply 1 application topically See admin instructions. Lorazepam gel 2 mg/ml: Apply 1 ml to inner wrist three times a day as needed for agitation or anxiety    [provider]  olmesartan (BENICAR) 20 MG tablet Take 1 tablet (20 mg total) by mouth daily. 03/26/16   Plotnikov, Evie Lacks, MD  pantoprazole (PROTONIX) 40 MG tablet Take 1 tablet (40 mg total) by mouth daily. 08/25/18   Thurnell Lose, MD  polyethylene glycol (MIRALAX / GLYCOLAX) packet Take 17 g by mouth daily. 08/25/18   Thurnell Lose, MD  risperiDONE (RISPERDAL) 0.25 MG tablet Take 0.25 mg by mouth every 12 (twelve)  hours as needed ("for behaviors," per [facility]).    [provider]  triamcinolone (KENALOG) 0.025 % cream Apply 1 application topically 2 (two) times daily as needed (to rash).    [provider]  vitamin B-12 (CYANOCOBALAMIN) 1000 MCG tablet Take 1 tablet (1,000 mcg total) by mouth daily. 12/25/15   Plotnikov, Evie Lacks, MD    Allergies    Latex, Ace inhibitors, Penicillins, and Yellow dye  Review of Systems   Review of Systems  Unable to perform ROS: Dementia    Physical Exam Updated Vital Signs BP (!) 151/97 (BP Location: Left Arm)   Pulse 63   Temp 97.8 F (36.6 C) (Oral)   Resp 15   SpO2 94%   Physical Exam Vitals and  nursing note reviewed.  HENT:     Head: Laceration present. No raccoon eyes.      Nose: Nose normal.     Mouth/Throat:     Mouth: Mucous membranes are moist.     Dentition: Abnormal dentition.     Pharynx: Oropharynx is clear. Uvula midline. No oropharyngeal exudate or posterior oropharyngeal erythema.  Eyes:     General: Lids are normal.        Right eye: No discharge.        Left eye: No discharge.     Extraocular Movements: Extraocular movements intact.     Conjunctiva/sclera: Conjunctivae normal.     Pupils: Pupils are equal, round, and reactive to light.  Neck:     Trachea: Trachea normal.  Cardiovascular:     Rate and Rhythm: Normal rate and regular rhythm.     Pulses: Normal pulses.     Heart sounds: Normal heart sounds.  Pulmonary:     Effort: Pulmonary effort is normal. No respiratory distress.     Breath sounds: Normal breath sounds. No wheezing or rales.  Chest:     Chest wall: No mass, lacerations, deformity, swelling, tenderness, crepitus or edema.  Abdominal:     General: Bowel sounds are normal. There is no distension.     Palpations: Abdomen is soft.     Tenderness: There is no abdominal tenderness.  Musculoskeletal:        General: No deformity.     Cervical back: Normal range of motion and neck supple. No  rigidity, tenderness or crepitus. No pain with movement.     Right lower leg: No edema.     Left lower leg: No edema.     Comments: Patient moving all 4 extremities spontaneously and without difficulty  Lymphadenopathy:     Cervical: No cervical adenopathy.  Skin:    General: Skin is warm and dry.     Capillary Refill: Capillary refill takes less than 2 seconds.     Findings: Laceration present.  Neurological:     Mental Status: She is alert. Mental status is at baseline.     Sensory: Sensation is intact.     Motor: Motor function is intact.  Psychiatric:        Mood and Affect: Mood normal.     Comments: Patient speaking constantly, however not forming real words or coherent thoughts, at her baseline     ED Results / Procedures / Treatments   Labs (all labs ordered are listed, but only abnormal results are displayed) Labs Reviewed - No data to display  EKG EKG Interpretation  Date/Time:  Monday September 08 2020 08:35:34 EST Ventricular Rate:  59 PR Interval:    QRS Duration: 144 QT Interval:  455 QTC Calculation: 451 R Axis:   -61 Text Interpretation: Sinus rhythm Atrial premature complex Prolonged PR interval RBBB and LAFB Probable left ventricular hypertrophy No significant change since last tracing Confirmed by Theotis Burrow [phone]) on 09/08/2020 9:54:21 AM   Radiology No results found.  Procedures .Marland KitchenLaceration Repair  Date/Time: 09/08/2020 9:55 AM Performed by: Emeline Darling, PA-C Authorized by: Emeline Darling, PA-C   Consent:    Consent obtained:  Verbal   Consent given by:  Patient   Risks discussed:  Infection, need for additional repair, pain, poor cosmetic result and poor wound healing   Alternatives discussed:  No treatment and delayed treatment Universal protocol:    Procedure explained and questions answered to patient or proxy's satisfaction: yes  Relevant documents present and verified: yes     Test results available: yes      Imaging studies available: yes     Required blood products, implants, devices, and special equipment available: yes     Site/side marked: yes     Immediately prior to procedure, a time out was called: yes     Patient identity confirmed:  Verbally with patient Anesthesia:    Anesthesia method:  Local infiltration   Local anesthetic:  Lidocaine 1% WITH epi Laceration details:    Location:  Face   Face location:  L eyebrow   Length (cm):  2.5 Exploration:    Limited defect created (wound extended): no     Hemostasis achieved with:  Epinephrine and LET   Wound exploration: entire depth of wound visualized     Wound extent: no foreign bodies/material noted, no tendon damage noted and no underlying fracture noted     Contaminated: no   Treatment:    Area cleansed with:  Betadine and saline   Amount of cleaning:  Standard   Irrigation solution:  Sterile saline   Visualized foreign bodies/material removed: no   Skin repair:    Repair method:  Sutures   Suture size:  4-0   Suture material:  Chromic gut   Suture technique:  Simple interrupted   Number of sutures:  2 Approximation:    Approximation:  Close Post-procedure details:    Dressing:  Non-adherent dressing   Procedure completion:  Tolerated with difficulty Comments:     Severe dementia, patient became agitated throughout procedure, however repair was successful.      Medications Ordered in ED Medications  lidocaine-EPINEPHrine-tetracaine (LET) solution (3 mLs Topical Given 09/08/20 0901)  lidocaine-EPINEPHrine (XYLOCAINE W/EPI) 1 %-1:100000 (with pres) injection 10 mL (10 mLs Other Given 09/08/20 1018)  risperiDONE (RISPERDAL) tablet 0.25 mg (0.25 mg Oral Given 09/08/20 1023)    ED Course  I have reviewed the triage vital signs and the nursing notes.  Pertinent labs & imaging results that were available during my care of the patient were reviewed by me and considered in my medical decision making (see chart for  details).  Clinical Course as of 09/08/20 1059  Mon Sep 08, 2020  [id number] I personally spoke on the phone with the patient's emergency contact, her son Pamela Callahan. He confirmed the family's wishes to keep [facility] comfort care only. He voiced understanding of the role of a head CT in an elderly patient, as well as the risks if a CT is not performed. He expressed that the family would not take action if a bleed were identified on the scan, and subsequently voiced desire to forego the head CT at this time. Will proceed with laceration repair.  [RS]  [phone] Patient agitated following lac repair, attempting to climb out of bed and pulling clothing off, will order home dose of risperdal. Placed in trendelenburg for safety. [RS]  [id number] Tdap UTD, 2020. [RS]  1030 Patient continues to try to climb out of bed, soft waist belt ordered for patient safety.  [RS]    Clinical Course User Index [RS] Omari Koslosky, Sharlene Dory   MDM Rules/Calculators/A&P                          87 year old female who presents via EMS after unwitnessed fall at her facility.  She has advanced dementia, on comfort care.  Mildly hypertensive on intake to 151/97.  Vital signs otherwise  normal.  Physical exam reassuring.  Cardiopulmonary exam unremarkable, there is a 2.5 cm laceration to the left eyebrow, hemostatic at this time but gaping. Abdominal exam is benign. No other obvious trauma.  No areas of obvious tenderness to palpation, however patient unable to participate in exam due to underlying dementia.  She is alert and interactive, speaking quickly and incoherently.  Please see ED course above. Case discussed with POA, her son Pamela Callahan. Patient comfort care only, will not proceed with imaging or laboratory studies.  UTD on tetanus. Please see ED course above for more detail.   Laceration repaired as above, patient tolerated procedure fairly well.  Given patient is on comfort care, no further work-up warranted in the ED at this  time. Will arrange transport back to her facility at this time. Family notified. Patient is stable and appropriate for discharge at this time.   This chart was dictated using voice recognition software, Dragon. Despite the best efforts of this provider to proofread and correct errors, errors may still occur which can change documentation meaning.  Final Clinical Impression(s) / ED Diagnoses Final diagnoses:  Fall, initial encounter  Eyebrow laceration, left, initial encounter    Rx / DC Orders ED Discharge Orders    None       Aura Dials 09/08/20 1100    Little, Wenda Overland, MD 09/08/20 1351

## 2020-09-08 NOTE — ED Notes (Signed)
Help get patient undressed into a gown on the monitor patient is resting with call bell in reach did ekg shown to Dr Rex Kras

## 2020-09-08 NOTE — Discharge Instructions (Addendum)
Pamela Callahan was evaluated in the emergency department today after her fall.  She had a laceration to her left eyebrow.  This was repaired using suture: she has 2 stitches in place.  The suture that was used is absorbable and will disolve its own, she does NOT need to have the suture removed.  Per family request, as patient is comfort care no imaging studies were obtained.  Please keep your wound clean and dry, should not be submerged in water.  You may place a clean dressing on a daily.   She may return to the emergency department anytime she needs

## 2021-02-01 IMAGING — CT CT HEAD WITHOUT CONTRAST
4 of 8 series · 14 of 47 positions shown, 15 images · non-contrast
Comparison: CT head without contrast 08/23/2018 CTA head and neck
02/10/2017

CLINICAL DATA: Unwitnessed fall. Patient found on floor. Left
forehead hematoma. Maxillofacial trauma, blunt. Initial encounter

EXAM:
CT HEAD WITHOUT CONTRAST
CT CERVICAL SPINE WITHOUT CONTRAST
TECHNIQUE: Multidetector CT imaging of the head and cervical spine was
performed following the standard protocol without intravenous
contrast. Multiplanar CT image reconstructions of the cervical spine
were also generated.

[Series 4: head without · axial · non-contrast · 0.41mm/px · z∈[-94,+61]mm · 3 of 32 slices shown, 4 images]
[im 1/32  brain]
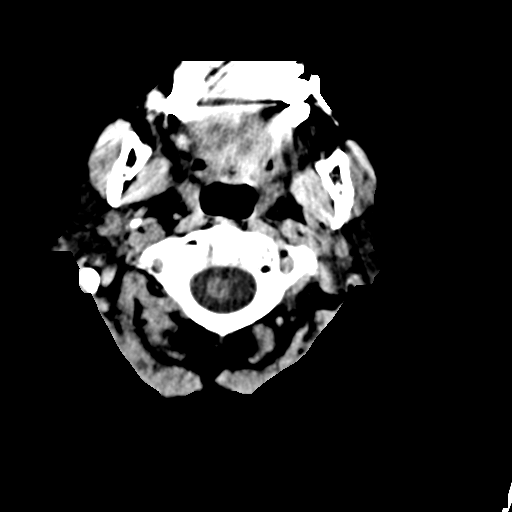
[im 1/32  bone]
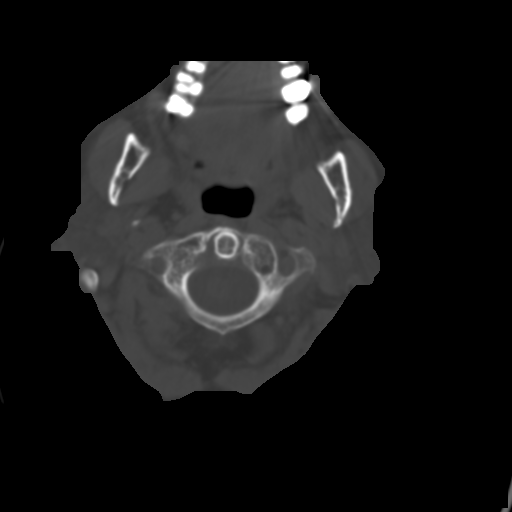
[im 16/32  brain]
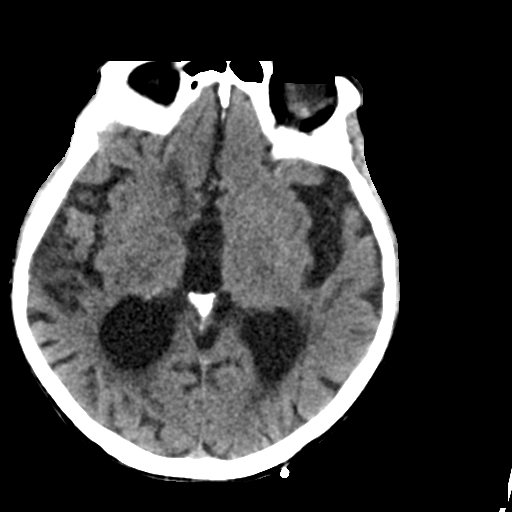
[im 32/32  brain]
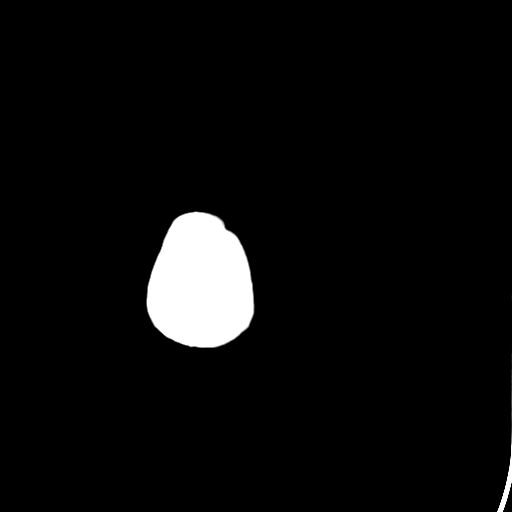

[Series 5: head bone · axial · 0.41mm/px · z∈[-72,+48]mm · 6 of 84 slices shown]
[im 12/84  bone]
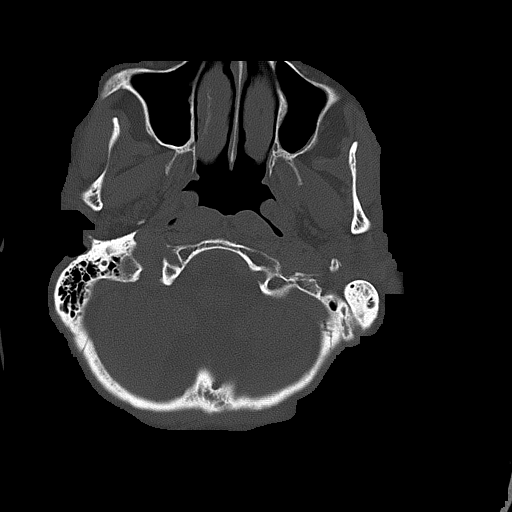
[im 24/84  bone]
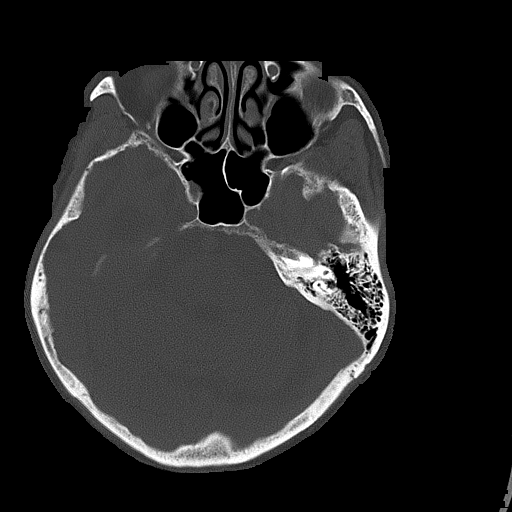
[im 36/84  bone]
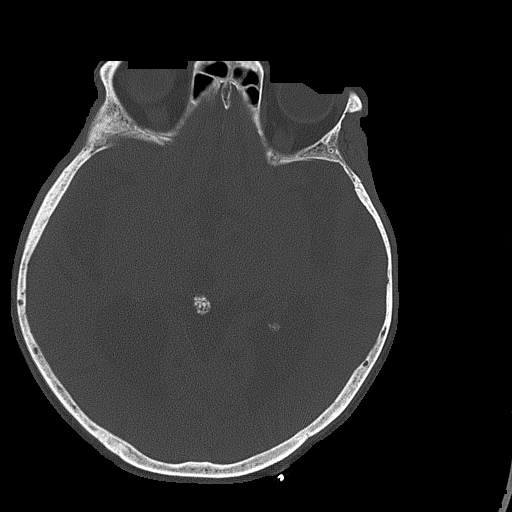
[im 48/84  bone]
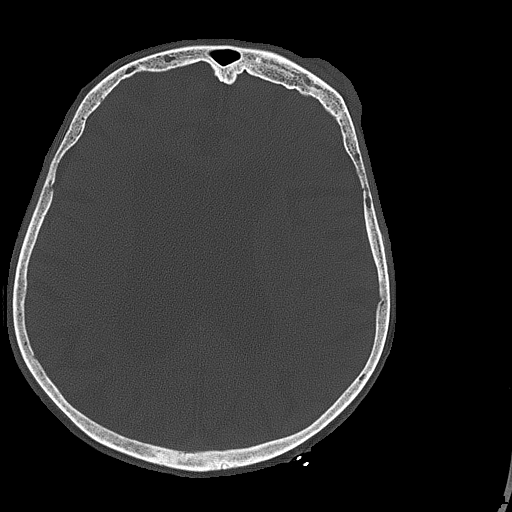
[im 60/84  bone]
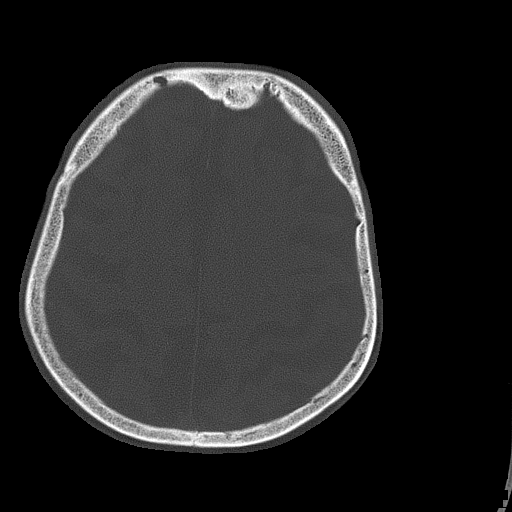
[im 72/84  bone]
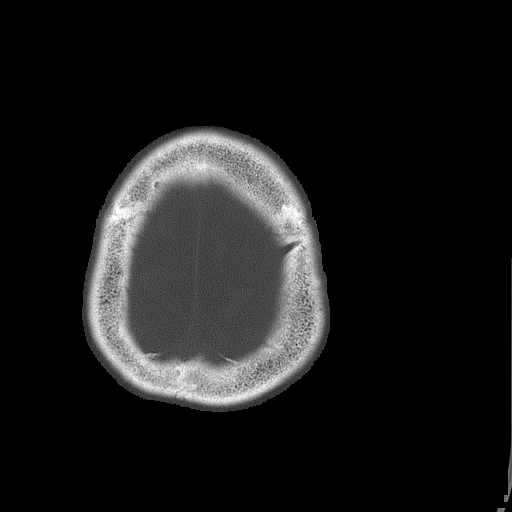

[Series 6: head without cor · coronal · non-contrast · 0.33mm/px · 3 of 73 slices shown]
[im 28/73  brain]
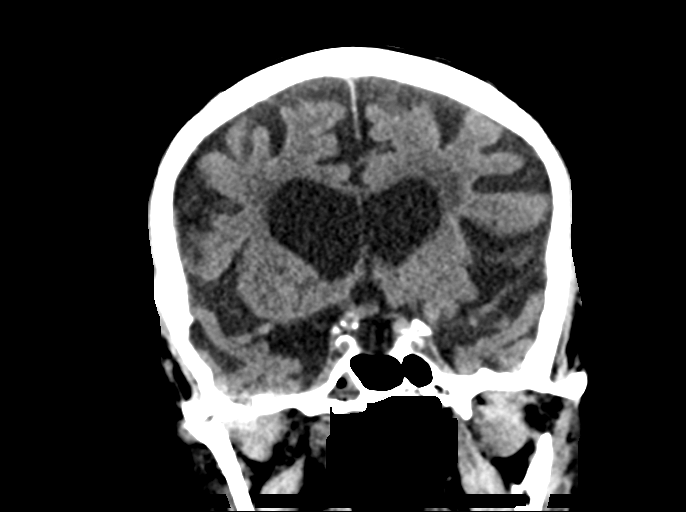
[im 37/73  brain]
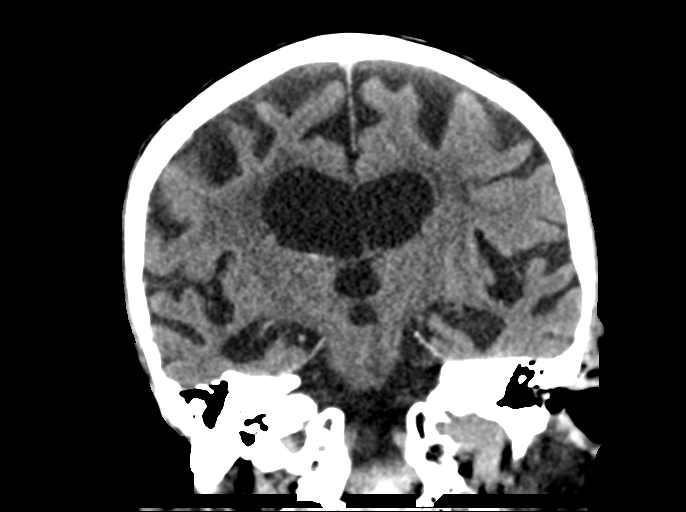
[im 46/73  brain]
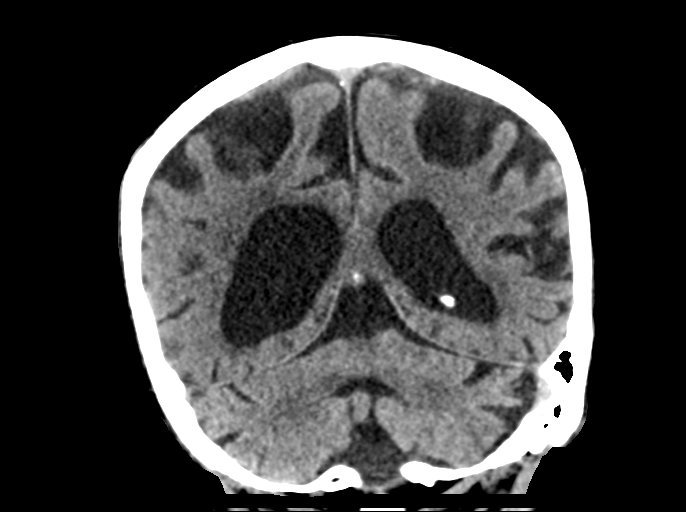

[Series 7: head without sag · sagittal · non-contrast · 0.33mm/px · 2 of 64 slices shown]
[im 22/64  brain]
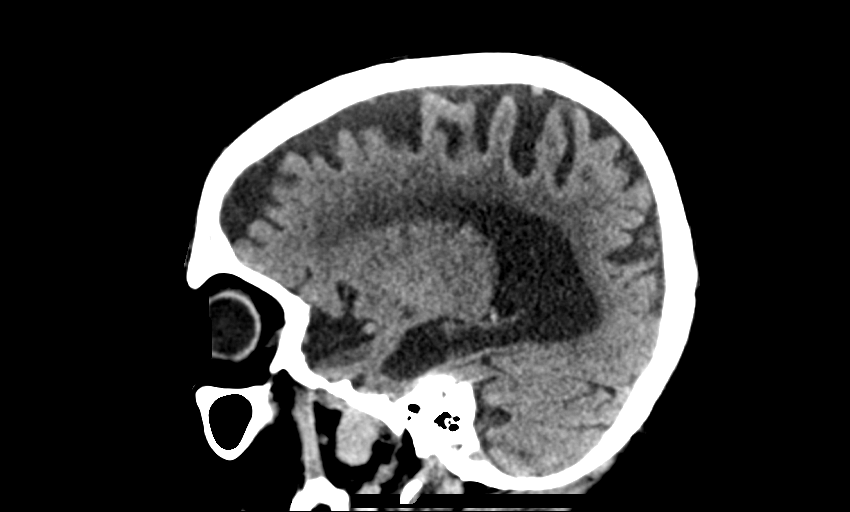
[im 43/64  brain]
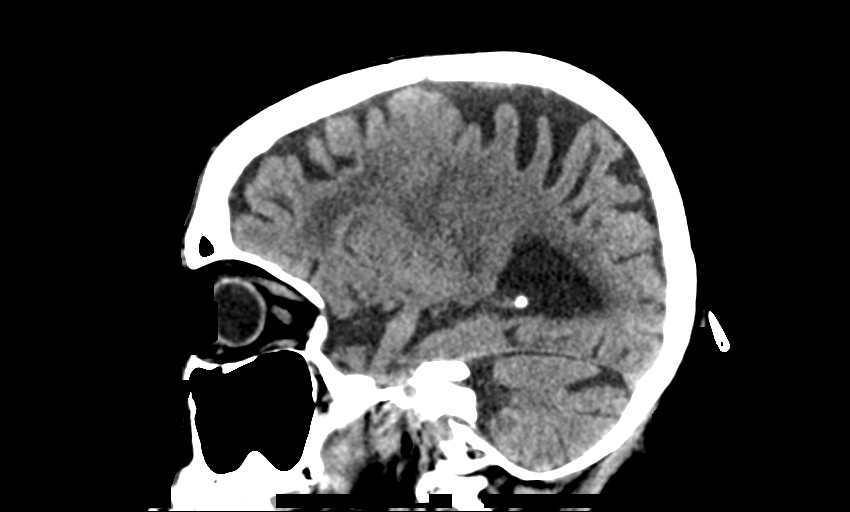

[14 of 47 positions shown; findings below may reference images not displayed]

FINDINGS: CT HEAD FINDINGS

Brain: Advanced atrophy and white matter disease is stable. No acute
infarct, hemorrhage, or mass lesion is present. The ventricles are
proportionate to the degree of atrophy. No significant extraaxial
fluid collection is present. Brainstem and cerebellum are
unremarkable

Vascular: Atherosclerotic calcifications are present within the
cavernous internal carotid arteries bilaterally. There is no
hyperdense vessel.

Skull: Left supraorbital frontal scalp hematoma is noted. There is
no underlying fracture. No radiopaque foreign body is present.
Chronic lytic lesion in the left paramedian occipital skull is
stable. No other focal lytic or blastic lesions are present.

Sinuses/Orbits: Fluid level is present in the lateral recess of the
right sphenoid sinus. The paranasal sinuses and mastoid air cells
are otherwise clear. Bilateral lens replacements are present. Globes
and orbits are otherwise normal.

CT CERVICAL SPINE FINDINGS

Alignment: Slight retrolisthesis is present at C4-5. AP alignment is
otherwise anatomic. Leftward curvature is centered at C5-6.

Skull base and vertebrae: The craniocervical junction is normal.
There is chronic fusion at C2-3 and C3-4. Uncovertebral spurring
contributes to foraminal narrowing at C4-5, C5-6, and C6-7. Acute or
healing fractures are present. A remote superior endplate T1
fracture is noted without retropulsed bone.

Soft tissues and spinal canal: No prevertebral fluid or swelling. No
visible canal hematoma.

Disc levels: Foraminal narrowing is associated with uncovertebral
spurring.

Upper chest: The lung apices are clear. Thoracic inlet is within
normal limits.
IMPRESSION: 1. Left supraorbital scalp hematoma without underlying fracture.
2. Stable advanced atrophy and white matter disease. No acute
intracranial abnormality.
3. Chronic degenerative changes of the cervical spine without
evidence for acute fracture.
4. Acquired fusion at C2-3 and C3-4.
5. Atherosclerosis

## 2021-05-03 ENCOUNTER — Emergency Department (HOSPITAL_COMMUNITY): Payer: Medicare Other

## 2021-05-03 ENCOUNTER — Encounter (HOSPITAL_COMMUNITY): Payer: Self-pay

## 2021-05-03 ENCOUNTER — Emergency Department (HOSPITAL_COMMUNITY)
Admission: EM | Admit: 2021-05-03 | Discharge: 2021-05-04 | Disposition: A | Discharge status: Home / Self Care | Payer: Medicare Other | Attending: Emergency Medicine | Admitting: Emergency Medicine

## 2021-05-03 DIAGNOSIS — S0083XA Contusion of other part of head, initial encounter: Secondary | ICD-10-CM | POA: Insufficient documentation

## 2021-05-03 DIAGNOSIS — W19XXXA Unspecified fall, initial encounter: Secondary | ICD-10-CM

## 2021-05-03 DIAGNOSIS — Z7982 Long term (current) use of aspirin: Secondary | ICD-10-CM | POA: Insufficient documentation

## 2021-05-03 DIAGNOSIS — S0990XA Unspecified injury of head, initial encounter: Secondary | ICD-10-CM | POA: Diagnosis present

## 2021-05-03 DIAGNOSIS — Z96641 Presence of right artificial hip joint: Secondary | ICD-10-CM | POA: Diagnosis not present

## 2021-05-03 DIAGNOSIS — Z91040 Latex allergy status: Secondary | ICD-10-CM | POA: Diagnosis not present

## 2021-05-03 DIAGNOSIS — I10 Essential (primary) hypertension: Secondary | ICD-10-CM | POA: Diagnosis not present

## 2021-05-03 DIAGNOSIS — F0390 Unspecified dementia without behavioral disturbance: Secondary | ICD-10-CM | POA: Diagnosis not present

## 2021-05-03 DIAGNOSIS — Z79899 Other long term (current) drug therapy: Secondary | ICD-10-CM | POA: Insufficient documentation

## 2021-05-03 MED ORDER — ACETAMINOPHEN 325 MG PO TABS
650.0000 mg | ORAL_TABLET | Freq: Once | ORAL | Status: AC
  Administered 2021-05-03: 650 mg via ORAL
  Filled 2021-05-03: qty 2

## 2021-05-03 MED ORDER — LORAZEPAM 0.5 MG PO TABS
0.5000 mg | ORAL_TABLET | Freq: Once | ORAL | Status: AC
  Administered 2021-05-03: 0.5 mg via ORAL
  Filled 2021-05-03: qty 1

## 2021-05-03 NOTE — Discharge Instructions (Signed)
It was a pleasure taking care of you today. Your son did not want any CT scans of your head. You may take tylenol as needed for pain. Follow-up with PCP within 1 week for further evaluation. Return to the ER for new or worsening symptoms  Per son, he would not like patient hospitalized and said he has paperwork on this?

## 2021-05-03 NOTE — ED Notes (Signed)
Pt pulling off clothing in room.  Clothing placed back on pt.  Pt medicated with ativan per orders for scan.

## 2021-05-03 NOTE — ED Triage Notes (Signed)
Pt had a fall unwitnessed at [facility], pt alert, was getting out of her wheelchair,  pt has a contusion  to right side of forehead, pt is NOT on blood thinners per EMS.  Pt is reported at her baseline.  Pt would not tolerate [facility], no ambulatory.

## 2021-05-03 NOTE — ED Provider Notes (Signed)
[facility] DEPT Provider Note   CSN: [id number] Arrival date & time: 05/03/21  1410     History No chief complaint on file.   Pamela Callahan is a 87 y.o. female with a past medical history significant for depression, GERD, hyperlipidemia, hypertension, history of seizures, history of dementia who presents to the ED after an unwitnessed fall.  Spoke to [facility] [employer] who notes patient fell out of her wheelchair. Fall was unwitnessed. Patient is not currently on any blood thinners. She has a hematoma on the right side of her forehead after the fall. Per [facility], she is at baseline. No other concerns. No treatment prior to arrival.  Level 5 caveat secondary to history of dementia.  History obtained from patient, Lovena Neighbours, son, and past medical records. No interpreter used during encounter.       Past Medical History:  Diagnosis Date   Allergy    Anxiety    Depression    Diverticulitis    GERD (gastroesophageal reflux disease)    Hyperlipidemia    Hypertension    Low back pain    Macular degeneration of left eye    Osteopenia    PAC (premature atrial contraction)     Patient Active Problem List   Diagnosis Date Noted   Gastrointestinal hemorrhage    Advanced care planning/counseling discussion    Goals of care, counseling/discussion    Palliative care by specialist    Symptomatic anemia    Syncope and collapse 08/23/2018   Iron deficiency anemia due to chronic blood loss 08/23/2018   Lactic acidosis 08/23/2018   Seizure ([place]) 08/23/2018   Severe dementia ([place]) 02/12/2017   Fall 02/11/2017   Depression with anxiety 02/11/2017   Closed L2 vertebral fracture ([place]) 02/11/2017   Traumatic epidural hematoma 02/11/2017   Memory loss 03/26/2016   Osteoarthritis of left knee 12/25/2015   Hearing loss of aging 12/25/2015   Cough 08/06/2015   Leg weakness, bilateral 07/01/2015   Subacute confusional state 07/01/2015   B12  deficiency 09/17/2014   Hyponatremia 08/30/2014   Fatigue 08/30/2014   Hip hematoma, right 06/12/2014   Rash and nonspecific skin eruption 03/12/2014   PAC (premature atrial contraction) 03/23/2013   Heart murmur 03/06/2013   RBBB (right bundle branch block) 03/06/2013   NEOPLASM OF UNCERTAIN BEHAVIOR OF SKIN 09/09/2010   SOLAR KERATOSIS 03/24/2010   ALLERGIC RHINITIS 09/26/2008   Dyslipidemia 01/02/2008   Anxiety state 01/02/2008   GERD 01/02/2008   Situational depression 09/26/2007   Essential hypertension 09/26/2007   OSTEOARTHRITIS 09/26/2007   LOW BACK PAIN 09/26/2007   OSTEOPENIA 09/26/2007    Past Surgical History:  Procedure Laterality Date   BACK SURGERY     CHOLECYSTECTOMY  2006   TOTAL HIP ARTHROPLASTY  2008   Right     OB History   No obstetric history on file.     Family History  Problem Relation Age of Onset   Hypertension Mother    Hypertension Other     Social History   Tobacco Use   Smoking status: Never   Smokeless tobacco: Never  Substance Use Topics   Alcohol use: No   Drug use: No    Home Medications Prior to Admission medications   Medication Sig Start Date End Date Taking? Authorizing Provider  acetaminophen (TYLENOL) 500 MG tablet Take 1,000 mg by mouth 3 (three) times daily.   Yes [provider]  aspirin 81 MG chewable tablet Chew 81 mg by  mouth daily.   Yes [provider]  colestipol (COLESTID) 5 g packet Take 5 g by mouth daily.   Yes [provider]  donepezil (ARICEPT) 10 MG tablet Take 1 tablet (10 mg total) by mouth at bedtime. 04/14/16  Yes Plotnikov, Evie Lacks, MD  Ensure (ENSURE) Take 237 mLs by mouth 2 (two) times daily as needed (to take medications and/or loss of appetite).   Yes [provider]  escitalopram (LEXAPRO) 20 MG tablet Take 20 mg by mouth daily.   Yes [provider]  ferrous sulfate 325 (65 FE) MG tablet Take 325 mg by mouth daily with breakfast.   Yes [provider]  fexofenadine (ALLEGRA) 60 MG tablet Take 60 mg by mouth daily.   Yes [provider]  hydrochlorothiazide (HYDRODIURIL) 25 MG tablet Take 25 mg by mouth daily.   Yes [provider]  hydrOXYzine (ATARAX/VISTARIL) 25 MG tablet Take 25 mg by mouth in the morning and at bedtime.   Yes [provider]  loperamide (IMODIUM A-D) 2 MG tablet Take 1 tablet (2 mg total) by mouth 3 (three) times daily as needed for diarrhea or loose stools. 08/25/18  Yes Thurnell Lose, MD  LORazepam (ATIVAN) 0.5 MG tablet Take 0.5 mg by mouth daily. At 1400   Yes [provider]  magnesium gluconate (MAGONATE) 500 MG tablet Take 500 mg by mouth daily.   Yes [provider]  Multiple Vitamins-Minerals (PRESERVISION AREDS 2) CHEW Chew 1 tablet by mouth every evening.   Yes [provider]  olmesartan (BENICAR) 20 MG tablet Take 1 tablet (20 mg total) by mouth daily. 03/26/16  Yes Plotnikov, Evie Lacks, MD  polyethylene glycol (MIRALAX / GLYCOLAX) packet Take 17 g by mouth daily. 08/25/18  Yes Thurnell Lose, MD  vitamin B-12 (CYANOCOBALAMIN) 1000 MCG tablet Take 1 tablet (1,000 mcg total) by mouth daily. 12/25/15  Yes Plotnikov, Evie Lacks, MD    Allergies    Latex, Ace inhibitors, Penicillins, Red dye, and Yellow dye  Review of Systems   Review of Systems  Unable to perform ROS: Dementia   Physical Exam Updated Vital Signs BP (!) 170/98   Pulse 85   Temp 98.2 F (36.8 C) (Axillary)   Resp (!) 21   Ht 5\' 8"  (1.727 m)   Wt 49 kg   SpO2 95%   BMI 16.43 kg/m   Physical Exam Vitals and nursing note reviewed.  Constitutional:      General: She is not in acute distress.    Appearance: She is not ill-appearing.  HENT:     Head: Normocephalic.     Comments: Large hematoma above right eye. No skull deformities or scalp lacerations. PERRL.  Eyes:     Pupils: Pupils are equal, round, and reactive to light.  Cardiovascular:     Rate and  Rhythm: Normal rate and regular rhythm.     Pulses: Normal pulses.     Heart sounds: Normal heart sounds. No murmur heard.   No friction rub. No gallop.  Pulmonary:     Effort: Pulmonary effort is normal.     Breath sounds: Normal breath sounds.  Abdominal:     General: Abdomen is flat. There is no distension.     Palpations: Abdomen is soft.     Tenderness: There is no abdominal tenderness. There is no guarding or rebound.  Musculoskeletal:        General: Normal range of motion.  Cervical back: Neck supple.     Comments: Patient able to move all 4 extremities without difficulty. No bony tenderness throughout bilateral hips.   Skin:    General: Skin is warm and dry.  Neurological:     General: No focal deficit present.     Mental Status: She is alert.  Psychiatric:        Mood and Affect: Mood normal.        Behavior: Behavior normal.    ED Results / Procedures / Treatments   Labs (all labs ordered are listed, but only abnormal results are displayed) Labs Reviewed - No data to display  EKG None  Radiology No results found.  Procedures Procedures   Medications Ordered in ED Medications  LORazepam (ATIVAN) tablet 0.5 mg (0.5 mg Oral Given 05/03/21 1550)    ED Course  I have reviewed the triage vital signs and the nursing notes.  Pertinent labs & imaging results that were available during my care of the patient were reviewed by me and considered in my medical decision making (see chart for details).  Clinical Course as of 05/03/21 1650  Sun May 03, 2021  1551 87 yo with dementia presenting from nursing facility with fall from wheelchair, large hematoma on forehead, pt pleasantly demented, no further complaints.  Pending CT imaging - pt initially would not tolerate lying on CT table, ativan given, will reattempt [MT]    Clinical Course User Index [MT] Trifan, Carola Rhine, MD   MDM Rules/Calculators/A&P                           87 year old female presents to  the ED after an unwitnessed fall out of her wheelchair.  [facility] is a [facility].  She is not currently on blood thinners.  Patient has a history of dementia.  Upon arrival, vitals all within normal limits.  Patient in no acute distress.  Large hematoma above right thigh.  No hyphema.  No bony tenderness to bilateral hips.  Patient is wheelchair-bound.  CT head, maxillofacial, and cervical spine ordered.  4:26 PM Spoke to patient's son who does not want any images obtained. He notes he spoke to palliative care during one of her recent admissions and does not want her hospitalized. Discussed need to follow-up on paperwork. Patient stable for discharge. Strict ED precautions discussed with patient. Patient states understanding and agrees to plan. Patient discharged home in no acute distress and stable vitals  Discussed with Dr. Langston Masker who evaluated patient at bedside and agrees with assessment and plan.  Final Clinical Impression(s) / ED Diagnoses Final diagnoses:  Fall, initial encounter    Rx / DC Orders ED Discharge Orders     None        Karie Kirks 05/03/21 1650    Wyvonnia Dusky, MD 05/03/21 [phone]

## 2021-05-03 NOTE — ED Notes (Signed)
Pt's diaper changed.

## 2021-11-10 ENCOUNTER — Encounter (HOSPITAL_COMMUNITY): Payer: Self-pay

## 2021-11-10 ENCOUNTER — Emergency Department (HOSPITAL_COMMUNITY)
Admission: EM | Admit: 2021-11-10 | Discharge: 2021-11-10 | Disposition: A | Discharge status: Skilled Nursing Facility | Attending: Emergency Medicine | Admitting: Emergency Medicine

## 2021-11-10 ENCOUNTER — Other Ambulatory Visit: Payer: Self-pay

## 2021-11-10 DIAGNOSIS — Z91040 Latex allergy status: Secondary | ICD-10-CM | POA: Insufficient documentation

## 2021-11-10 DIAGNOSIS — Z7982 Long term (current) use of aspirin: Secondary | ICD-10-CM | POA: Diagnosis not present

## 2021-11-10 DIAGNOSIS — W07XXXA Fall from chair, initial encounter: Secondary | ICD-10-CM | POA: Diagnosis not present

## 2021-11-10 DIAGNOSIS — F0390 Unspecified dementia without behavioral disturbance: Secondary | ICD-10-CM | POA: Insufficient documentation

## 2021-11-10 DIAGNOSIS — S6992XA Unspecified injury of left wrist, hand and finger(s), initial encounter: Secondary | ICD-10-CM | POA: Diagnosis present

## 2021-11-10 DIAGNOSIS — S61223A Laceration with foreign body of left middle finger without damage to nail, initial encounter: Secondary | ICD-10-CM | POA: Insufficient documentation

## 2021-11-10 DIAGNOSIS — S61213A Laceration without foreign body of left middle finger without damage to nail, initial encounter: Secondary | ICD-10-CM

## 2021-11-10 DIAGNOSIS — Z79899 Other long term (current) drug therapy: Secondary | ICD-10-CM | POA: Diagnosis not present

## 2021-11-10 DIAGNOSIS — S51012A Laceration without foreign body of left elbow, initial encounter: Secondary | ICD-10-CM | POA: Insufficient documentation

## 2021-11-10 NOTE — Discharge Instructions (Signed)
Pamela Callahan's wounds were dressed with xeroform bandages, gauze and coband to protect the bandages.  Leave these dressings in place for the next 7 days.  Afterwards you should remove them, you can apply bacitracin or A&D ointment to her left 3rd finger as needed, and try to keep it wrapped.  Unfortunately with her age, skin friability, and uncooperative nature due to dementia, this injury is not amenable to suture closure, and will need to heal by secondary intention. ?

## 2021-11-10 NOTE — ED Notes (Signed)
Pt left elbow, forearm and left hand bandaged and wrapped per provider order. ?

## 2021-11-10 NOTE — ED Provider Notes (Signed)
?[facility] ?Provider Note ? ? ?CSN: [id number] ?Arrival date & time: 11/10/21  1550 ? ?  ? ?History ? ?Chief Complaint  ?Patient presents with  ? Fall  ? ? ?Pamela Callahan is a 88 y.o. female with a history of severe dementia, DNR, on hospice care, presenting by EMS from [facility] with concern from a fall from the wheelchair and a skin tear to the left elbow and left hand.  The patient is unable to provide any history, it is repeatedly making the same noise. ? ?HPI ? ?  ? ?Home Medications ?Prior to Admission medications   ?Medication Sig Start Date End Date Taking? Authorizing Provider  ?acetaminophen (TYLENOL) 500 MG tablet Take 1,000 mg by mouth 3 (three) times daily.    [provider]  ?aspirin 81 MG chewable tablet Chew 81 mg by mouth daily.    [provider]  ?colestipol (COLESTID) 5 g packet Take 5 g by mouth daily.    [provider]  ?donepezil (ARICEPT) 10 MG tablet Take 1 tablet (10 mg total) by mouth at bedtime. 04/14/16   Plotnikov, Evie Lacks, MD  ?Ensure (ENSURE) Take 237 mLs by mouth 2 (two) times daily as needed (to take medications and/or loss of appetite).    [provider]  ?escitalopram (LEXAPRO) 20 MG tablet Take 20 mg by mouth daily.    [provider]  ?ferrous sulfate 325 (65 FE) MG tablet Take 325 mg by mouth daily with breakfast.    [provider]  ?fexofenadine (ALLEGRA) 60 MG tablet Take 60 mg by mouth daily.    [provider]  ?hydrochlorothiazide (HYDRODIURIL) 25 MG tablet Take 25 mg by mouth daily.    [provider]  ?hydrOXYzine (ATARAX/VISTARIL) 25 MG tablet Take 25 mg by mouth in the morning and at bedtime.    [provider]  ?loperamide (IMODIUM A-D) 2 MG tablet Take 1 tablet (2 mg total) by mouth 3 (three) times daily as needed for diarrhea or loose stools. 08/25/18   Thurnell Lose, MD  ?LORazepam (ATIVAN) 0.5 MG tablet Take 0.5 mg by  mouth daily. At 1400    [provider]  ?magnesium gluconate (MAGONATE) 500 MG tablet Take 500 mg by mouth daily.    [provider]  ?Multiple Vitamins-Minerals (PRESERVISION AREDS 2) CHEW Chew 1 tablet by mouth every evening.    [provider]  ?olmesartan (BENICAR) 20 MG tablet Take 1 tablet (20 mg total) by mouth daily. 03/26/16   Plotnikov, Evie Lacks, MD  ?polyethylene glycol (MIRALAX / GLYCOLAX) packet Take 17 g by mouth daily. 08/25/18   Thurnell Lose, MD  ?vitamin B-12 (CYANOCOBALAMIN) 1000 MCG tablet Take 1 tablet (1,000 mcg total) by mouth daily. 12/25/15   Plotnikov, Evie Lacks, MD  ?   ? ?Allergies    ?Latex, Ace inhibitors, Penicillins, Red dye, and Yellow dye   ? ?Review of Systems   ?Review of Systems ? ?Physical Exam ?Updated Vital Signs ?BP 129/89 (BP Location: Right Arm)   Pulse 76   Temp 98.6 ?F (37 ?C) (Axillary)   Resp 18   Ht '5\' 8"'$  (1.727 m)   Wt 49 kg   SpO2 97%   BMI 16.42 kg/m?  ?Physical Exam ?HENT:  ?   Head: Normocephalic and atraumatic.  ?Eyes:  ?   Conjunctiva/sclera: Conjunctivae normal.  ?   Pupils: Pupils are equal, round, and reactive to light.  ?Cardiovascular:  ?  Rate and Rhythm: Normal rate and regular rhythm.  ?Pulmonary:  ?   Effort: Pulmonary effort is normal. No respiratory distress.  ?Musculoskeletal:  ?   Comments: Skin tear involving the left elbow, full range of motion of left elbow, deeper skin tear and small laceration involving the left 3rd digit just beyond PIP  ?Skin: ?   General: Skin is warm and dry.  ?Neurological:  ?   General: No focal deficit present.  ?   Mental Status: She is alert. Mental status is at baseline.  ? ? ?ED Results / Procedures / Treatments   ?Labs ?(all labs ordered are listed, but only abnormal results are displayed) ?Labs Reviewed - No data to display ? ?EKG ?None ? ?Radiology ?No results found. ? ?Procedures ?Procedures  ? ? ?Medications Ordered in ED ?Medications - No data to display ? ?ED Course/  Medical Decision Making/ A&P ?Clinical Course as of 11/10/21 1915  ?Tue Nov 10, 2021  ?1614 I spoke and updated the patient's son by phone. [MT]  ?  ?Clinical Course User Index ?[MT] Wyvonnia Dusky, MD  ? ?                        ?Medical Decision Making ? ?Wounds can be cleaned, dressed with Xeroform and Coban.  These injuries are not amenable to laceration repair.  The cut on her third proximal digit is deeper, exposed underlying tendon, but given her friable skin and the fact the patient is completely uncooperative due to her dementia, this is not to be amenable to suture closure.  We will keep it dressed at home and instructions were provided.  I updated the patient's son by phone. ? ? ? ? ? ? ? ?Final Clinical Impression(s) / ED Diagnoses ?Final diagnoses:  ?Skin tear of left elbow without complication, initial encounter  ?Laceration of left middle finger, foreign body presence unspecified, nail damage status unspecified, initial encounter  ? ? ?Rx / DC Orders ?ED Discharge Orders   ? ? None  ? ?  ? ? ?  ?Wyvonnia Dusky, MD ?11/10/21 1915 ? ?

## 2021-11-10 NOTE — ED Triage Notes (Signed)
[facility] reports pt coming from [facility] facility, DNR in hand. Pt had unwitnessed fall from wheelchair. Pt with skin tear to left elbow and left hand that hospice nurse could not manage and wanted pt sent out for. Pt at her baseline, hx of dementia. ?
